# Patient Record
Sex: Male | Born: 1935 | Race: Black or African American | Hispanic: No | Marital: Married | Smoking: Former smoker
Health system: Southern US, Community
[De-identification: ages and names within clinical notes are randomized; demographics above are authoritative.]

## PROBLEM LIST (undated history)

## (undated) DIAGNOSIS — Z923 Personal history of irradiation: Secondary | ICD-10-CM

## (undated) DIAGNOSIS — I10 Essential (primary) hypertension: Secondary | ICD-10-CM

## (undated) DIAGNOSIS — K219 Gastro-esophageal reflux disease without esophagitis: Secondary | ICD-10-CM

## (undated) DIAGNOSIS — K08109 Complete loss of teeth, unspecified cause, unspecified class: Secondary | ICD-10-CM

## (undated) DIAGNOSIS — Z8546 Personal history of malignant neoplasm of prostate: Secondary | ICD-10-CM

## (undated) DIAGNOSIS — K222 Esophageal obstruction: Secondary | ICD-10-CM

## (undated) DIAGNOSIS — Z972 Presence of dental prosthetic device (complete) (partial): Secondary | ICD-10-CM

## (undated) DIAGNOSIS — J398 Other specified diseases of upper respiratory tract: Secondary | ICD-10-CM

## (undated) DIAGNOSIS — Z8521 Personal history of malignant neoplasm of larynx: Secondary | ICD-10-CM

## (undated) DIAGNOSIS — H269 Unspecified cataract: Secondary | ICD-10-CM

## (undated) HISTORY — DX: Gastro-esophageal reflux disease without esophagitis: K21.9

## (undated) HISTORY — PX: LIPOMA EXCISION: SHX5283

## (undated) HISTORY — PX: ESOPHAGEAL DILATION: SHX303

## (undated) HISTORY — PX: TRACHEAL ESOPHAGEAL PROSTHESIS (TEP) CHANGE: SHX6131

## (undated) HISTORY — PX: DIRECT LARYNGOSCOPY: SHX5326

## (undated) HISTORY — PX: TONSILLECTOMY: SUR1361

---

## 2003-09-28 ENCOUNTER — Inpatient Hospital Stay (HOSPITAL_COMMUNITY): Admission: EM | Admit: 2003-09-28 | Discharge: 2003-09-29 | Payer: Self-pay | Admitting: Emergency Medicine

## 2005-04-29 HISTORY — PX: PROSTATE BIOPSY: SHX241

## 2005-07-08 ENCOUNTER — Ambulatory Visit: Payer: Self-pay | Admitting: Internal Medicine

## 2005-10-29 ENCOUNTER — Ambulatory Visit: Admission: RE | Admit: 2005-10-29 | Discharge: 2005-11-29 | Payer: Self-pay | Admitting: Radiation Oncology

## 2006-07-04 ENCOUNTER — Ambulatory Visit: Payer: Self-pay | Admitting: Internal Medicine

## 2007-01-08 ENCOUNTER — Encounter: Admission: RE | Admit: 2007-01-08 | Discharge: 2007-01-08 | Payer: Self-pay | Admitting: Otolaryngology

## 2007-01-14 ENCOUNTER — Ambulatory Visit (HOSPITAL_COMMUNITY): Admission: RE | Admit: 2007-01-14 | Discharge: 2007-01-14 | Payer: Self-pay | Admitting: Otolaryngology

## 2007-02-06 ENCOUNTER — Ambulatory Visit: Payer: Self-pay | Admitting: Internal Medicine

## 2007-02-06 ENCOUNTER — Ambulatory Visit: Admission: RE | Admit: 2007-02-06 | Discharge: 2007-04-03 | Payer: Self-pay | Admitting: Radiation Oncology

## 2007-02-23 ENCOUNTER — Ambulatory Visit (HOSPITAL_BASED_OUTPATIENT_CLINIC_OR_DEPARTMENT_OTHER): Admission: RE | Admit: 2007-02-23 | Discharge: 2007-02-23 | Payer: Self-pay | Admitting: Otolaryngology

## 2007-02-23 ENCOUNTER — Encounter (INDEPENDENT_AMBULATORY_CARE_PROVIDER_SITE_OTHER): Payer: Self-pay | Admitting: Otolaryngology

## 2007-04-10 ENCOUNTER — Encounter (INDEPENDENT_AMBULATORY_CARE_PROVIDER_SITE_OTHER): Payer: Self-pay | Admitting: Otolaryngology

## 2007-04-10 ENCOUNTER — Ambulatory Visit (HOSPITAL_BASED_OUTPATIENT_CLINIC_OR_DEPARTMENT_OTHER): Admission: RE | Admit: 2007-04-10 | Discharge: 2007-04-10 | Payer: Self-pay | Admitting: Otolaryngology

## 2007-08-20 ENCOUNTER — Encounter: Payer: Self-pay | Admitting: Internal Medicine

## 2007-08-20 DIAGNOSIS — K219 Gastro-esophageal reflux disease without esophagitis: Secondary | ICD-10-CM | POA: Insufficient documentation

## 2007-08-20 DIAGNOSIS — N400 Enlarged prostate without lower urinary tract symptoms: Secondary | ICD-10-CM

## 2007-08-31 ENCOUNTER — Encounter: Payer: Self-pay | Admitting: Internal Medicine

## 2007-09-17 DIAGNOSIS — Z8521 Personal history of malignant neoplasm of larynx: Secondary | ICD-10-CM

## 2007-09-17 HISTORY — DX: Personal history of malignant neoplasm of larynx: Z85.21

## 2007-09-18 ENCOUNTER — Encounter: Payer: Self-pay | Admitting: Internal Medicine

## 2007-09-29 ENCOUNTER — Ambulatory Visit (HOSPITAL_COMMUNITY): Admission: RE | Admit: 2007-09-29 | Discharge: 2007-09-29 | Payer: Self-pay | Admitting: Otolaryngology

## 2007-10-28 ENCOUNTER — Inpatient Hospital Stay (HOSPITAL_COMMUNITY): Admission: RE | Admit: 2007-10-28 | Discharge: 2007-11-04 | Payer: Self-pay | Admitting: Otolaryngology

## 2007-10-28 ENCOUNTER — Encounter (INDEPENDENT_AMBULATORY_CARE_PROVIDER_SITE_OTHER): Payer: Self-pay | Admitting: Otolaryngology

## 2007-10-28 HISTORY — PX: TOTAL LARYNGECTOMY: SHX2543

## 2008-01-01 ENCOUNTER — Encounter: Payer: Self-pay | Admitting: Internal Medicine

## 2008-03-17 ENCOUNTER — Ambulatory Visit (HOSPITAL_COMMUNITY): Admission: RE | Admit: 2008-03-17 | Discharge: 2008-03-17 | Payer: Self-pay | Admitting: Otolaryngology

## 2008-04-07 ENCOUNTER — Ambulatory Visit (HOSPITAL_COMMUNITY): Admission: RE | Admit: 2008-04-07 | Discharge: 2008-04-07 | Payer: Self-pay | Admitting: Otolaryngology

## 2008-04-07 HISTORY — PX: PLACEMENT OF TRACHEAL ESOPHAGEAL PROTHESIS, ESOPHAGOSCOPY WITH DILATION: SHX5564

## 2008-05-27 ENCOUNTER — Ambulatory Visit (HOSPITAL_COMMUNITY): Admission: RE | Admit: 2008-05-27 | Discharge: 2008-05-27 | Payer: Self-pay | Admitting: Otolaryngology

## 2008-06-13 ENCOUNTER — Encounter: Payer: Self-pay | Admitting: Internal Medicine

## 2008-06-13 DIAGNOSIS — I10 Essential (primary) hypertension: Secondary | ICD-10-CM

## 2008-06-13 DIAGNOSIS — Z87891 Personal history of nicotine dependence: Secondary | ICD-10-CM | POA: Insufficient documentation

## 2008-06-20 ENCOUNTER — Encounter: Admission: RE | Admit: 2008-06-20 | Discharge: 2008-06-20 | Payer: Self-pay | Admitting: Internal Medicine

## 2008-06-24 ENCOUNTER — Ambulatory Visit (HOSPITAL_BASED_OUTPATIENT_CLINIC_OR_DEPARTMENT_OTHER): Admission: RE | Admit: 2008-06-24 | Discharge: 2008-06-24 | Payer: Self-pay | Admitting: Otolaryngology

## 2008-07-27 ENCOUNTER — Encounter: Payer: Self-pay | Admitting: Internal Medicine

## 2008-08-26 ENCOUNTER — Ambulatory Visit: Payer: Self-pay | Admitting: Internal Medicine

## 2008-08-26 DIAGNOSIS — R1319 Other dysphagia: Secondary | ICD-10-CM | POA: Insufficient documentation

## 2008-08-29 DIAGNOSIS — K5730 Diverticulosis of large intestine without perforation or abscess without bleeding: Secondary | ICD-10-CM | POA: Insufficient documentation

## 2008-08-29 DIAGNOSIS — J309 Allergic rhinitis, unspecified: Secondary | ICD-10-CM | POA: Insufficient documentation

## 2008-08-29 DIAGNOSIS — F411 Generalized anxiety disorder: Secondary | ICD-10-CM | POA: Insufficient documentation

## 2008-08-29 DIAGNOSIS — E785 Hyperlipidemia, unspecified: Secondary | ICD-10-CM | POA: Insufficient documentation

## 2008-08-29 DIAGNOSIS — I10 Essential (primary) hypertension: Secondary | ICD-10-CM | POA: Insufficient documentation

## 2008-08-29 DIAGNOSIS — Z8546 Personal history of malignant neoplasm of prostate: Secondary | ICD-10-CM

## 2008-09-14 ENCOUNTER — Ambulatory Visit: Payer: Self-pay | Admitting: Internal Medicine

## 2009-01-13 ENCOUNTER — Telehealth: Payer: Self-pay | Admitting: Gastroenterology

## 2009-03-13 ENCOUNTER — Encounter: Payer: Self-pay | Admitting: Internal Medicine

## 2009-06-27 ENCOUNTER — Ambulatory Visit: Payer: Self-pay | Admitting: Internal Medicine

## 2009-09-25 ENCOUNTER — Encounter: Payer: Self-pay | Admitting: Internal Medicine

## 2009-10-03 ENCOUNTER — Ambulatory Visit (HOSPITAL_COMMUNITY): Admission: RE | Admit: 2009-10-03 | Discharge: 2009-10-03 | Payer: Self-pay | Admitting: Urology

## 2009-10-09 ENCOUNTER — Encounter: Admission: RE | Admit: 2009-10-09 | Discharge: 2009-10-09 | Payer: Self-pay | Admitting: Urology

## 2009-10-12 ENCOUNTER — Encounter: Payer: Self-pay | Admitting: Internal Medicine

## 2009-10-18 ENCOUNTER — Encounter: Admission: RE | Admit: 2009-10-18 | Discharge: 2009-10-18 | Payer: Self-pay | Admitting: Urology

## 2009-11-15 ENCOUNTER — Encounter: Payer: Self-pay | Admitting: Internal Medicine

## 2009-11-27 ENCOUNTER — Ambulatory Visit: Admission: RE | Admit: 2009-11-27 | Discharge: 2009-12-06 | Payer: Self-pay | Admitting: Radiation Oncology

## 2009-11-28 ENCOUNTER — Encounter: Payer: Self-pay | Admitting: Internal Medicine

## 2009-12-04 ENCOUNTER — Ambulatory Visit (HOSPITAL_BASED_OUTPATIENT_CLINIC_OR_DEPARTMENT_OTHER): Admission: RE | Admit: 2009-12-04 | Discharge: 2009-12-04 | Payer: Self-pay | Admitting: Otolaryngology

## 2010-05-23 ENCOUNTER — Encounter: Payer: Self-pay | Admitting: Internal Medicine

## 2010-06-26 ENCOUNTER — Ambulatory Visit: Payer: Self-pay | Admitting: Internal Medicine

## 2010-10-07 ENCOUNTER — Encounter: Payer: Self-pay | Admitting: Urology

## 2010-10-18 NOTE — Letter (Signed)
Summary: [facility]  [facility]   Imported By: Lennie Odor 10/20/2009 12:03:18  _____________________________________________________________________  External Attachment:    Type:   Image     Comment:   External Document

## 2010-10-18 NOTE — Letter (Signed)
[facility]   Imported By: Lester Rosedale 01/03/2010 10:47:11  _____________________________________________________________________  External Attachment:    Type:   Image     Comment:   External Document

## 2010-10-18 NOTE — Assessment & Plan Note (Signed)
Summary: FLU SHOT / [name]  Nurse Visit   Allergies: No Known Drug Allergies  Orders Added: 1)  Flu Vaccine 4yrs + MEDICARE PATIENTS [Q2039] 2)  Administration Flu vaccine - MCR [G0008] Flu Vaccine Consent Questions     Do you have a history of severe allergic reactions to this vaccine? no    Any prior history of allergic reactions to egg and/or gelatin? no    Do you have a sensitivity to the preservative Thimersol? no    Do you have a past history of Guillan-Barre Syndrome? no    Do you currently have an acute febrile illness? no    Have you ever had a severe reaction to latex? no    Vaccine information given and explained to patient? yes    Are you currently pregnant? no    Lot Number:[id number]   Exp Date:03/16/2011   Site Given  Left Deltoid IMistration Flu vaccine - MCR [G0008] .lbmedflu

## 2010-10-18 NOTE — Letter (Signed)
Summary: [facility]   Imported By: Sherian Rein 05/28/2010 15:01:09  _____________________________________________________________________  External Attachment:    Type:   Image     Comment:   External Document

## 2010-10-18 NOTE — Letter (Signed)
Summary: [facility]   Imported By: Lennie Odor 11/23/2009 14:09:44  _____________________________________________________________________  External Attachment:    Type:   Image     Comment:   External Document

## 2010-10-18 NOTE — Letter (Signed)
Summary: [facility]   Imported By: Sherian Rein 09/28/2009 16:46:46  _____________________________________________________________________  External Attachment:    Type:   Image     Comment:   External Document

## 2010-12-05 ENCOUNTER — Encounter (HOSPITAL_BASED_OUTPATIENT_CLINIC_OR_DEPARTMENT_OTHER)
Admission: RE | Admit: 2010-12-05 | Discharge: 2010-12-05 | Disposition: A | Discharge status: Home / Self Care | Payer: Medicare Other | Source: Ambulatory Visit | Attending: Otolaryngology | Admitting: Otolaryngology

## 2010-12-05 ENCOUNTER — Ambulatory Visit
Admission: RE | Admit: 2010-12-05 | Discharge: 2010-12-05 | Disposition: A | Discharge status: Home / Self Care | Payer: Medicare Other | Source: Ambulatory Visit | Attending: Otolaryngology | Admitting: Otolaryngology

## 2010-12-05 ENCOUNTER — Other Ambulatory Visit: Payer: Self-pay | Admitting: Otolaryngology

## 2010-12-05 DIAGNOSIS — Z01811 Encounter for preprocedural respiratory examination: Secondary | ICD-10-CM

## 2010-12-05 LAB — POCT I-STAT, CHEM 8
BUN: 13 mg/dL (ref 6–23)
Calcium, Ion: 1.18 mmol/L (ref 1.12–1.32)
Chloride: 105 mEq/L (ref 96–112)
HCT: 45 % (ref 39.0–52.0)
Hemoglobin: 15.3 g/dL (ref 13.0–17.0)
Sodium: 141 mEq/L (ref 135–145)
TCO2: 26 mmol/L (ref 0–100)

## 2010-12-06 ENCOUNTER — Ambulatory Visit (HOSPITAL_BASED_OUTPATIENT_CLINIC_OR_DEPARTMENT_OTHER)
Admission: RE | Admit: 2010-12-06 | Discharge: 2010-12-06 | Disposition: A | Discharge status: Home / Self Care | Payer: Medicare Other | Source: Ambulatory Visit | Attending: Otolaryngology | Admitting: Otolaryngology

## 2010-12-06 DIAGNOSIS — Y849 Medical procedure, unspecified as the cause of abnormal reaction of the patient, or of later complication, without mention of misadventure at the time of the procedure: Secondary | ICD-10-CM | POA: Insufficient documentation

## 2010-12-06 DIAGNOSIS — Z01810 Encounter for preprocedural cardiovascular examination: Secondary | ICD-10-CM | POA: Insufficient documentation

## 2010-12-06 DIAGNOSIS — T85698A Other mechanical complication of other specified internal prosthetic devices, implants and grafts, initial encounter: Secondary | ICD-10-CM | POA: Insufficient documentation

## 2010-12-06 DIAGNOSIS — K222 Esophageal obstruction: Secondary | ICD-10-CM | POA: Insufficient documentation

## 2010-12-06 DIAGNOSIS — Z462 Encounter for fitting and adjustment of other devices related to nervous system and special senses: Secondary | ICD-10-CM | POA: Insufficient documentation

## 2010-12-06 LAB — POCT HEMOGLOBIN-HEMACUE: Hemoglobin: 15.6 g/dL (ref 13.0–17.0)

## 2010-12-09 LAB — BASIC METABOLIC PANEL
BUN: 10 mg/dL (ref 6–23)
Calcium: 8.9 mg/dL (ref 8.4–10.5)
Chloride: 107 mEq/L (ref 96–112)
Creatinine, Ser: 0.99 mg/dL (ref 0.4–1.5)
GFR calc Af Amer: >60 mL/min (ref >60)
GFR calc non Af Amer: >60 mL/min (ref >60)

## 2010-12-09 LAB — POCT HEMOGLOBIN-HEMACUE: Hemoglobin: 14.8 g/dL (ref 13.0–17.0)

## 2011-01-09 NOTE — Op Note (Signed)
[name], [name]              ACCOUNT NO.:  [id number]  MEDICAL RECORD NO.:  [id number]          PATIENT TYPE:  LOCATION:                                 FACILITY:  PHYSICIAN:  Kristine Garbe. Ezzard Standing, M.D.DATE OF BIRTH:  22-Jan-1931  DATE OF PROCEDURE:  12/06/2010 DATE OF DISCHARGE:                              OPERATIVE REPORT   PREOPERATIVE DIAGNOSES: 1. Esophageal stricture. 2. Alaryngeal speech with tracheoesophageal puncture.  POSTOPERATIVE DIAGNOSES: 1. Esophageal stricture. 2. Alaryngeal speech with tracheoesophageal puncture.  OPERATION PERFORMED:  Cervical esophagoscopy with dilation of cervical esophagus to a #18 Savary dilator.  Placement of a 10-mm Provox 2 voice prosthesis.  SURGEON:  Kristine Garbe. Ezzard Standing, MD  ANESTHESIA:  General endotracheal.  COMPLICATIONS:  None.  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  He has been using a voice prosthesis for a couple years now.  More recently, he has had some increasing difficulty using the voice prosthesis.  This was attempted to be replaced and could not be repositioned.  He was having no real leak through the tracheoesophageal fistula as if the back wall of the tracheoesophageal fistula is closing up over the prosthesis and could not pass the red rubber catheter through the fistula site.  The patient is taken to the operating room at this time for esophageal dilation and replacement of a Provox voice prosthesis through the tracheoesophageal fistula.  DESCRIPTION OF PROCEDURE:  After adequate endotracheal anesthesia through his laryngeal stoma, esophagoscopy was performed.  The patient had a stricture at the opening of the esophagus that was dilated starting with Savary dilators, initially using the #12 Savary dilator which was a little tight and this was sequentially dilated up to a #18 Savary dilator.  At this time, I was able to easily  pass the cervical esophagoscope.  With the area of the previous fistula under visualization, a hemostat was passed through a small hole and the fistula was dilated slightly and then a #12 red rubber catheter was passed through the hole which was brought out through the esophagoscope and brought to the mouth.  Measurements of the thickness of the common wall was estimated to be between 8 and 10 mm.  It was elected to use the #10 mm Provox prosthesis as he previously had an 8 in.  The Provox 10-mm prosthesis was then used and passed retrograde through the fistula site. Following placement of the prosthesis, repeat cervical esophagoscopy was performed and the esophageal side of this prosthesis was in good position and was not obstructing.  Of note, prior to placing the Provox prosthesis, cervical esophagoscopy was performed distal to the dilation and the more distal esophagus was normal in appearance.  This completed the procedure.  [name] was awakened from anesthesia and transferred to recovery room postop doing well.  He received 1 g Ancef IV preoperatively.  He is discharged home later this morning.  DISPOSITION:  [name] was discharged home on amoxicillin suspension 500 mg b.i.d. for 1 week, Tylenol Lortab elixir 15 mL q.4 h p.r.n. pain.  He will follow up with me in  the office in 1 week for recheck.  Of note, his laryngeal stoma was on the smaller side and recommended using the Lary tube to stent the laryngeal stoma.  He will follow my office in 1 week for recheck.          ______________________________ Kristine Garbe. Ezzard Standing, M.D.     CEN/MEDQ  D:  12/06/2010  T:  12/07/2010  Job:  [id number]  cc:   Jeffery Burly, MD  Electronically Signed by Dillard Cannon M.D. on 01/09/2011 12:55:22 PM

## 2011-01-29 NOTE — Op Note (Signed)
NAMEERNAN, RUNKLES              ACCOUNT NO.:  [id number]   MEDICAL RECORD NO.:  [id number]          PATIENT TYPE:  AMB   LOCATION:  DSC                          FACILITY:  [facility]   PHYSICIAN:  Christopher E. Ezzard Standing, M.D.DATE OF BIRTH:  [birth year]   DATE OF PROCEDURE:  06/24/2008  DATE OF DISCHARGE:                               OPERATIVE REPORT   PREOPERATIVE DIAGNOSIS:  Dysphagia with upper esophageal stricture.   POSTOPERATIVE DIAGNOSIS:  Dysphagia with upper esophageal stricture.   OPERATION:  Rigid esophagoscopy with dilation of upper cervical  esophagus with Savary dilators to #19.   SURGEON:  Kristine Garbe. Ezzard Standing, MD   ANESTHESIA:  General endotracheal.   COMPLICATIONS:  None.   CLINICAL NOTE:  [name] is a 77 year old gentleman who is status  post laryngectomy with tracheal stoma.  He also has a TEP prosthesis  that had a little bit difficulty functioning because of upper esophageal  stricture.  He has had some dysphagia because of the stricture.  He is  taken to the operating room at this time for dilation of upper cervical  esophageal stricture, status post laryngectomy.   DESCRIPTION OF PROCEDURE:  After adequate endotracheal anesthesia via  his tracheal stoma, the Provox  prosthesis that had been placed by the  speech therapist was removed.  The cervical esophagoscopy was performed,  and on initial pass, it was difficult to pass the cervical esophagoscope  past the stricture up high  which was above the prosthesis.  Starting  with the Savary dilators, the guidewire was passed without difficulty,  and starting with a #6 Savary dilator, he was dilated with a 6, 8, 10,  and 12 dilators.  Once we had about 12 or 13, he became a little bit  tighter.  He was sequentially dilated from dilator #13 up to #19 in  sequential fashion.  After dilating to a #19 Savary dilator, repeat  cervical esophagoscopy was performed, and the cervical esophagoscope  passed fairly  easily now.  There were several rents in mucosa proximally  just proximal to the TEP prosthesis.  At the end of procedure, a red  rubber catheter was passed through the TE fistula, and the prosthesis  was passed retrograde and repositioned.  It was in good position on  endoscopic exam internally as well as externally.  Of note, on removing  the prosthesis, there was a small tear at the base of the flange, on the  inner flange, but this was minimal.  I discussed this with the speech  therapist, and if he has any leaking via the prosthesis, they can  replace it with a new prosthesis if needed.  The patient was  subsequently awoken from anesthesia and transferred to the recovery  room, postop doing well.   DISPOSITION:  [name] is discharged home later this morning on  amoxicillin suspension 400 mg t.i.d. for 1 week, Tylenol or Lortab  elixir p.r.n. pain.  Follow up in my office in 2 weeks for recheck.  We  will follow up with speech therapist, [name] later today.  ______________________________  Kristine Garbe Ezzard Standing, M.D.     CEN/MEDQ  D:  06/24/2008  T:  06/24/2008  Job:  [id number]

## 2011-01-29 NOTE — Op Note (Signed)
NAMEPRABHJOT, [name]              ACCOUNT NO.:  [id number]   MEDICAL RECORD NO.:  [id number]          PATIENT TYPE:  AMB   LOCATION:  DSC                          FACILITY:  [facility]   PHYSICIAN:  Christopher E. Ezzard Standing, M.D.DATE OF BIRTH:  1930-12-23   DATE OF PROCEDURE:  04/10/2007  DATE OF DISCHARGE:                               OPERATIVE REPORT   PREOPERATIVE DIAGNOSIS:  Laryngeal lesion, rule out cancer.   POSTOPERATIVE DIAGNOSIS:  Laryngeal lesion, rule out cancer.   OPERATION:  Direct laryngoscopy and biopsy.   SURGEON:  Kristine Garbe. Ezzard Standing, M.D.   ANESTHESIA:  General endotracheal.   COMPLICATIONS:  None   SPECIMENS:  Biopsies were obtained from right true vocal cord, left true  and false vocal cord, and the subglottic area.   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  He underwent  primary treatment with radiation therapy at [facility].  He had some  persistent problems and underwent repeat direct laryngoscopy and biopsy  three months ago which showed some persistent cancer on the left false  cord.  They recommended laryngectomy, but he declined this and wanted  the area re-evaluated and presents to myself to discuss treatment  options.  He underwent a repeat biopsy a little over a month ago and  these biopsies were negative for any viable cancer.  He is taken back to  operating room at this time for repeat DL and multiple biopsies.   DESCRIPTION OF PROCEDURE:  After adequate endotracheal anesthesia,  direct laryngoscopy was performed.  The base of the tongue, vallecula,  and epiglottis were all normal.  The supraglottic structures appeared  clear. Actually, the false cords appeared relatively clear; however, on  evaluation of the true vocal cords, there were some inflammatory changes  as well as irregularity. 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  There was some  necrotic debris on the ulcer.  Photos were obtained.  Several biopsies  were obtained, initially from the right true vocal cord area and right  subglottic area, and then biopsies were obtained from the left true  vocal cord and left false cord, and then several biopsies were obtained  from the subglottic region.  Multiple biopsies were obtained from all  these areas.  Hemostasis was obtained with cotton pledgets soaked in  adrenaline.  This completed the procedure.  [name] was awakened from  anesthesia and transferred to the recovery room postop doing well.   DISPOSITION:  [name] is discharged home later this morning.  We will  keep him on Keflex 500 mg b.i.d. for next week in addition to Tylenol  and Vicodin p.r.n. pain.  Follow up in my office in six days for recheck  and to review pathology.           ______________________________  Kristine Garbe Ezzard Standing, M.D.     CEN/MEDQ  D:  04/10/2007  T:  04/10/2007  Job:  [id number] 

## 2011-01-29 NOTE — Op Note (Signed)
[name], [name]              ACCOUNT NO.:  [id number]   MEDICAL RECORD NO.:  [id number]          PATIENT TYPE:  AMB   LOCATION:  DSC                          FACILITY:  [facility]   PHYSICIAN:  Christopher E. Ezzard Standing, M.D.DATE OF BIRTH:  1931-08-12   DATE OF PROCEDURE:  02/23/2007  DATE OF DISCHARGE:                               OPERATIVE REPORT   PREOPERATIVE DIAGNOSIS:  Laryngeal cancer.   POSTOPERATIVE DIAGNOSIS:  Laryngeal cancer.   OPERATION/PROCEDURE:  Direct laryngoscopy and biopsy.   SURGEON:  Kristine Garbe. Ezzard Standing, M.D.   ANESTHESIA:  General endotracheal.   COMPLICATIONS:  None.   CLINICAL NOTE:  [name] is a 77 year old gentleman who was  originally diagnosed with laryngeal cancer at the [facility] in October  of last year.  He underwent primary treatment with radiation therapy,  but had persistent pain and hoarseness and on direct rebiopsy in April  of this year which demonstrated a persistent cancer.  He was recommended  a laryngectomy but did not want to have his laryngectomy done in [place].  He lived in [place] and presents here for another opinion.  His  original cancer was a right vocal cord cancer that extended  subglottically.  On the rebiopsy on April, the only positive biopsy was  left false cord region.  I discussed with [name] concerning need for  laryngectomy.  He is agreeable this but did want to make sure there was  cancer there and is taken back to the operating room time for repeat DL  with biopsy.   DESCRIPTION OF PROCEDURE:  After adequate endotracheal anesthesia,  direct laryngoscopy was performed.  The epiglottis and base of tongue  were clear.  Piriform sinuses were clear bilaterally.  On evaluation of  the cords, the false cords actually looked relatively clear.  There is  slight irregularity of the left false cord.  This was mostly mucosal  covered.  However, on evaluation of the true cords and subglottic area,  there was an  obvious ulcer anteriorly, extending subglottic about 1 cm  to 1.5 cm and some irregularity above the left and right true vocal  cords.  The subglottic area appeared to represent cancer,  was friable.  Several biopsies were obtained from the subglottic area as well as a  left right true vocal cords.  Cotton pledgets soaked in adrenalin were  placed for hemostasis.  This completed the procedure.  Photos were  obtained.  [name] was awakened from anesthesia and transferred to room  postop doing well.   DISPOSITION:  [name] is discharged home later this morning on his  regular medications as well as Lortab elixir 15-22.5 mL q.4 h p.r.n.  pain.  Followup in my office in 3 days to review pathology in his and he  will be can be tentatively scheduled for a laryngectomy in 2 weeks.           ______________________________  Kristine Garbe Ezzard Standing, M.D.     CEN/MEDQ  D:  02/23/2007  T:  02/23/2007  Job:  [id number]

## 2011-01-29 NOTE — Op Note (Signed)
NAMELAURENS, MATHENY              ACCOUNT NO.:  [id number]   MEDICAL RECORD NO.:  [id number]          PATIENT TYPE:  INP   LOCATION:  5128                         FACILITY:  [facility]   PHYSICIAN:  Kristine Garbe. Ezzard Standing, M.D.DATE OF BIRTH:  Jul 19, 1931   DATE OF PROCEDURE:  10/28/2007  DATE OF DISCHARGE:                               OPERATIVE REPORT   PREOPERATIVE DIAGNOSIS:  Laryngeal cancer.   POSTOPERATIVE DIAGNOSIS:  Laryngeal cancer.   OPERATION PERFORMED:  The total laryngectomy.   SURGEON:  Kristine Garbe. Ezzard Standing, MD   ASSISTANT SURGEON:  Hermelinda Medicus, MD.   ANESTHESIA:  General endotracheal.   COMPLICATIONS:  None.   ESTIMATED BLOOD LOSS:  150 mL.   BRIEF CLINICAL NOTE:  Gualberto is a 77 year old gentleman who was  initially diagnosed with laryngeal cancer and treated at the [facility]  in October of 2007.  He underwent primary treatment with radiation  therapy.  His original cancer involved the right vocal cord and extended  in the subglottic region.  Because of persistent problems, he had repeat  biopsies performed in April 2008, which showed persistent cancer and was  recommended laryngectomy at that time but declined laryngectomy.  He has  done fairly well, although he has been recommended the laryngectomy  several times since his a repeat diagnosis in April 2008.  He has  gradually gotten a little bit more hoarse and short of breath and at  this time has elected to have a laryngectomy performed.  He had a PET  scan, which shows no activity in lymph nodes in the neck but shows  activity in the glottis and subglottic region.  He was taken to the  operating room at this time for a total laryngectomy.   DESCRIPTION OF PROCEDURE:  After adequate endotracheal anesthesia, the  neck was prepped with Betadine solution and draped out with sterile  towels.  The proposed collar incision was marked out and injected with  Xylocaine with epinephrine for hemostasis.  The incision  was then made  and subplatysmal flaps elevated superiorly up to the hyoid bone and  inferiorly down to the collar bone.  Strap muscles were divided in the  midline and reflected laterally.  The thyroid was dissected off of the  trachea inferiorly.  Dissection was carried along the constrictor  muscles off the side of the laryngeal cartilage.  Superiorly the  attachments of the hyoid bone were released, the strap muscles were  released and the attachments of the tongue muscles to the hyoid bone  were released with cautery.  The superior thyroid vessels were ligated  with 2-0 silk sutures bilaterally.  This nicely freed up the larynx.  The larynx was entered superiorly, just superior and anterior to the  epiglottis.  The epiglottis was reflected inferiorly and the subglottic  area was visualized.  The subglottic area was clear of any disease.  The  AE folds all the tissue above the false cords was normal in appearance  with no evidence of cancer or ulceration.  The piriform sinus mucosa was  saved in the resection.  Next an incision was  made between the second  and third tracheal rings two rings below the cricoid cartilage.  The  trachea was entered inferiorly.  The gross tumor was visualized  extending down into the subglottic region.  Our entrance point was well  1-2 cm below the gross tumor.  The tracheal cuffs were then made, angled  posterior superiorly.  Mucosal cuts were made through the piriform sinus  and the esophagus was separated from the laryngeal cartilage and the  specimen was resected and sent to pathology.  Hemostasis was obtained  with the cautery and 3-0 silk ligatures.  After obtaining adequate  hemostasis, the remaining tracheal stump was secured to the skin  anteriorly with a single 2-0 silk suture and then the tracheal stoma was  created with interrupted 2-0 Vicryl sutures.  NG tube was passed through  the nose and down into the hypopharynx.  It was then passed down  into  the esophagus, down to the stomach.  This was secured later with tape to  the nose.  The hypopharyngeal mucosal defect was then closed with 3-0  chromic suture in a Connell-type fashion, inverting the mucosal edges.  It was closed predominantly in a horizontal-type fashion as we had a  significant amount of mucosa with a small defect.  After closing the  mucosal edges, a second layer of 3-0 chromic was placed and a few  interrupted silk sutures with strap muscles to reinforce the mucosal  closure was created.  Hemovac drain was brought through a separate stab  incision.  Skin flaps were closed with 3-0 chromic sutures  subcutaneously and staples on the skin and the posterior portion of the  tracheal stoma was closed with a 4-0 Vicryl suture.  This completed the  procedure.  The patient was awoken from anesthesia.  He was transferred  to the recovery room postop doing well.  Of note, he received 1 g Ancef  and 900 mg of Cleocin IV preoperatively.   DISPOSITION:  The patient will be admitted to the intensive care unit on  antibiotics, Ancef and Cleocin.           ______________________________  Kristine Garbe. Ezzard Standing, M.D.     CEN/MEDQ  D:  10/28/2007  T:  10/29/2007  Job:  [id number]   cc:   Hermelinda Medicus, M.D.

## 2011-01-29 NOTE — Op Note (Signed)
NAMEKAELUM, KISSICK              ACCOUNT NO.:  [id number]   MEDICAL RECORD NO.:  [id number]          PATIENT TYPE:  AMB   LOCATION:  SDS                          FACILITY:  [facility]   PHYSICIAN:  Kristine Garbe. Ezzard Standing, M.D.DATE OF BIRTH:  1931-01-30   DATE OF PROCEDURE:  DATE OF DISCHARGE:  04/07/2008                               OPERATIVE REPORT   PREOPERATIVE DIAGNOSIS:  Status post laryngectomy with alaryngeal  speech.   POSTOPERATIVE DIAGNOSIS:  Status post laryngectomy with alaryngeal  speech.   OPERATION:  Cervical esophagoscopy with placement of Provox 2  tracheoesophageal prosthesis.   SURGEON:  Kristine Garbe. Ezzard Standing, MD.   ANESTHESIA:  General endotracheal.   COMPLICATIONS:  None.   BRIEF CLINICAL NOTE:  Elian Gloster is a 77 year old gentleman who is  status post laryngectomy 6 months ago.  He has done well status post  laryngectomy.  He was taken to operating room at this time for placement  of a tracheoesophageal prosthesis for alaryngeal speech.  He has elected  to use the Provox 2 prosthesis and will follow up with Karren Burly  postoperatively for care and management of the prosthesis.   DESCRIPTION OF PROCEDURE:  After adequate endotracheal anesthesia via  his tracheal stoma, the area of the placement of the prosthesis was  marked out approximately 7-8 mm below the mucocutaneous junction of the  tracheal stoma in midline.  A small mark was made where the trocar was  to be positioned.  Next, a cervical esophagoscopy was performed.  It was  a little bit difficult to initially pass the cervical esophagoscope and  a small esophageal dilator was used to adequately locate the lumen and  the cervical esophagoscope was then passed following the dilator.  The  esophagoscope was brought back so that the light of the esophagoscope  could be visualized at the puncture site.  The Provox trocar was then  positioned at the previously marked puncture site and was passed  under  direct visualization into the cervical esophagoscope.  The guide was  then passed through the lumen of the trocar.  It was brought out through  the mouth.  The Provox 2 prosthesis was then hooked to the guide and  brought out through the puncture at the tracheal stoma and a flange was  passed through the puncture at the stoma.  Repeat cervical esophagoscopy  was performed to evaluate the inner flange of the prosthesis and this  appeared to be in good position.  There was minimal bleeding.  This  completed the procedure.  The Provox appeared to have a good position  inner and outer flanges.  There was minimal bleeding.  The patient was  subsequently awoke from anesthesia and transferred to recovery and  postoperatively doing well.   DISPOSITION:  Of note, the patient received 1 g Ancef IV preoperatively.   DISPOSITION:  The patient is discharged home later this morning.  The  patient's wife was given instructions on the care of the Provox along  with the care brush.  We will have him followup in my office in 1 week  for recheck.  He is scheduled to see speech pathology, Mr. Orvan Falconer in  the next day or two for use of the prosthesis.  He is also given  Tylenol, Lortab Elixir 15 mL q.4 h. p.r.n. pain, and amoxicillin  suspension 500 mg b.i.d. for 5 days.           ______________________________  Kristine Garbe Ezzard Standing, M.D.     CEN/MEDQ  D:  04/07/2008  T:  04/08/2008  Job:  [id number]

## 2011-02-01 NOTE — Discharge Summary (Signed)
[name], [name]                          ACCOUNT NO.:  [id number]   MEDICAL RECORD NO.:  [id number]                   PATIENT TYPE:  INP   LOCATION:  3028                                 FACILITY:  [facility]   PHYSICIAN:  [name], MD                 DATE OF BIRTH:  Feb 13, 1931   DATE OF ADMISSION:  09/28/2003  DATE OF DISCHARGE:  09/29/2003                                 DISCHARGE SUMMARY   ADMISSION DIAGNOSIS:  Painful left third nerve palsy.   DISCHARGE DIAGNOSES:  1. Left third nerve palsy of uncertain etiology, possibly from hypertension.  2. Borderline hypertension.   HISTORY OF PRESENT ILLNESS:  The patient is a pleasant 77 year old gentleman  who presented with sudden onset of left parietal and frontal headaches five  days prior to presentation.  The pain was unrelenting and resolved only  after four days.  The next day noticed vertigo, binocular diplopia, as well  as drooping of the left eyelid.  His headache returned and he initially went  to [employer]. Nile Riggs, M.D. and had a negative eye evaluation.  He was  subsequently referred to our office on an emergent basis and seen in the  office where he was found to have a partial left third nerve paresis with  pupil which was sluggishly reactive, but not dilated.  He had no other focal  findings on examination.  He was admitted emergently from the office for  evaluation for intracranial aneurysm or other pathology.  He underwent  diagnostic four vessel catheter angiogram by Sanjeev K. Deveshwar, M.D.  which did not reveal any aneurysm involving posterior communicating  arteries.  MRI scan of the brain and orbits was done with and without  contrast which was also normal without evidence of intra or retro orbital  pathology.  The patient states his headache resolved spontaneously and he  was prescribed Ultram but he did not get a chance to try it out.  He had  persistent left eye ptosis and partial weakness of the left  third nerve eye  movement and vertical diplopia.  The patient was found to have borderline  hypertension with blood pressure of 144/71 and 141/78 during  hospitalization.  His home medications were continued.   LABORATORY DATA:  ___________ 3 mm.  Hemoglobin A1C and lipid profile were  normal.  CBC, blood chemistries, and coagulation profile were also normal.  ANA and UA were ordered, but results are pending.   The patient was discharged home in stable condition and advised to resume  his home medications.   DISCHARGE MEDICATIONS:  1. Ultram 50 mg two tablets as needed for headache up to four times a day.  2. ProMax two tablets daily.  3. Omeprazole 20 mg daily.   FOLLOWUP:  He was advised to follow up with Corwin Levins, M.D. [facility], his  primary physician for evaluation and treatment for borderline hypertension.  He was asked to follow up with [name], M.D. in his office in  two months or call earlier if necessary.  If his diplopia and ptosis do not  improve over the next two to three months, he may be referred back to his  ophthalmologist for consideration for optic prisms.  He was advised to call  back if the headache returned or if he developed new symptoms.                                                [name], MD    PPS/MEDQ  D:  09/29/2003  T:  09/29/2003  Job:  [id number]   cc:   Corwin Levins, M.D. [facility]  [facility]  [address]  [place]  [place] [address]  Fax: [phone]

## 2011-02-01 NOTE — H&P (Signed)
[name], [name]                          ACCOUNT NO.:  [id number]   MEDICAL RECORD NO.:  [id number]                   PATIENT TYPE:  INP   LOCATION:  1826                                 FACILITY:  [facility]   PHYSICIAN:  [name], MD                 DATE OF BIRTH:  07-01-1931   DATE OF ADMISSION:  09/28/2003  DATE OF DISCHARGE:                                HISTORY & PHYSICAL   ADMISSION DIAGNOSES:  1. Left painful third nerve palsy.  2. Possible intracranial aneurysm.   HISTORY OF PRESENT ILLNESS:  The patient is a pleasant 77 year old gentleman  who presented with sudden onset of left orbital and frontal headache for the  last five days.  The patient states that his pain began on Friday night and  remained continuous for the next four days.  He described the pain as being  moderate to severe in intensity, throbbing in nature, mainly located behind  the eye.  It was continuous and did not fluctuate.  It was not accompanied  by nausea, vomiting photophobia or phonophobia.  There was no accompanied  condition on the eyes, redness or discharge.  He has not had any recent  cough, cold or sinus infection.  He was treated with Advil and allergy  medications which did not help.  He saw Dr. Loraine Leriche T. Nile Riggs, ophthalmologist  on Friday, and had a negative eye evaluation. His headache resolved on  Monday night, but Tuesday morning, he noted vertical diplopia and some  drooping of the left eyelid.  He was seen in ophthalmologist's office again  yesterday, Dr. Rubye Oaks, and though to have a Marcus Gunn pupil on the left.  He had no other complete focal neurological symptoms including blurred  vision, focal extremity weakness or numbness, gait or balance problems.  He  has no prior history of intracranial aneurysms or subarachnoid hemorrhage.  No prior history of stroke, TIA, migraines or significant neurological  problems.   PAST MEDICAL HISTORY:  Fairly unremarkable except for a  history of  gastroesophageal reflux disease and benign prostatic hypertrophy for which  he sees Dr. Annabell Howells.   FAMILY HISTORY:  Not significant for anybody with intracerebral hemorrhage  or intracranial aneurysms.   PAST SURGICAL HISTORY:  Benign tumor removed.   MEDICATION ALLERGIES:  None.   HOME MEDICATIONS:  1. Flomax 2 mg, two tablets daily.  2. Omeprazole 20 mg daily.   SOCIAL HISTORY:  The patient is retired.  He used to work for a tobacco  company.  He is married and living with his wife in [place].  He quit  smoking 14 years ago.  He drinks alcohol only occasionally.   REVIEW OF SYSTEMS:  No significant for any recent fever or loss of weight,  cough, diarrhea, rash, sinus infection or cold.  No chest pain,  palpitations, shortness of breath, cough, dysuria, memory problems or  diabetes.  PHYSICAL EXAMINATION:  GENERAL:  Reveals a pleasant African American, middle-  aged gentleman who is not in distress.  He is afebrile.  VITAL SIGNS:  Pulse rate is 70, respiratory rate is 16 per minute.  Blood  pressure 140/90.  PULSES:  Distal pulses are barely felt.  EXTREMITIES:  There is no extremity rash or deformity noted.  HEENT:  Head is nontraumatic.  ENT exam is unremarkable.  NECK:  Supple without bruits.  CARDIAC:  On exam, no murmur or gallop.  LUNGS:  Clear to auscultation.  NEUROLOGIC:  The patient is awake, alert, oriented x3 with normal speech and  language function.  There is no aphasia, apraxia or dysarthria.  Pupils are  both equal.  The right is briskly reactive.  The left reacts sluggishly, but  is not larger than the right.  I did not appreciate afferent pupillary  defect.  His eyes are disconjugate, and there is decreased movement of the  left eye in all directions except for left lateral rotation.  He, however,  does move the left eyeball.  He complains of vertical diplopia which  increased with upgaze and downgaze and improves with left-lateral gaze  only.  Funduscopic exam reveals sharp disk margins without any hemorrhages seen.  Visual acuity in the right eye is 10/25 and in the left eye is 20/400.  Face  is symmetric.  Bilateral __________.  Tongue is midline.  Motor system exam  reveals symmetric upper and lower extremity strength, tone, reflexes,  coordination and sensation.  He can walk with a steady gait including tandem  walking.   DATA REVIEWED:  Ophthalmology notes from Dr. Rubye Oaks dated yesterday were  reviewed.   IMPRESSION:  34. A 77 year old gentleman left orbital and frontal headache, head pain, as     well as left painful third nerve palsy.  Possibilities include left     posterior communicating tree aneurysm.  The patient will be admitted     emergently for urgent diagnostic cerebral catheter angiogram.  If     aneurysm is found, he may need treatment options like endovascular     coiling versus open surgical clipping.  2. If the angiogram does not show an aneurysm, then we will schedule an MRI     scan of the brain with and without contrast, with special cuts to the     orbits to look for retro orbital pathology, inflammation or tumor.  3. I explained the above plan to the patient and his wife and answered     questions.  My staff called the [facility] to arrange for urgent     hospital bed.  However, the hospital census was full.  Instead, the     patient will report to the emergency room where he will wait for a bed     and have catheter angiogram in the interim.  The above plan was discussed     with the patient and wife as well as with Dr. Corliss Skains, Interventional     Neuroradiologist, who will do the catheter angiogram.                                                [name], MD    PPS/MEDQ  D:  09/28/2003  T:  09/28/2003  Job:  [id number]   cc:   Rubye Oaks, Dr.  Laurita Quint, Dr.

## 2011-02-01 NOTE — Discharge Summary (Signed)
[name], [name]              ACCOUNT NO.:  [id number]   MEDICAL RECORD NO.:  [id number]          PATIENT TYPE:  INP   LOCATION:  5124                         FACILITY:  [facility]   PHYSICIAN:  Kristine Garbe. Ezzard Standing, M.D.DATE OF BIRTH:  07/28/1931   DATE OF ADMISSION:  10/28/2007  DATE OF DISCHARGE:  11/04/2007                               DISCHARGE SUMMARY   DIAGNOSIS:  Laryngeal cancer.   OPERATION:  Total laryngectomy on October 28, 2007.   CONSULTATION:  Speech therapy consult.   HOSPITAL COURSE:  The patient was admitted via the operating room on  October 28, 2007, at which time, he underwent a total laryngectomy for  laryngeal cancer.  Postoperatively, the patient received preoperative  antibiotics Ancef and Cleocin.  He was initially monitored in the  intensive care unit.  His nasogastric tube came out on the second  postoperative day accidentally and had a Panda tube placed by radiology.  On the second postoperative day, he was subsequently started on  nasogastric tube feedings and tolerated this well.  He had a low-grade  temperature first couple of days postoperatively, but subsequently  became afebrile by postop day #5.  His wounds were healing nicely.  The  Hemovac drains were removed on 3rd postoperative day.  Speech therapy  was consulted to see the patient on 6th postoperative day prior to  discharge.  He was subsequently started on p.o. diet on his 7th  postoperative day and was subsequently discharged home later that  evening.  At the time of discharge, the wounds were healing well.  He  was tolerating a p.o. diet well.   DISPOSITION:  The patient was discharged home on Lortab Elixir 15-20 mL  q.4 hours p.r.n. pain.  We will have him follow up at my office in 5  days for recheck and have remaining staples removed.   FINAL DIAGNOSIS:  Laryngeal cancer.           ______________________________  Kristine Garbe. Ezzard Standing, M.D.     CEN/MEDQ  D:  12/07/2007   T:  12/08/2007  Job:  [id number]

## 2011-02-01 NOTE — H&P (Signed)
[name], [name]              ACCOUNT NO.:  [id number]   MEDICAL RECORD NO.:  [id number]          PATIENT TYPE:  INP   LOCATION:  5124                         FACILITY:  [facility]   PHYSICIAN:  Kristine Garbe. Ezzard Standing, M.D.DATE OF BIRTH:  1931/08/28   DATE OF ADMISSION:  10/28/2007  DATE OF DISCHARGE:  11/04/2007                              HISTORY & PHYSICAL   REASON FOR ADMISSION:  Laryngeal cancer.   BRIEF HISTORY:  [name] is a 77 year old male who has had  previous history of laryngeal cancer status post radiation treatment  down in [place] in the [place] system.  He received radiation therapy about a  year and a half ago.  Following his primary treatment with radiation  therapy he had a biopsy done down in [place] about a year ago that showed  persistent cancer but did not want to have any further surgery or  treatment at that time.  He subsequently presented to [facility] for  another opinion and presented to my practice.  I have done DL and biopsy  which showed persistent cancer but the patient has been hesitant about  having his voice box removed.  His voice and breathing have gradually  gotten a little bit worse.  He has had a little bit more increasing pain  and now elects to have laryngectomy.  He has had PET scan which showed  no activity in any of the lymph nodes in the neck but significant  activity in the glottis and subglottic area consistent with persistent  cancer.  He is admitted to the hospital at this time for total  laryngectomy.   PAST MEDICAL HISTORY:  Significant for GE reflux disease as well as  history of prostate cancer and history of laryngeal cancer status post  primary treatment with radiation therapy a year and a half ago.   PRESENT MEDICATIONS:  1. Aspirin 81 mg daily.  2. Lisinopril 20 mg daily.  3. Omeprazole 20 mg daily.  4. Multivitamin and fish oil.  5. Tylenol with Codeine p.r.n. pain.   ALLERGIES:  No known drug allergies.   PHYSICAL  EXAMINATION:  HEAD AND NECK:  Neck reveals no palpable  adenopathy in the neck.  He has some mild edema from previous radiation  therapy.  On fiberoptic laryngoscopy he has poor mobility of the right  true vocal cord and some fullness in this region.  LUNGS:  Clear to auscultation.  CARDIAC:  Regular rate and rhythm without murmur.  ABDOMEN:  Soft and nontender.  NEUROLOGIC:  Intact.   IMPRESSION:  1. T3N0 laryngeal cancer, status post primary treatment with radiation      therapy.  2. History of prostate cancer.  3. GU reflux disease.  4. Hypertension.   PLAN:  The patient is admitted at this time for total laryngectomy.           ______________________________  Kristine Garbe Ezzard Standing, M.D.     CEN/MEDQ  D:  12/07/2007  T:  12/07/2007  Job:  [id number]

## 2011-06-03 ENCOUNTER — Ambulatory Visit (INDEPENDENT_AMBULATORY_CARE_PROVIDER_SITE_OTHER): Payer: Medicare Other

## 2011-06-03 DIAGNOSIS — Z23 Encounter for immunization: Secondary | ICD-10-CM

## 2011-06-07 LAB — URINALYSIS, ROUTINE W REFLEX MICROSCOPIC
Bilirubin Urine: NEGATIVE
Nitrite: NEGATIVE
Specific Gravity, Urine: 1.022
Urobilinogen, UA: 0.2
pH: 7

## 2011-06-07 LAB — CBC
HCT: 38.9 — ABNORMAL LOW
Platelets: 292
RDW: 13.9
WBC: 6.1

## 2011-06-07 LAB — COMPREHENSIVE METABOLIC PANEL
ALT: 25
AST: 24
Albumin: 3.5
Alkaline Phosphatase: 61
Chloride: 101
GFR calc Af Amer: >60
Potassium: 3.6
Sodium: 142
Total Bilirubin: 0.8
Total Protein: 6.8

## 2011-06-07 LAB — PROTIME-INR: INR: 0.9

## 2011-06-07 LAB — TYPE AND SCREEN: Antibody Screen: NEGATIVE

## 2011-06-14 LAB — BASIC METABOLIC PANEL
CO2: 29
Calcium: 9.3
Creatinine, Ser: 0.99
GFR calc Af Amer: >60
GFR calc non Af Amer: >60
Glucose, Bld: 102 — ABNORMAL HIGH
Sodium: 143

## 2011-06-14 LAB — APTT: aPTT: 46 — ABNORMAL HIGH

## 2011-06-14 LAB — PROTIME-INR
INR: 1
Prothrombin Time: 13.6

## 2011-06-14 LAB — CBC
MCHC: 33.1
RDW: 14.3

## 2011-06-17 LAB — BASIC METABOLIC PANEL
BUN: 6
Calcium: 9
Creatinine, Ser: 0.89
GFR calc non Af Amer: >60
Glucose, Bld: 130 — ABNORMAL HIGH

## 2011-06-17 LAB — POCT HEMOGLOBIN-HEMACUE: Hemoglobin: 15.2

## 2011-07-01 LAB — BASIC METABOLIC PANEL
BUN: 7
Calcium: 9.2
Creatinine, Ser: 0.88
GFR calc non Af Amer: >60
Glucose, Bld: 107 — ABNORMAL HIGH

## 2011-07-04 LAB — I-STAT 8, (EC8 V) (CONVERTED LAB)
BUN: 5 — ABNORMAL LOW
Bicarbonate: 28.1 — ABNORMAL HIGH
Glucose, Bld: 93
Hemoglobin: 13.6
Sodium: 141
pCO2, Ven: 45.9
pH, Ven: 7.395 — ABNORMAL HIGH

## 2011-07-04 LAB — POCT HEMOGLOBIN-HEMACUE: Hemoglobin: 13.9

## 2011-09-18 DIAGNOSIS — N138 Other obstructive and reflux uropathy: Secondary | ICD-10-CM | POA: Diagnosis not present

## 2011-09-18 DIAGNOSIS — C61 Malignant neoplasm of prostate: Secondary | ICD-10-CM | POA: Diagnosis not present

## 2011-09-18 DIAGNOSIS — R972 Elevated prostate specific antigen [PSA]: Secondary | ICD-10-CM | POA: Diagnosis not present

## 2011-09-18 DIAGNOSIS — N403 Nodular prostate with lower urinary tract symptoms: Secondary | ICD-10-CM | POA: Diagnosis not present

## 2011-11-03 ENCOUNTER — Encounter: Payer: Self-pay | Admitting: Internal Medicine

## 2011-11-03 DIAGNOSIS — Z0000 Encounter for general adult medical examination without abnormal findings: Secondary | ICD-10-CM | POA: Insufficient documentation

## 2011-11-05 ENCOUNTER — Ambulatory Visit (INDEPENDENT_AMBULATORY_CARE_PROVIDER_SITE_OTHER): Payer: Medicare Other | Admitting: Internal Medicine

## 2011-11-05 ENCOUNTER — Encounter: Payer: Self-pay | Admitting: Internal Medicine

## 2011-11-05 VITALS — BP 112/62 | HR 81 | Temp 97.3°F | Ht 69.0 in | Wt 206.1 lb

## 2011-11-05 DIAGNOSIS — I10 Essential (primary) hypertension: Secondary | ICD-10-CM | POA: Diagnosis not present

## 2011-11-05 DIAGNOSIS — C801 Malignant (primary) neoplasm, unspecified: Secondary | ICD-10-CM | POA: Insufficient documentation

## 2011-11-05 DIAGNOSIS — K219 Gastro-esophageal reflux disease without esophagitis: Secondary | ICD-10-CM | POA: Diagnosis not present

## 2011-11-05 DIAGNOSIS — R197 Diarrhea, unspecified: Secondary | ICD-10-CM

## 2011-11-05 DIAGNOSIS — R7309 Other abnormal glucose: Secondary | ICD-10-CM

## 2011-11-05 DIAGNOSIS — R7302 Impaired glucose tolerance (oral): Secondary | ICD-10-CM

## 2011-11-05 MED ORDER — ESOMEPRAZOLE MAGNESIUM 40 MG PO CPDR: 40.0000 mg | DELAYED_RELEASE_CAPSULE | Freq: Every day | ORAL | Status: DC

## 2011-11-05 NOTE — Patient Instructions (Signed)
Ok to stop the omeprazole Start the nexium 40 mg per day Continue all other medications as before Please keep your appointments with your specialists as you have [facility] No blood work needed today You are otherwise up to date with prevention Please return in 1 year for your yearly visit, or sooner if needed

## 2011-11-10 ENCOUNTER — Encounter: Payer: Self-pay | Admitting: Internal Medicine

## 2011-11-10 DIAGNOSIS — C329 Malignant neoplasm of larynx, unspecified: Secondary | ICD-10-CM | POA: Insufficient documentation

## 2011-11-10 DIAGNOSIS — R7302 Impaired glucose tolerance (oral): Secondary | ICD-10-CM | POA: Insufficient documentation

## 2011-11-10 DIAGNOSIS — R197 Diarrhea, unspecified: Secondary | ICD-10-CM | POA: Insufficient documentation

## 2011-11-10 NOTE — Assessment & Plan Note (Signed)
stable overall by hx and exam, most recent data reviewed with pt, and pt to continue medical treatment as before  BP Readings from Last 3 Encounters:  11/05/11 112/62  09/14/08 160/78  08/26/08 142/80

## 2011-11-10 NOTE — Assessment & Plan Note (Signed)
Asympt, [facility] reviewed and to be scanned to chart, no further eval needed,  to f/u any worsening symptoms or concerns

## 2011-11-10 NOTE — Assessment & Plan Note (Signed)
Exam benign, no blood, pain and mild symptoms, ok for metamucil otc prn, to f/u any worsening symptoms or concerns

## 2011-11-10 NOTE — Assessment & Plan Note (Signed)
Mild persistent symptoms, for change PPI to nexium 40 qd,  to f/u any worsening symptoms or concerns

## 2011-11-10 NOTE — Progress Notes (Signed)
  Subjective:    Patient ID: [name], male    DOB: [birth year], 78 y.o.   MRN: [id number]  HPI  Here to f/u;  Has mild worsening reflux symptoms despite omeprazole 20 bid and asks to change back to nexium 40 as this seems to do better, adn dneies dysphagia, abd pain, n/v, bowel change or blood. Does have occasional loose stool, dizziness and seems somewhat off balance occas to ambulate which he thinks not significiant, but wife mentions today.  Pt denies chest pain, increased sob or doe, wheezing, orthopnea, PND, increased LE swelling, palpitations, or syncope.  Pt denies new neurological symptoms such as new headache, or facial or extremity weakness or numbness   Pt denies polydipsia, polyuria.   Pt denies fever, wt loss, night sweats, loss of appetite, or other constitutional symptoms.   Past Medical History  Diagnosis Date  . Cancer     laryngeal cancer, Dr Marvetta Gibbons  . GERD (gastroesophageal reflux disease)   . PROSTATE CANCER, HX OF 08/29/2008  . Laryngeal cancer 11/10/2011  . HYPERLIPIDEMIA 08/29/2008  . ANXIETY 08/29/2008  . HYPERTENSION 08/29/2008  . ALLERGIC RHINITIS 08/29/2008  . GERD 08/20/2007  . DIVERTICULOSIS, COLON 08/29/2008  . BENIGN PROSTATIC HYPERTROPHY 08/20/2007  . History of prostatitis   . Impaired glucose tolerance   . UTI (lower urinary tract infection) 2005  . Diverticulosis   . Third nerve palsy 2005    painful, transient   Past Surgical History  Procedure Date  . Lipoma excision     abd wall  . Tonsillectomy     reports that he has quit smoking. He does not have any smokeless tobacco history on file. He reports that he does not drink alcohol or use illicit drugs. family history includes Hypertension in his father. No Known Allergies No current outpatient prescriptions on file prior to visit.   Review of Systems Review of Systems  Constitutional: Negative for diaphoresis and unexpected weight change.  HENT: Negative for drooling and tinnitus.     Eyes: Negative for photophobia and visual disturbance.  Respiratory: Negative for choking and stridor.   Gastrointestinal: Negative for vomiting and blood in stool.  Genitourinary: Negative for hematuria and decreased urine volume.     Objective:   Physical Exam BP 112/62  Pulse 81  Temp(Src) 97.3 F (36.3 C) (Oral)  Ht 5\' 9"  (1.753 m)  Wt 206 lb 2 oz (93.498 kg)  BMI 30.44 kg/m2  SpO2 98% Physical Exam  VS noted, thin, chronic ill appearing  HENT: Head: Normocephalic.  Right Ear: External ear normal.  Left Ear: External ear normal.  Eyes: Conjunctivae and EOM are normal. Pupils are equal, round, and reactive to light.  Neck: Normal range of motion. Neck supple.  Cardiovascular: Normal rate and regular rhythm.   Pulmonary/Chest: Effort normal and breath sounds normal.  Abd:  Soft, NT, non-distended, + BS Neurological: [name] alert. No cranial nerve deficit. gait intact Skin: Skin is warm. No erythema.  Psychiatric: Pt behavior is normal. Thought content normal.     Assessment & Plan:

## 2011-11-13 ENCOUNTER — Telehealth: Payer: Self-pay | Admitting: *Deleted

## 2011-11-13 MED ORDER — PANTOPRAZOLE SODIUM 40 MG PO TBEC: 40.0000 mg | DELAYED_RELEASE_TABLET | Freq: Every day | ORAL | Status: DC

## 2011-11-13 NOTE — Telephone Encounter (Signed)
Done hardcopy to robin  

## 2011-11-13 NOTE — Telephone Encounter (Signed)
[name] requesting callback regarding a prescription.

## 2011-11-13 NOTE — Telephone Encounter (Signed)
The patient took his Nexium prescription to the [facility] to be filled. They can only fill protonix, please advise is ok to change and MD at the [place] will do with PCP's ok. Please advise [phone]

## 2011-11-14 NOTE — Telephone Encounter (Signed)
Called the patient informed prescription requested is ready for pickup 

## 2011-12-10 ENCOUNTER — Other Ambulatory Visit (INDEPENDENT_AMBULATORY_CARE_PROVIDER_SITE_OTHER): Payer: Medicare Other

## 2011-12-10 ENCOUNTER — Encounter: Payer: Self-pay | Admitting: Internal Medicine

## 2011-12-10 ENCOUNTER — Ambulatory Visit (INDEPENDENT_AMBULATORY_CARE_PROVIDER_SITE_OTHER): Payer: Medicare Other | Admitting: Internal Medicine

## 2011-12-10 VITALS — BP 110/60 | HR 75 | Temp 97.1°F | Ht 69.0 in | Wt 201.1 lb

## 2011-12-10 DIAGNOSIS — K219 Gastro-esophageal reflux disease without esophagitis: Secondary | ICD-10-CM | POA: Diagnosis not present

## 2011-12-10 DIAGNOSIS — K921 Melena: Secondary | ICD-10-CM | POA: Insufficient documentation

## 2011-12-10 DIAGNOSIS — R109 Unspecified abdominal pain: Secondary | ICD-10-CM | POA: Insufficient documentation

## 2011-12-10 LAB — CBC WITH DIFFERENTIAL/PLATELET
Basophils Absolute: 0 10*3/uL (ref 0.0–0.1)
Basophils Relative: 0.2 % (ref 0.0–3.0)
Eosinophils Relative: 2.2 % (ref 0.0–5.0)
HCT: 42.2 % (ref 39.0–52.0)
Hemoglobin: 13.9 g/dL (ref 13.0–17.0)
Lymphocytes Relative: 11.2 % — ABNORMAL LOW (ref 12.0–46.0)
Lymphs Abs: 0.7 10*3/uL (ref 0.7–4.0)
Monocytes Relative: 10 % (ref 3.0–12.0)
Neutro Abs: 5 10*3/uL (ref 1.4–7.7)
RBC: 4.8 Mil/uL (ref 4.22–5.81)

## 2011-12-10 LAB — BASIC METABOLIC PANEL
CO2: 31 mEq/L (ref 19–32)
Calcium: 9 mg/dL (ref 8.4–10.5)
Creatinine, Ser: 1.1 mg/dL (ref 0.4–1.5)
GFR: 83.94 mL/min (ref >60.00)
Glucose, Bld: 89 mg/dL (ref 70–99)
Sodium: 140 mEq/L (ref 135–145)

## 2011-12-10 LAB — URINALYSIS, ROUTINE W REFLEX MICROSCOPIC
Bilirubin Urine: NEGATIVE
Hgb urine dipstick: NEGATIVE
Nitrite: NEGATIVE
Total Protein, Urine: NEGATIVE

## 2011-12-10 LAB — HEPATIC FUNCTION PANEL
Albumin: 3.8 g/dL (ref 3.5–5.2)
Total Protein: 7.3 g/dL (ref 6.0–8.3)

## 2011-12-10 NOTE — Progress Notes (Signed)
Subjective:    Patient ID: [name], male    DOB: 10/22/1930, 78 y.o.   MRN: [id number]  HPI  78yo BM, often receives care at the [place], but here today with c/o persistent reflux and mild mid abd discomfort despite PPI, admits to less than perfect antireflux diet, most particularly with rather large amount caffeinated tea per day.  1/2 bottle maalox per day for the last 3 months has helped, no dysphagia, n/v, bowel change or blood, except for small amount BRB recently.  Not due for colonscopy until next yr, but is interested in further eval if felt necessary.  Pt denies chest pain, increased sob or doe, wheezing, orthopnea, PND, increased LE swelling, palpitations, dizziness or syncope.  Pt denies new neurological symptoms such as new headache, or facial or extremity weakness or numbness   Pt denies polydipsia, polyuria. Past Medical History  Diagnosis Date  . Cancer     laryngeal cancer, Dr Marvetta Gibbons  . GERD (gastroesophageal reflux disease)   . PROSTATE CANCER, HX OF 08/29/2008  . Laryngeal cancer 11/10/2011  . HYPERLIPIDEMIA 08/29/2008  . ANXIETY 08/29/2008  . HYPERTENSION 08/29/2008  . ALLERGIC RHINITIS 08/29/2008  . GERD 08/20/2007  . DIVERTICULOSIS, COLON 08/29/2008  . BENIGN PROSTATIC HYPERTROPHY 08/20/2007  . History of prostatitis   . Impaired glucose tolerance   . UTI (lower urinary tract infection) 2005  . Diverticulosis   . Third nerve palsy 2005    painful, transient   Past Surgical History  Procedure Date  . Lipoma excision     abd wall  . Tonsillectomy     reports that he has quit smoking. He does not have any smokeless tobacco history on file. He reports that he does not drink alcohol or use illicit drugs. family history includes Hypertension in his father. No Known Allergies Current Outpatient Prescriptions on File Prior to Visit  Medication Sig Dispense Refill  . aspirin 81 MG tablet Take 81 mg by mouth daily.      Marland Kitchen lisinopril (PRINIVIL,ZESTRIL) 40 MG tablet  1/2 by mouth once daily      . loratadine (CLARITIN) 10 MG tablet Take 10 mg by mouth daily.      . Multiple Vitamin (MULTIVITAMIN) tablet Take 1 tablet by mouth daily.      . pantoprazole (PROTONIX) 40 MG tablet Take 1 tablet (40 mg total) by mouth daily.  90 tablet  3  . Saw Palmetto 1000 MG CAPS 5 by mouth daily      . vitamin B-12 (CYANOCOBALAMIN) 500 MCG tablet Take 500 mcg by mouth daily.      Marland Kitchen zolpidem (AMBIEN) 10 MG tablet Take 10 mg by mouth at bedtime as needed.       Review of Systems Review of Systems  Constitutional: Negative for diaphoresis and unexpected weight change.  HENT: Negative for drooling and tinnitus.   Eyes: Negative for photophobia and visual disturbance.  Respiratory: Negative for choking and stridor.    Genitourinary: Negative for hematuria and decreased urine volume  Objective:   Physical Exam BP 110/60  Pulse 75  Temp(Src) 97.1 F (36.2 C) (Oral)  Ht 5\' 9"  (1.753 m)  Wt 201 lb 2 oz (91.23 kg)  BMI 29.70 kg/m2  SpO2 97% Physical Exam  VS noted Constitutional: Pt appears well-developed and well-nourished.  HENT: Head: Normocephalic.  Right Ear: External ear normal.  Left Ear: External ear normal.  Eyes: Conjunctivae and EOM are normal. Pupils are equal, round, and reactive to light.  Neck: Normal range of motion. Neck supple.  Cardiovascular: Normal rate and regular rhythm.   Pulmonary/Chest: Effort normal and breath sounds normal.  Abd:  Soft, NT, non-distended, + BS Skin: Skin is warm. No erythema.  Psychiatric: Pt behavior is normal. Thought content normal. 1+ nervous    Assessment & Plan:

## 2011-12-10 NOTE — Assessment & Plan Note (Signed)
Subjective, exam bening, for labs today, to f/u any worsening symptoms or concerns

## 2011-12-10 NOTE — Assessment & Plan Note (Signed)
Small volume per pt, none today  - for GI referral, may need early colonoscopy

## 2011-12-10 NOTE — Assessment & Plan Note (Signed)
Rather severe by hx, on PPI, but drinks inordinate amount of caffeinated tea, urged to stop this, also refer GI , cont med, consider add OTC zantac prn, ,may need endocscopy for further eval

## 2011-12-10 NOTE — Patient Instructions (Signed)
Continue all other medications as before You can also use zantac OTC for better acid control Please stop all caffeine for now if possible You will be contacted regarding the referral for: GI Please go to LAB in the [facility]ement for the blood and/or urine tests to be done today You will be contacted by phone if any changes need to be made immediately.  Otherwise, you will receive a letter about your results with an explanation.

## 2011-12-12 ENCOUNTER — Encounter: Payer: Self-pay | Admitting: Gastroenterology

## 2011-12-18 ENCOUNTER — Encounter: Payer: Self-pay | Admitting: *Deleted

## 2011-12-24 ENCOUNTER — Ambulatory Visit (INDEPENDENT_AMBULATORY_CARE_PROVIDER_SITE_OTHER): Payer: Medicare Other | Admitting: Gastroenterology

## 2011-12-24 ENCOUNTER — Encounter: Payer: Self-pay | Admitting: Gastroenterology

## 2011-12-24 VITALS — BP 128/64 | HR 80 | Ht 69.0 in | Wt 202.0 lb

## 2011-12-24 DIAGNOSIS — K219 Gastro-esophageal reflux disease without esophagitis: Secondary | ICD-10-CM | POA: Diagnosis not present

## 2011-12-24 DIAGNOSIS — Z9889 Other specified postprocedural states: Secondary | ICD-10-CM | POA: Diagnosis not present

## 2011-12-24 DIAGNOSIS — Z9002 Acquired absence of larynx: Secondary | ICD-10-CM

## 2011-12-24 DIAGNOSIS — R1310 Dysphagia, unspecified: Secondary | ICD-10-CM

## 2011-12-24 MED ORDER — PANTOPRAZOLE SODIUM 40 MG PO TBEC: 40.0000 mg | DELAYED_RELEASE_TABLET | Freq: Two times a day (BID) | ORAL | Status: DC

## 2011-12-24 NOTE — Progress Notes (Signed)
History of Present Illness:  This is a very pleasant 78 year old African American male referred for evaluation of worsening acid reflux symptoms.  This patient has chronic GERD and has been on Prilosec for many years. Because of diarrhea he was recently switched to Protonix 40 mg the morning and Zantac 150 mg at bedtime. He has daytime and nighttime regurgitation and substernal burning without new-onset dysphagia. 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 He denies any hepatobiliary or lower gastrointestinal complaints except for loose stools with omeprazole. He denies rectal bleeding, has had negative Hemoccult cards, and had a negative colonoscopy in 2004. His weight is stable and he denies any specific food intolerances.  I have reviewed this patient's present history, medical and surgical past history, allergies and medications.     ROS: The remainder of the 10 point ROS is negative     Physical Exam: He has a voice box in his laryngeal area without any associated deformity or mass lesions. He can speak extremely well. Vital signs are all stable. Examination the oral pharyngeal area is unremarkable. General well developed well nourished patient in no acute distress, appearing his stated age Eyes PERRLA, no icterus, fundoscopic exam per opthamologist Skin no lesions noted Neck supple, no adenopathy, no thyroid enlargement, no tenderness Chest clear to percussion and auscultation Heart no significant murmurs, gallops or rubs noted Abdomen no hepatosplenomegaly masses or tendern digital exam. , BS normal. . Extremities no acute joint lesions, edema, phlebitis or evidence of cellulitis. Neurologic patient oriented x 3, cranial nerves intact, no focal neurologic deficits noted. Psychological mental status normal and normal affect.  Assessment  and plan: Chronic GERD in a patient with previous laryngeal carcinoma possibly related to chronic acid reflux. He is a poor candidate for conscious sedation, I would probably have to be done in the hospital with an anesthesiologist in attendance. Therefore, I've schedule barium swallow for review. I have increased his Protonix to 40 mg 30 minutes before breakfast and supper. He saw her patient education video on acid reflux in his management, and standard anti-reflux maneuvers reviewed with patient. He is a continue his other medications as per primary care.  Encounter Diagnoses  Name Primary?  . Esophageal reflux Yes  . Dysphagia

## 2011-12-24 NOTE — Progress Notes (Signed)
Addended by: Harlow Mares D on: 12/24/2011 09:02 AM   Modules accepted: Orders

## 2011-12-24 NOTE — Patient Instructions (Signed)
Your Barium Swallow is scheduled for 12/26/2011 at 10:30am, please arrive at 10:15am at [facility].  Today you watched a movie on Gerd in the office.  Please make an office visit to come back in 2-3 weeks.  Increase you Protonix to twice a day a new rx has been sent to your local pharmacy.     Gastroesophageal Reflux Disease, Adult Gastroesophageal reflux disease (GERD) happens when acid from your stomach flows up into the esophagus. When acid comes in contact with the esophagus, the acid causes soreness (inflammation) in the esophagus. Over time, GERD may create small holes (ulcers) in the lining of the esophagus. CAUSES   Increased body weight. This puts pressure on the stomach, making acid rise from the stomach into the esophagus.   Smoking. This increases acid production in the stomach.   Drinking alcohol. This causes decreased pressure in the lower esophageal sphincter (valve or ring of muscle between the esophagus and stomach), allowing acid from the stomach into the esophagus.   Late evening meals and a full stomach. This increases pressure and acid production in the stomach.   A malformed lower esophageal sphincter.  Sometimes, no cause is found. SYMPTOMS   Burning pain in the lower part of the mid-chest behind the breastbone and in the mid-stomach area. This may occur twice a week or more often.   Trouble swallowing.   Sore throat.   Dry cough.   Asthma-like symptoms including chest tightness, shortness of breath, or wheezing.  DIAGNOSIS  Your caregiver may be able to diagnose GERD based on your symptoms. In some cases, X-rays and other tests may be done to check for complications or to check the condition of your stomach and esophagus. TREATMENT  Your caregiver may recommend over-the-counter or prescription medicines to help decrease acid production. Ask your caregiver before starting or adding any new medicines.  HOME CARE INSTRUCTIONS   Change the factors  that you can control. Ask your caregiver for guidance concerning weight loss, quitting smoking, and alcohol consumption.   Avoid foods and drinks that make your symptoms worse, such as:   Caffeine or alcoholic drinks.   Chocolate.   Peppermint or mint flavorings.   Garlic and onions.   Spicy foods.   Citrus fruits, such as oranges, lemons, or limes.   Tomato-based foods such as sauce, chili, salsa, and pizza.   Fried and fatty foods.   Avoid lying down for the 3 hours prior to your bedtime or prior to taking a nap.   Eat small, frequent meals instead of large meals.   Wear loose-fitting clothing. Do not wear anything tight around your waist that causes pressure on your stomach.   Raise the head of your bed 6 to 8 inches with wood blocks to help you sleep. Extra pillows will not help.   Only take over-the-counter or prescription medicines for pain, discomfort, or fever as directed by your caregiver.   Do not take aspirin, ibuprofen, or other nonsteroidal anti-inflammatory drugs (NSAIDs).  SEEK IMMEDIATE MEDICAL CARE IF:   You have pain in your arms, neck, jaw, teeth, or back.   Your pain increases or changes in intensity or duration.   You develop nausea, vomiting, or sweating (diaphoresis).   You develop shortness of breath, or you faint.   Your vomit is green, yellow, black, or looks like coffee grounds or blood.   Your stool is red, bloody, or black.  These symptoms could be signs of other problems, such as heart  disease, gastric bleeding, or esophageal bleeding. MAKE SURE YOU:   Understand these instructions.   Will watch your condition.   Will get help right away if you are not doing well or get worse.  Document Released: 06/12/2005 Document Revised: 08/22/2011 Document Reviewed: 03/22/2011 [facility] Patient Information 2012 [place], [place].

## 2011-12-26 ENCOUNTER — Ambulatory Visit (HOSPITAL_COMMUNITY)
Admission: RE | Admit: 2011-12-26 | Discharge: 2011-12-26 | Disposition: A | Discharge status: Home / Self Care | Payer: Medicare Other | Source: Ambulatory Visit | Attending: Gastroenterology | Admitting: Gastroenterology

## 2011-12-26 DIAGNOSIS — K224 Dyskinesia of esophagus: Secondary | ICD-10-CM | POA: Diagnosis not present

## 2011-12-26 DIAGNOSIS — R1310 Dysphagia, unspecified: Secondary | ICD-10-CM | POA: Diagnosis not present

## 2011-12-26 DIAGNOSIS — K219 Gastro-esophageal reflux disease without esophagitis: Secondary | ICD-10-CM | POA: Diagnosis not present

## 2011-12-26 DIAGNOSIS — Z85819 Personal history of malignant neoplasm of unspecified site of lip, oral cavity, and pharynx: Secondary | ICD-10-CM | POA: Insufficient documentation

## 2011-12-27 ENCOUNTER — Other Ambulatory Visit: Payer: Self-pay | Admitting: Gastroenterology

## 2011-12-27 ENCOUNTER — Telehealth: Payer: Self-pay | Admitting: Gastroenterology

## 2011-12-27 NOTE — Telephone Encounter (Signed)
Pt advised and I will call himback later today with a date and time for his EGD with Dr Arlyce Dice at the hospital . See [facility]

## 2011-12-27 NOTE — Telephone Encounter (Signed)
I have faxed the recent Barium swallow and office visit note from Dr Jarold Motto to the [facility] if the have any further questions they can contact Dr Jarold Motto or myself.

## 2011-12-31 DIAGNOSIS — R599 Enlarged lymph nodes, unspecified: Secondary | ICD-10-CM | POA: Diagnosis not present

## 2011-12-31 DIAGNOSIS — R1310 Dysphagia, unspecified: Secondary | ICD-10-CM | POA: Diagnosis not present

## 2012-01-07 ENCOUNTER — Encounter (HOSPITAL_COMMUNITY): Admission: RE | Payer: Self-pay | Source: Ambulatory Visit

## 2012-01-07 ENCOUNTER — Encounter (HOSPITAL_BASED_OUTPATIENT_CLINIC_OR_DEPARTMENT_OTHER): Payer: Self-pay | Admitting: *Deleted

## 2012-01-07 ENCOUNTER — Ambulatory Visit (HOSPITAL_COMMUNITY): Admission: RE | Admit: 2012-01-07 | Payer: Medicare Other | Source: Ambulatory Visit | Admitting: Gastroenterology

## 2012-01-07 SURGERY — EGD (ESOPHAGOGASTRODUODENOSCOPY)
Anesthesia: Monitor Anesthesia Care

## 2012-01-07 NOTE — Progress Notes (Signed)
To come in for [id number]  

## 2012-01-08 ENCOUNTER — Encounter (HOSPITAL_BASED_OUTPATIENT_CLINIC_OR_DEPARTMENT_OTHER)
Admission: RE | Admit: 2012-01-08 | Discharge: 2012-01-08 | Disposition: A | Discharge status: Home / Self Care | Payer: Medicare Other | Source: Ambulatory Visit | Attending: Otolaryngology | Admitting: Otolaryngology

## 2012-01-08 DIAGNOSIS — D1739 Benign lipomatous neoplasm of skin and subcutaneous tissue of other sites: Secondary | ICD-10-CM | POA: Diagnosis not present

## 2012-01-08 DIAGNOSIS — R1310 Dysphagia, unspecified: Secondary | ICD-10-CM | POA: Diagnosis not present

## 2012-01-08 DIAGNOSIS — Z01810 Encounter for preprocedural cardiovascular examination: Secondary | ICD-10-CM | POA: Diagnosis not present

## 2012-01-08 DIAGNOSIS — Z930 Tracheostomy status: Secondary | ICD-10-CM | POA: Diagnosis not present

## 2012-01-08 DIAGNOSIS — E65 Localized adiposity: Secondary | ICD-10-CM | POA: Diagnosis not present

## 2012-01-08 DIAGNOSIS — K222 Esophageal obstruction: Secondary | ICD-10-CM | POA: Diagnosis not present

## 2012-01-08 LAB — BASIC METABOLIC PANEL
BUN: 13 mg/dL (ref 6–23)
Creatinine, Ser: 1.12 mg/dL (ref 0.50–1.35)
GFR calc Af Amer: 71 mL/min — ABNORMAL LOW (ref >90)
GFR calc non Af Amer: 61 mL/min — ABNORMAL LOW (ref >90)
Glucose, Bld: 98 mg/dL (ref 70–99)

## 2012-01-10 ENCOUNTER — Ambulatory Visit (HOSPITAL_BASED_OUTPATIENT_CLINIC_OR_DEPARTMENT_OTHER)
Admission: RE | Admit: 2012-01-10 | Discharge: 2012-01-10 | Disposition: A | Discharge status: Home / Self Care | Payer: Medicare Other | Source: Ambulatory Visit | Attending: Otolaryngology | Admitting: Otolaryngology

## 2012-01-10 ENCOUNTER — Encounter (HOSPITAL_BASED_OUTPATIENT_CLINIC_OR_DEPARTMENT_OTHER): Payer: Self-pay | Admitting: *Deleted

## 2012-01-10 ENCOUNTER — Ambulatory Visit (HOSPITAL_BASED_OUTPATIENT_CLINIC_OR_DEPARTMENT_OTHER): Payer: Medicare Other | Admitting: Anesthesiology

## 2012-01-10 ENCOUNTER — Encounter (HOSPITAL_BASED_OUTPATIENT_CLINIC_OR_DEPARTMENT_OTHER)
Admission: RE | Disposition: A | Discharge status: Home / Self Care | Payer: Self-pay | Source: Ambulatory Visit | Attending: Otolaryngology

## 2012-01-10 ENCOUNTER — Encounter (HOSPITAL_BASED_OUTPATIENT_CLINIC_OR_DEPARTMENT_OTHER): Payer: Self-pay | Admitting: Anesthesiology

## 2012-01-10 DIAGNOSIS — R220 Localized swelling, mass and lump, head: Secondary | ICD-10-CM | POA: Diagnosis not present

## 2012-01-10 DIAGNOSIS — K222 Esophageal obstruction: Secondary | ICD-10-CM | POA: Diagnosis not present

## 2012-01-10 DIAGNOSIS — D1739 Benign lipomatous neoplasm of skin and subcutaneous tissue of other sites: Secondary | ICD-10-CM | POA: Diagnosis not present

## 2012-01-10 DIAGNOSIS — R1310 Dysphagia, unspecified: Secondary | ICD-10-CM | POA: Insufficient documentation

## 2012-01-10 DIAGNOSIS — Z930 Tracheostomy status: Secondary | ICD-10-CM | POA: Diagnosis not present

## 2012-01-10 DIAGNOSIS — R221 Localized swelling, mass and lump, neck: Secondary | ICD-10-CM | POA: Diagnosis not present

## 2012-01-10 DIAGNOSIS — E65 Localized adiposity: Secondary | ICD-10-CM | POA: Insufficient documentation

## 2012-01-10 DIAGNOSIS — Z01810 Encounter for preprocedural cardiovascular examination: Secondary | ICD-10-CM | POA: Insufficient documentation

## 2012-01-10 HISTORY — PX: MASS EXCISION: SHX2000

## 2012-01-10 HISTORY — PX: NECK SURGERY: SHX720

## 2012-01-10 SURGERY — ESOPHAGOSCOPY, WITH DILATION
Anesthesia: General | Site: Throat | Wound class: Clean Contaminated

## 2012-01-10 MED ORDER — MIDAZOLAM HCL 2 MG/2ML IJ SOLN: 1.0000 mg | INTRAMUSCULAR | Status: DC | PRN

## 2012-01-10 MED ORDER — LORAZEPAM 2 MG/ML IJ SOLN: 1.0000 mg | Freq: Once | INTRAMUSCULAR | Status: DC | PRN

## 2012-01-10 MED ORDER — CEFAZOLIN SODIUM 1-5 GM-% IV SOLN
INTRAVENOUS | Status: DC | PRN
  Administered 2012-01-10: 1 g via INTRAVENOUS

## 2012-01-10 MED ORDER — LIDOCAINE HCL (CARDIAC) 20 MG/ML IV SOLN
INTRAVENOUS | Status: DC | PRN
  Administered 2012-01-10: 75 mg via INTRAVENOUS

## 2012-01-10 MED ORDER — PHENYLEPHRINE HCL 10 MG/ML IJ SOLN
10.0000 mg | INTRAVENOUS | Status: DC | PRN
  Administered 2012-01-10: 40 ug/min via INTRAVENOUS

## 2012-01-10 MED ORDER — EPHEDRINE SULFATE 50 MG/ML IJ SOLN
INTRAMUSCULAR | Status: DC | PRN
  Administered 2012-01-10 (×2): 10 mg via INTRAVENOUS
  Administered 2012-01-10: 5 mg via INTRAVENOUS
  Administered 2012-01-10: 10 mg via INTRAVENOUS

## 2012-01-10 MED ORDER — DEXAMETHASONE SODIUM PHOSPHATE 4 MG/ML IJ SOLN
INTRAMUSCULAR | Status: DC | PRN
  Administered 2012-01-10: 4 mg via INTRAVENOUS

## 2012-01-10 MED ORDER — LIDOCAINE-EPINEPHRINE 1 %-1:100000 IJ SOLN
INTRAMUSCULAR | Status: DC | PRN
  Administered 2012-01-10: 4 mL

## 2012-01-10 MED ORDER — PHENYLEPHRINE HCL 10 MG/ML IJ SOLN
INTRAMUSCULAR | Status: DC | PRN
  Administered 2012-01-10 (×2): 80 ug via INTRAVENOUS

## 2012-01-10 MED ORDER — FENTANYL CITRATE 0.05 MG/ML IJ SOLN: 50.0000 ug | INTRAMUSCULAR | Status: DC | PRN

## 2012-01-10 MED ORDER — CEPHALEXIN 500 MG PO CAPS: 500.0000 mg | ORAL_CAPSULE | Freq: Two times a day (BID) | ORAL | Status: AC

## 2012-01-10 MED ORDER — HYDROCODONE-ACETAMINOPHEN 5-500 MG PO TABS: 1.0000 | ORAL_TABLET | Freq: Four times a day (QID) | ORAL | Status: AC | PRN

## 2012-01-10 MED ORDER — ONDANSETRON HCL 4 MG/2ML IJ SOLN
INTRAMUSCULAR | Status: DC | PRN
  Administered 2012-01-10: 4 mg via INTRAVENOUS

## 2012-01-10 MED ORDER — PROPOFOL 10 MG/ML IV EMUL
INTRAVENOUS | Status: DC | PRN
  Administered 2012-01-10: 200 mg via INTRAVENOUS

## 2012-01-10 MED ORDER — LACTATED RINGERS IV SOLN
INTRAVENOUS | Status: DC
  Administered 2012-01-10 (×2): via INTRAVENOUS

## 2012-01-10 MED ORDER — SUCCINYLCHOLINE CHLORIDE 20 MG/ML IJ SOLN
INTRAMUSCULAR | Status: DC | PRN
Start: 2012-01-10 — End: 2012-01-10
  Administered 2012-01-10: 100 mg via INTRAVENOUS

## 2012-01-10 MED ORDER — FENTANYL CITRATE 0.05 MG/ML IJ SOLN
INTRAMUSCULAR | Status: DC | PRN
  Administered 2012-01-10 (×2): 25 ug via INTRAVENOUS

## 2012-01-10 MED ORDER — HYDROMORPHONE HCL PF 1 MG/ML IJ SOLN: 0.2500 mg | INTRAMUSCULAR | Status: DC | PRN

## 2012-01-10 MED ORDER — MIDAZOLAM HCL 5 MG/5ML IJ SOLN
INTRAMUSCULAR | Status: DC | PRN
  Administered 2012-01-10: 1 mg via INTRAVENOUS

## 2012-01-10 SURGICAL SUPPLY — 72 items
7218-01 (Prosthesis) ×1 IMPLANT
ADH SKN CLS APL DERMABOND .7 (GAUZE/BANDAGES/DRESSINGS)
APL SKNCLS STERI-STRIP NONHPOA (GAUZE/BANDAGES/DRESSINGS)
BENZOIN TINCTURE PRP APPL 2/3 (GAUZE/BANDAGES/DRESSINGS) IMPLANT
BLADE SURG 15 STRL LF DISP TIS (BLADE) ×2 IMPLANT
BLADE SURG 15 STRL SS (BLADE) ×3
CANISTER SUCTION 1200CC (MISCELLANEOUS) ×3 IMPLANT
CATH ROBINSON RED A/P 14FR (CATHETERS) ×2 IMPLANT
CLEANER CAUTERY TIP 5X5 PAD (MISCELLANEOUS) IMPLANT
CLOTH BEACON ORANGE TIMEOUT ST (SAFETY) ×3 IMPLANT
CORDS BIPOLAR (ELECTRODE) IMPLANT
COVER MAYO STAND STRL (DRAPES) ×3 IMPLANT
COVER TABLE BACK 60X90 (DRAPES) ×3 IMPLANT
DECANTER SPIKE VIAL GLASS SM (MISCELLANEOUS) IMPLANT
DERMABOND ADVANCED (GAUZE/BANDAGES/DRESSINGS)
DERMABOND ADVANCED .7 DNX12 (GAUZE/BANDAGES/DRESSINGS) IMPLANT
DRAPE U-SHAPE 76X120 STRL (DRAPES) ×3 IMPLANT
ELECT COATED BLADE 2.86 ST (ELECTRODE) ×3 IMPLANT
ELECT REM PT RETURN 9FT ADLT (ELECTROSURGICAL) ×3
ELECTRODE REM PT RTRN 9FT ADLT (ELECTROSURGICAL) ×2 IMPLANT
GAUZE SPONGE 4X4 12PLY STRL LF (GAUZE/BANDAGES/DRESSINGS) ×5 IMPLANT
GAUZE SPONGE 4X4 16PLY XRAY LF (GAUZE/BANDAGES/DRESSINGS) IMPLANT
GLOVE BIO SURGEON STRL SZ 6.5 (GLOVE) ×2 IMPLANT
GLOVE BIOGEL PI IND STRL 7.5 (GLOVE) IMPLANT
GLOVE BIOGEL PI INDICATOR 7.5 (GLOVE) ×1
GLOVE ECLIPSE 6.5 STRL STRAW (GLOVE) ×1 IMPLANT
GLOVE SKINSENSE NS SZ7.0 (GLOVE) ×2
GLOVE SKINSENSE STRL SZ7.0 (GLOVE) ×2 IMPLANT
GLOVE SS BIOGEL STRL SZ 7.5 (GLOVE) ×2 IMPLANT
GLOVE SS BIOGEL STRL SZ 8 (GLOVE) IMPLANT
GLOVE SUPERSENSE BIOGEL SZ 7.5 (GLOVE) ×1
GLOVE SUPERSENSE BIOGEL SZ 8 (GLOVE) ×1
GOWN PREVENTION PLUS XLARGE (GOWN DISPOSABLE) ×7 IMPLANT
GOWN PREVENTION PLUS XXLARGE (GOWN DISPOSABLE) IMPLANT
GUARD TEETH (MISCELLANEOUS) IMPLANT
HEMOSTAT SURGICEL .5X2 ABSORB (HEMOSTASIS) IMPLANT
NDL HYPO 25X1 1.5 SAFETY (NEEDLE) IMPLANT
NEEDLE HYPO 25X1 1.5 SAFETY (NEEDLE) IMPLANT
NS IRRIG 1000ML POUR BTL (IV SOLUTION) ×3 IMPLANT
PACK BASIN DAY SURGERY FS (CUSTOM PROCEDURE TRAY) ×3 IMPLANT
PAD CLEANER CAUTERY TIP 5X5 (MISCELLANEOUS)
PATTIES SURGICAL .5 X3 (DISPOSABLE) IMPLANT
PENCIL BUTTON HOLSTER BLD 10FT (ELECTRODE) ×3 IMPLANT
SHEET MEDIUM DRAPE 40X70 STRL (DRAPES) ×3 IMPLANT
SLEEVE SCD COMPRESS KNEE MED (MISCELLANEOUS) ×3 IMPLANT
SPONGE INTESTINAL PEANUT (DISPOSABLE) IMPLANT
STRIP CLOSURE SKIN 1/2X4 (GAUZE/BANDAGES/DRESSINGS) IMPLANT
STRIP CLOSURE SKIN 1/4X4 (GAUZE/BANDAGES/DRESSINGS) IMPLANT
SUCTION FRAZIER TIP 10 FR DISP (SUCTIONS) IMPLANT
SURGILUBE 2OZ TUBE FLIPTOP (MISCELLANEOUS) ×3 IMPLANT
SUT CHROMIC 3 0 PS 2 (SUTURE) ×3 IMPLANT
SUT CHROMIC 3 0 SH 27 (SUTURE) IMPLANT
SUT CHROMIC 3 0 TIES (SUTURE) ×3 IMPLANT
SUT ETHILON 4 0 PS 2 18 (SUTURE) IMPLANT
SUT ETHILON 5 0 P 3 18 (SUTURE) ×1
SUT NYLON ETHILON 5-0 P-3 1X18 (SUTURE) ×2 IMPLANT
SUT SILK 2 0 SH (SUTURE) ×2 IMPLANT
SUT SILK 2 0 TIES 17X18 (SUTURE)
SUT SILK 2-0 18XBRD TIE BLK (SUTURE) IMPLANT
SUT SILK 3 0 SH 30 (SUTURE) IMPLANT
SUT SILK 3 0 TIES 17X18 (SUTURE)
SUT SILK 3-0 18XBRD TIE BLK (SUTURE) IMPLANT
SUT VIC AB 5-0 P-3 18X BRD (SUTURE) IMPLANT
SUT VIC AB 5-0 P3 18 (SUTURE)
SWAB CULTURE LIQ STUART DBL (MISCELLANEOUS) IMPLANT
SYR BULB 3OZ (MISCELLANEOUS) ×3 IMPLANT
SYR CONTROL 10ML LL (SYRINGE) IMPLANT
TOWEL OR 17X24 6PK STRL BLUE (TOWEL DISPOSABLE) ×6 IMPLANT
TRAY DSU PREP LF (CUSTOM PROCEDURE TRAY) ×3 IMPLANT
TUBE ANAEROBIC SPECIMEN COL (MISCELLANEOUS) IMPLANT
TUBE CONNECTING 20X1/4 (TUBING) ×4 IMPLANT
WATER STERILE IRR 1000ML POUR (IV SOLUTION) ×2 IMPLANT

## 2012-01-10 NOTE — Anesthesia Preprocedure Evaluation (Addendum)
Anesthesia Evaluation  Patient identified by MRN, date of birth, ID band Patient awake    Reviewed: Allergy & Precautions, H&P , NPO status , Patient's Chart, lab work & pertinent test results  Airway Mallampati: II TM Distance: >3 FB Neck ROM: Full    Dental   Pulmonary former smoker Laryngeal ca-s/p total laryngectomy Trach   Pulmonary exam normal       Cardiovascular hypertension,     Neuro/Psych Anxiety    GI/Hepatic GERD-  ,  Endo/Other    Renal/GU      Musculoskeletal   Abdominal   Peds  Hematology   Anesthesia Other Findings trach  Reproductive/Obstetrics                          Anesthesia Physical Anesthesia Plan  ASA: III  Anesthesia Plan: General   Post-op Pain Management:    Induction: Intravenous  Airway Management Planned: Oral ETT  Additional Equipment:   Intra-op Plan:   Post-operative Plan: Extubation in OR  Informed Consent: I have reviewed the patients History and Physical, chart, labs and discussed the procedure including the risks, benefits and alternatives for the proposed anesthesia with the patient or authorized representative who has indicated his/her understanding and acceptance.     Plan Discussed with: CRNA and Surgeon  Anesthesia Plan Comments: (OET tube into trach for ventilation)       Anesthesia Quick Evaluation

## 2012-01-10 NOTE — Anesthesia Postprocedure Evaluation (Signed)
  Anesthesia Post-op Note  Patient: [name]  Procedure(s) Performed: Procedure(s) (LRB): ESOPHAGOSCOPY WITH DILITATION (N/A) EXCISION MASS (N/A)  Patient Location: PACU  Anesthesia Type: General  Level of Consciousness: awake  Airway and Oxygen Therapy: Patient Spontanous Breathing  Post-op Pain: mild  Post-op Assessment: Post-op Vital signs reviewed, Patient's Cardiovascular Status Stable, Respiratory Function Stable, Patent Airway, No signs of Nausea or vomiting and Pain level controlled  Post-op Vital Signs: stable  Complications: No apparent anesthesia complications

## 2012-01-10 NOTE — Anesthesia Procedure Notes (Signed)
Procedure Name: Intubation Date/Time: 01/10/2012 8:28 AM Performed by: Zenia Resides D Pre-anesthesia Checklist: Patient identified, Timeout performed, Emergency Drugs available, Suction available and Patient being monitored Patient Re-evaluated:Patient Re-evaluated prior to inductionOxygen Delivery Method: Circle system utilized Preoxygenation: Pre-oxygenation with 100% oxygen Intubation Type: IV induction and Tracheostomy Tube type: Oral Tube size: 7.0 mm ETT to lip (cm): just above cuff. ett inserted through existing stoma.

## 2012-01-10 NOTE — Brief Op Note (Signed)
01/10/2012  9:47 AM  PATIENT:  [name]  78 y.o. male  PRE-OPERATIVE DIAGNOSIS:  esophageal stricture, posterior right neck mass  POST-OPERATIVE DIAGNOSIS:  esophageal stricture, posterior neck mass  PROCEDURE:  Procedure(s) (LRB): ESOPHAGOSCOPY WITH DILITATION (N/A) to 19 mm Savory Dilator EXCISION MASS (N/A) POSTERIOR R NECK (3-4 cm)  SURGEON:  Surgeon(s) and Role:    * Drema Halon, MD - Primary  PHYSICIAN ASSISTANT:   ASSISTANTS: none   ANESTHESIA:   general  EBL:  Total I/O In: 1000 [I.V.:1000] Out: -   BLOOD ADMINISTERED:none  DRAINS: none   LOCAL MEDICATIONS USED:  XYLOCAINE with epi 4 cc  SPECIMEN:  Source of Specimen:  posterior neck mass  DISPOSITION OF SPECIMEN:  PATHOLOGY  COUNTS:  YES  TOURNIQUET:  * No tourniquets in log *  DICTATION: .Other Dictation: Dictation Number [id number]  PLAN OF CARE: Discharge to home after PACU  PATIENT DISPOSITION:  PACU - hemodynamically stable.   Delay start of Pharmacological VTE agent (>24hrs) due to surgical blood loss or risk of bleeding: not applicable

## 2012-01-10 NOTE — Transfer of Care (Signed)
Immediate Anesthesia Transfer of Care Note  Patient: [name]  Procedure(s) Performed: Procedure(s) (LRB): ESOPHAGOSCOPY WITH DILITATION (N/A) EXCISION MASS (N/A)  Patient Location: PACU  Anesthesia Type: General  Level of Consciousness: awake  Airway & Oxygen Therapy: Patient Spontanous Breathing and aerosol face mask  Post-op Assessment: Report given to PACU RN and Post -op Vital signs reviewed and stable  Post vital signs: Reviewed and stable  Complications: No apparent anesthesia complications

## 2012-01-10 NOTE — H&P (Signed)
PREOPERATIVE H&P  Chief Complaint: trouble swallowing  HPI: [name] is a 78 y.o. male who presents for evaluation of trouble swallowing with esophageal stricture for dilation. He also has a mass on posterior neck he would like removed.  Past Medical History  Diagnosis Date  . Laryngeal cancer      Dr Marvetta Gibbons  . GERD (gastroesophageal reflux disease)   . Adenocarcinoma of prostate, stage 1   . HYPERLIPIDEMIA   . ANXIETY   . HYPERTENSION   . ALLERGIC RHINITIS   . GERD   . DIVERTICULOSIS, COLON   . BENIGN PROSTATIC HYPERTROPHY   . History of prostatitis   . Impaired glucose tolerance   . UTI (lower urinary tract infection) 2005  . Diverticulosis   . Third nerve palsy 2005    painful, transient  . Esophageal stricture   . Squamous cell carcinoma    Past Surgical History  Procedure Date  . Lipoma excision     abd wall  . Tonsillectomy   . Total laryngectomy    History   Social History  . Marital Status: Married    Spouse Name: N/A    Number of Children: 1  . Years of Education: N/A   Occupational History  . Retired    Social History Main Topics  . Smoking status: Former Smoker    Quit date: 01/07/1987  . Smokeless tobacco: Never Used  . Alcohol Use: No  . Drug Use: No  . Sexually Active: None   Other Topics Concern  . None   Social History Narrative   Daily caffeine    Family History  Problem Relation Age of Onset  . Hypertension Father   . Diabetes Brother   . Diabetes Brother   . Heart attack Father   . Colon cancer Neg Hx    No Known Allergies Prior to Admission medications   Medication Sig Start Date End Date Taking? Authorizing Provider  aspirin 81 MG tablet Take 81 mg by mouth daily.   Yes Historical Provider, MD  lisinopril (PRINIVIL,ZESTRIL) 40 MG tablet 1/2 by mouth once daily   Yes Historical Provider, MD  loratadine (CLARITIN) 10 MG tablet Take 10 mg by mouth daily.   Yes Historical Provider, MD  Multiple Vitamin (MULTIVITAMIN)  tablet Take 1 tablet by mouth daily.   Yes Historical Provider, MD  pantoprazole (PROTONIX) 40 MG tablet Take 1 tablet (40 mg total) by mouth 2 (two) times daily. 12/24/11 12/23/12 Yes Mardella Layman, MD  ranitidine (ZANTAC) 150 MG capsule Take 150 mg by mouth as directed.   Yes Historical Provider, MD  Saw Palmetto 1000 MG CAPS 5 by mouth daily   Yes Historical Provider, MD  vitamin B-12 (CYANOCOBALAMIN) 500 MCG tablet Take 500 mcg by mouth daily.   Yes Historical Provider, MD  zolpidem (AMBIEN) 10 MG tablet Take 10 mg by mouth at bedtime as needed.   Yes Historical Provider, MD     Positive ROS: trouble swallowing.   All other systems have been reviewed and were otherwise negative with the exception of those mentioned in the HPI and as above.  Physical Exam: Filed Vitals:   01/10/12 0630  BP: 125/77  Pulse: 66  Temp: 97.4 F (36.3 C)  Resp: 18    General: Alert, no acute distress Oral: Normal oral mucosa and tonsils Nasal: Clear nasal passages Neck: No palpable adenopathy . 2-3 cm subcutaneous nodule posterior R neck Ear: Ear canal is clear with normal appearing TMs Cardiovascular: Regular  rate and rhythm, no murmur.  Respiratory: Clear to auscultation Neurologic: Alert and oriented x 3   Assessment/Plan: esophageal stricture Plan for Procedure(s): ESOPHAGOSCOPY WITH DILITATION EXCISION MASS POSTERIOR NECK   Dillard Cannon, MD 01/10/2012 8:06 AM

## 2012-01-10 NOTE — Op Note (Signed)
NAMEJONPAUL, LUMM              ACCOUNT NO.:  [id number]  MEDICAL RECORD NO.:  [id number]  LOCATION:                                 FACILITY:  PHYSICIAN:  Kristine Garbe. Ezzard Standing, M.D.DATE OF BIRTH:  Oct 08, 1930  DATE OF PROCEDURE:  01/10/2012 DATE OF DISCHARGE:                              OPERATIVE REPORT   PREOPERATIVE DIAGNOSES: 1. Esophageal stricture with dysphagia. 2. A 3-cm posterior neck mass.  POSTOP DIAGNOSIS: 1. Esophageal stricture with dysphagia. 2. A 3-cm posterior neck mass.  OPERATION:  Esophageal dilation with the Savary dilators to 19 mm, excision of posterior neck mass.  (3-4 cm).  SURGEON:  Kristine Garbe. Ezzard Standing, M.D.  ANESTHESIA:  General endotracheal.  COMPLICATIONS:  None.  BRIEF CLINICAL INDICATION:  Aristotle Lieb is a 78 year old gentleman who is 4 years status post total laryngectomy.  He has been having increasing dysphagia with swallow study showing esophageal stricture around the area of previous laryngectomy.  In addition, he has had slowly increasing posterior neck mass, which is a little bit firm to palpation is a subcutaneous tissue measures approximately 3-4 cm size posterior right neck and would like to have this removed in the same time at his dilation.  He was taken operating room for a dilation and removal of a neck mass in the same time, we will exchanged his Provox 2 voice prosthesis for new one Provox 2, 8 mm size.  DESCRIPTION OF PROCEDURE:  After adequate endotracheal anesthesia via his laryngeal stoma, first cervical esophagoscopy was performed.  The upper cervical esophagus was very tight with pronounced stricture and was unable to pass the cervical esophagoscope.  The Savary guidewire was then passed down through the small opening and subsequent dilation of the upper cervical esophagus was performed beginning with the 8 mm dilator, and dilations were performed sequentially up to 19 mm dilation. Prior to performing  dilations, the nasal voice prosthesis was removed. Following the dilations to 19 mm Savary dilator, the cervical esophagoscope was easily passed.  There were some small rents and tears in the mucosa where the previously dilation had been performed but more distal to the prosthesis site and mucosa was normal.  Following dilations, a new Provox 2, 8 mm prosthesis was passed retrograde.  This completed the dilation and exchange of the voice prosthesis.  The patient was then repositioned on the left lateral side, and the area of the neck mass was marked out, prepped with Betadine solution and re- draped.  A horizontal incision was made directly over the posterior neck mass, and dissection was carried down through the subcutaneous tissue. The mass was a little bit firm but was consistent with lipoma.  This was dissected out from the surrounding adipose tissue.  Hemostasis was obtained with cautery.  Specimen sent to pathology.  After obtaining adequate hemostasis, wound was irrigated and closed with 4-0 chromic sutures subcutaneously and 5-0 nylon reapproximate skin edges. Bacitracin ointment and dressing was applied.  Neilan was awoke from anesthesia, and transferred to recovery room, postoperatively doing well.  DISPOSITION:  Valentino was discharged home later this morning on Keflex 500 mg b.i.d. for 5 days, Tylenol and Vicodin p.r.n. pain.  He will follow in  my office in 1 week for recheck to review pathology and have his sutures removed.          ______________________________ Kristine Garbe Ezzard Standing, M.D.     CEN/MEDQ  D:  01/10/2012  T:  01/10/2012  Job:  [id number]

## 2012-01-10 NOTE — Discharge Instructions (Signed)
Keep incision dry for 24 hrs. Diet as tolerate Call office for follow up in 1 week   [phone]  Call your surgeon if you experience:   1.  Fever over 101.0. 2.  Inability to urinate. 3.  Nausea and/or vomiting. 4.  Extreme swelling or bruising at the surgical site. 5.  Continued bleeding from the incision. 6.  Increased pain, redness or drainage from the incision. 7.  Problems related to your pain medication.    Post Anesthesia Home Care Instructions  Activity: Get plenty of rest for the remainder of the day. A responsible adult should stay with you for 24 hours following the procedure.  For the next 24 hours, DO NOT: -Drive a car -Advertising copywriter -Drink alcoholic beverages -Take any medication unless instructed by your physician -Make any legal decisions or sign important papers.  Meals: Start with liquid foods such as gelatin or soup. Progress to regular foods as tolerated. Avoid greasy, spicy, heavy foods. If nausea and/or vomiting occur, drink only clear liquids until the nausea and/or vomiting subsides. Call your physician if vomiting continues.  Special Instructions/Symptoms: Your throat may feel dry or sore from the anesthesia or the breathing tube placed in your throat during surgery. If this causes discomfort, gargle with warm salt water. The discomfort should disappear within 24 hours.

## 2012-01-14 ENCOUNTER — Encounter: Payer: Self-pay | Admitting: Gastroenterology

## 2012-01-14 ENCOUNTER — Ambulatory Visit (INDEPENDENT_AMBULATORY_CARE_PROVIDER_SITE_OTHER): Payer: Medicare Other | Admitting: Gastroenterology

## 2012-01-14 VITALS — BP 150/66 | HR 80 | Ht 69.0 in | Wt 201.4 lb

## 2012-01-14 DIAGNOSIS — Z8521 Personal history of malignant neoplasm of larynx: Secondary | ICD-10-CM | POA: Diagnosis not present

## 2012-01-14 DIAGNOSIS — K219 Gastro-esophageal reflux disease without esophagitis: Secondary | ICD-10-CM | POA: Diagnosis not present

## 2012-01-14 DIAGNOSIS — Z8719 Personal history of other diseases of the digestive system: Secondary | ICD-10-CM

## 2012-01-14 DIAGNOSIS — Z9889 Other specified postprocedural states: Secondary | ICD-10-CM

## 2012-01-14 MED ORDER — LIDOCAINE VISCOUS 2 % MT SOLN: OROMUCOSAL | Status: DC

## 2012-01-14 NOTE — Patient Instructions (Signed)
Take Protonix twice a day.  Take Zantac at bedtime. Take Lidocaine 5 CC before each meal, rx sent to [facility].  Make a follow up for 3 months with Dr Jarold Motto.

## 2012-01-14 NOTE — Progress Notes (Signed)
History of Present Illness: This is a  78 year old African American male who has had laryngeal carcinoma with surgery and resultant radiation therapy. Recent barium swallow was performed because of dysphagia, and showed a cervical esophageal stricture. This was dilated by Dr. Narda Bonds with the rigid endoscope as per his surgical note of 01/10/2012. But he was able to dilate this patient up to a 19 mm size. Mr. Ginsberg has some discomfort in his posterior throat area, but has not noticed any significant dysphagia and currently is on a very soft diet. He has chronic acid reflux much improved on twice a day PPI therapy. Review of his x-ray shows no evidence of distal esophageal obstruction. As mentioned previously, he has a voice box which was recently changed by Dr. Ezzard Standing.    Current Medications, Allergies, Past Medical History, Past Surgical History, Family History and Social History were reviewed in [employer] record.   Assessment and plan: Chronic GERD under good control with twice a day PPI therapy. I have asked him to take his Protonix 30 minutes before breakfast and supper and Zantac at bedtime. I have given him some viscous Xylocaine to use when necessary, and he has scheduled followup with Dr. Ezzard Standing to review his pathology. I see no need to push ahead  with diagnostic endoscopy at this time. Please copy this note to Dr. Clover Mealy. No diagnosis found.

## 2012-01-28 DIAGNOSIS — Z448 Encounter for fitting and adjustment of other external prosthetic devices: Secondary | ICD-10-CM | POA: Diagnosis not present

## 2012-01-28 DIAGNOSIS — Z9089 Acquired absence of other organs: Secondary | ICD-10-CM | POA: Diagnosis not present

## 2012-01-30 DIAGNOSIS — Z9089 Acquired absence of other organs: Secondary | ICD-10-CM | POA: Diagnosis not present

## 2012-01-30 DIAGNOSIS — Z8521 Personal history of malignant neoplasm of larynx: Secondary | ICD-10-CM | POA: Diagnosis not present

## 2012-01-30 DIAGNOSIS — Z462 Encounter for fitting and adjustment of other devices related to nervous system and special senses: Secondary | ICD-10-CM | POA: Diagnosis not present

## 2012-02-06 DIAGNOSIS — T85698A Other mechanical complication of other specified internal prosthetic devices, implants and grafts, initial encounter: Secondary | ICD-10-CM | POA: Diagnosis not present

## 2012-02-06 DIAGNOSIS — Z9089 Acquired absence of other organs: Secondary | ICD-10-CM | POA: Diagnosis not present

## 2012-02-06 DIAGNOSIS — Z8521 Personal history of malignant neoplasm of larynx: Secondary | ICD-10-CM | POA: Diagnosis not present

## 2012-02-06 DIAGNOSIS — Z448 Encounter for fitting and adjustment of other external prosthetic devices: Secondary | ICD-10-CM | POA: Diagnosis not present

## 2012-03-25 DIAGNOSIS — C61 Malignant neoplasm of prostate: Secondary | ICD-10-CM | POA: Diagnosis not present

## 2012-04-01 DIAGNOSIS — C61 Malignant neoplasm of prostate: Secondary | ICD-10-CM | POA: Diagnosis not present

## 2012-04-01 DIAGNOSIS — R972 Elevated prostate specific antigen [PSA]: Secondary | ICD-10-CM | POA: Diagnosis not present

## 2012-04-07 ENCOUNTER — Encounter: Payer: Self-pay | Admitting: *Deleted

## 2012-04-09 ENCOUNTER — Encounter: Payer: Self-pay | Admitting: *Deleted

## 2012-04-10 ENCOUNTER — Encounter: Payer: Self-pay | Admitting: Gastroenterology

## 2012-04-10 ENCOUNTER — Ambulatory Visit (INDEPENDENT_AMBULATORY_CARE_PROVIDER_SITE_OTHER): Payer: Medicare Other | Admitting: Gastroenterology

## 2012-04-10 VITALS — BP 120/64 | HR 76 | Ht 69.0 in | Wt 198.6 lb

## 2012-04-10 DIAGNOSIS — K219 Gastro-esophageal reflux disease without esophagitis: Secondary | ICD-10-CM | POA: Diagnosis not present

## 2012-04-10 DIAGNOSIS — Q394 Esophageal web: Secondary | ICD-10-CM

## 2012-04-10 DIAGNOSIS — Q391 Atresia of esophagus with tracheo-esophageal fistula: Secondary | ICD-10-CM | POA: Diagnosis not present

## 2012-04-10 MED ORDER — PANTOPRAZOLE SODIUM 40 MG PO TBEC: 40.0000 mg | DELAYED_RELEASE_TABLET | Freq: Every day | ORAL | Status: DC

## 2012-04-10 NOTE — Progress Notes (Signed)
History of Present Illness: This is a very nice 78 year-old [place] American male with chronic GERD doing well on Protonix 40 mg twice a day and Zantac at bedtime. His had previous laryngectomy, and had recent ENT surgery for neck lipoma and also dilation of the cervical web by Dr. Narda Bonds. The patient's dysphagia has resolved, and he is 90% improved. Otherwise he denies any gastrointestinal or general medical problems. He does have well-controlled essential hypertension. Blood pressure today is 120/64. His appetite is good and his weight is stable with a BMI of 29.33.    Current Medications, Allergies, Past Medical History, Past Surgical History, Family History and Social History were reviewed in [employer] record.   Assessment and plan: Chronic GERD doing well on PPI therapy. His dysphagia has resolved after rigid endoscopic dilation by Dr. Ezzard Standing. I have decreased his Protonix to 40 mg 30 minutes before the first meal of the day, and he will to continue Zantac at bedtime. We will follow him on a when necessary basis as needed. Please send a copy this note to Dr. Ovidio Kin in ENT... the patient has no lower gastrointestinal symptoms, normal hemoglobin, and no family history of colon cancer. I therefore would not perform screening colonoscopy at his age with his associated medical and ENT problems. Encounter Diagnosis  Name Primary?  . Esophageal reflux Yes

## 2012-04-10 NOTE — Patient Instructions (Addendum)
Decrease your Protonix to once daily.   Refills have been sent to your pharmacy.  Pt states he has plenty of Protonix, rx was not faxed to the pharmacy

## 2012-05-28 DIAGNOSIS — Z9089 Acquired absence of other organs: Secondary | ICD-10-CM | POA: Diagnosis not present

## 2012-05-28 DIAGNOSIS — Z448 Encounter for fitting and adjustment of other external prosthetic devices: Secondary | ICD-10-CM | POA: Diagnosis not present

## 2012-05-28 DIAGNOSIS — Z8521 Personal history of malignant neoplasm of larynx: Secondary | ICD-10-CM | POA: Diagnosis not present

## 2012-05-29 DIAGNOSIS — Z9089 Acquired absence of other organs: Secondary | ICD-10-CM | POA: Diagnosis not present

## 2012-05-29 DIAGNOSIS — Z8521 Personal history of malignant neoplasm of larynx: Secondary | ICD-10-CM | POA: Diagnosis not present

## 2012-05-29 DIAGNOSIS — Z448 Encounter for fitting and adjustment of other external prosthetic devices: Secondary | ICD-10-CM | POA: Diagnosis not present

## 2012-06-11 ENCOUNTER — Encounter: Payer: Self-pay | Admitting: Internal Medicine

## 2012-06-11 ENCOUNTER — Ambulatory Visit (INDEPENDENT_AMBULATORY_CARE_PROVIDER_SITE_OTHER): Payer: Medicare Other | Admitting: Internal Medicine

## 2012-06-11 VITALS — BP 140/80 | HR 83 | Temp 97.1°F | Ht 69.0 in | Wt 203.0 lb

## 2012-06-11 DIAGNOSIS — F411 Generalized anxiety disorder: Secondary | ICD-10-CM | POA: Diagnosis not present

## 2012-06-11 DIAGNOSIS — Z23 Encounter for immunization: Secondary | ICD-10-CM | POA: Diagnosis not present

## 2012-06-11 DIAGNOSIS — I10 Essential (primary) hypertension: Secondary | ICD-10-CM

## 2012-06-11 DIAGNOSIS — E785 Hyperlipidemia, unspecified: Secondary | ICD-10-CM | POA: Diagnosis not present

## 2012-06-11 DIAGNOSIS — K219 Gastro-esophageal reflux disease without esophagitis: Secondary | ICD-10-CM

## 2012-06-11 NOTE — Progress Notes (Signed)
Subjective:    Patient ID: [name], male    DOB: 12/06/1930, 78 y.o.   MRN: [id number]  HPI Here to f/u; overall doing ok,  Pt denies chest pain, increased sob or doe, wheezing, orthopnea, PND, increased LE swelling, palpitations, dizziness or syncope.  Pt denies new neurological symptoms such as new headache, or facial or extremity weakness or numbness   Pt denies polydipsia, polyuria, or low sugar symptoms such as weakness or confusion improved with po intake.  Pt states overall good compliance with meds, trying to follow lower cholesterol diet, wt overall stable but little exercise however.  BP at home usually < 140/90. For flu shot today. No acute complaints. Denies worsening depressive symptoms, suicidal ideation, or panic, though has ongoing anxiety, not increased recently.  Denies worsening reflux, dysphagia, abd pain, n/v, bowel change or blood.  Past Medical History  Diagnosis Date  . Laryngeal cancer      Dr Marvetta Gibbons  . GERD (gastroesophageal reflux disease)   . Adenocarcinoma of prostate, stage 1   . HYPERLIPIDEMIA   . ANXIETY   . HYPERTENSION   . ALLERGIC RHINITIS   . GERD   . DIVERTICULOSIS, COLON 07/06/2003  . BENIGN PROSTATIC HYPERTROPHY   . History of prostatitis   . Impaired glucose tolerance   . UTI (lower urinary tract infection) 2005  . Diverticulosis 07/06/2003  . Third nerve palsy 2005    painful, transient  . Esophageal stricture   . Squamous cell carcinoma    Past Surgical History  Procedure Date  . Lipoma excision     abd wall  . Tonsillectomy   . Total laryngectomy   . Esophageal dilation 01/10/2012    Dr. Ezzard Standing  . Neck surgery 01/10/2012    tumor removed from back of neck  . Mass excision 01/10/2012    Procedure: EXCISION MASS;  Surgeon: Drema Halon, MD;  Location: [facility];  Service: ENT;  Laterality: N/A;  posterior neck mass    reports that he quit smoking about 25 years ago. He has never used smokeless tobacco. He  reports that he does not drink alcohol or use illicit drugs. family history includes Diabetes in his brothers; Heart attack (age of onset:72) in his father; and Hypertension in his father.  There is no history of Colon cancer. No Known Allergies Current Outpatient Prescriptions on File Prior to Visit  Medication Sig Dispense Refill  . aspirin 81 MG tablet Take 81 mg by mouth daily.      Marland Kitchen lisinopril (PRINIVIL,ZESTRIL) 40 MG tablet 1/2 by mouth once daily      . loratadine (CLARITIN) 10 MG tablet Take 10 mg by mouth daily.      . Multiple Vitamin (MULTIVITAMIN) tablet Take 1 tablet by mouth daily.      . pantoprazole (PROTONIX) 40 MG tablet Take 1 tablet (40 mg total) by mouth daily.  30 tablet  11  . ranitidine (ZANTAC) 150 MG capsule Take 150 mg by mouth as directed.      . Saw Palmetto 1000 MG CAPS 5 by mouth daily      . zolpidem (AMBIEN) 10 MG tablet Take 10 mg by mouth at bedtime as needed.      . vitamin B-12 (CYANOCOBALAMIN) 500 MCG tablet Take 500 mcg by mouth daily.       Review of Systems  Constitutional: Negative for diaphoresis and unexpected weight change.  HENT: Negative for tinnitus.   Eyes: Negative for photophobia and visual  disturbance.  Respiratory: Negative for choking and stridor.   Gastrointestinal: Negative for vomiting and blood in stool.  Genitourinary: Negative for hematuria and decreased urine volume.  Musculoskeletal: Negative for gait problem.  Skin: Negative for color change and wound.  Neurological: Negative for tremors and numbness.  Psychiatric/Behavioral: Negative for decreased concentration. The patient is not hyperactive.       Objective:   Physical Exam BP 140/80  Pulse 83  Temp 97.1 F (36.2 C) (Oral)  Ht 5\' 9"  (1.753 m)  Wt 203 lb (92.08 kg)  BMI 29.98 kg/m2  SpO2 96% Physical Exam  VS noted Constitutional: Pt appears well-developed and well-nourished./obese  HENT: Head: Normocephalic. Voice no change, chronic hoarseness Right Ear:  External ear normal.  Left Ear: External ear normal.  Eyes: Conjunctivae and EOM are normal. Pupils are equal, round, and reactive to light.  Neck: Normal range of motion. Neck supple.  Cardiovascular: Normal rate and regular rhythm.   Pulmonary/Chest: Effort normal and breath sounds normal.  Abd:  Soft, NT, non-distended, + BS Neurological: [name] alert. Not confused  Skin: Skin is warm. No erythema.  Psychiatric: Pt behavior is normal. Thought content normal. 1+ nervous    Assessment & Plan:

## 2012-06-11 NOTE — Patient Instructions (Addendum)
You had the flu shot today Continue all other medications as before Please have the pharmacy call with any refills you may need. No need for further lab work or other tests today Please continue to monitor your Blood Pressure on a regular basis; your goal is to be less than 140/90 Please keep your appointments with your specialists as you have planned Please return in 6 months or sooner if needed

## 2012-06-13 ENCOUNTER — Encounter: Payer: Self-pay | Admitting: Internal Medicine

## 2012-06-13 NOTE — Assessment & Plan Note (Signed)
stable overall by hx and exam, most recent data reviewed with pt, and pt to continue medical treatment as before Lab Results  Component Value Date   WBC 6.5 12/10/2011   HGB 14.3 01/10/2012   HCT 42.2 12/10/2011   PLT 265.0 12/10/2011   GLUCOSE 98 01/08/2012   ALT 26 12/10/2011   AST 23 12/10/2011   NA 143 01/08/2012   K 4.4 01/08/2012   CL 106 01/08/2012   CREATININE 1.12 01/08/2012   BUN 13 01/08/2012   CO2 28 01/08/2012   INR 1.1* 12/10/2011    

## 2012-06-13 NOTE — Assessment & Plan Note (Signed)
stable overall by hx and exam, most recent data reviewed with pt, and pt to continue medical treatment as before Lab Results  Component Value Date   WBC 6.5 12/10/2011   HGB 14.3 01/10/2012   HCT 42.2 12/10/2011   PLT 265.0 12/10/2011   GLUCOSE 98 01/08/2012   ALT 26 12/10/2011   AST 23 12/10/2011   NA 143 01/08/2012   K 4.4 01/08/2012   CL 106 01/08/2012   CREATININE 1.12 01/08/2012   BUN 13 01/08/2012   CO2 28 01/08/2012   INR 1.1* 12/10/2011

## 2012-06-13 NOTE — Assessment & Plan Note (Signed)
stable overall by hx and exam, most recent data reviewed with pt, and pt to continue medical treatment as before BP Readings from Last 3 Encounters:  06/11/12 140/80  04/10/12 120/64  01/14/12 150/66

## 2012-06-13 NOTE — Assessment & Plan Note (Signed)
stable overall by hx and exam,  and pt to continue medical treatment as before, for lab today, goal ldl < 100

## 2012-06-30 DIAGNOSIS — R490 Dysphonia: Secondary | ICD-10-CM | POA: Diagnosis not present

## 2012-07-03 DIAGNOSIS — H31019 Macula scars of posterior pole (postinflammatory) (post-traumatic), unspecified eye: Secondary | ICD-10-CM | POA: Diagnosis not present

## 2012-07-03 DIAGNOSIS — H2510 Age-related nuclear cataract, unspecified eye: Secondary | ICD-10-CM | POA: Diagnosis not present

## 2012-09-29 DIAGNOSIS — Z462 Encounter for fitting and adjustment of other devices related to nervous system and special senses: Secondary | ICD-10-CM | POA: Diagnosis not present

## 2012-09-29 DIAGNOSIS — Z859 Personal history of malignant neoplasm, unspecified: Secondary | ICD-10-CM | POA: Diagnosis not present

## 2012-10-28 DIAGNOSIS — C61 Malignant neoplasm of prostate: Secondary | ICD-10-CM | POA: Diagnosis not present

## 2012-11-02 DIAGNOSIS — N403 Nodular prostate with lower urinary tract symptoms: Secondary | ICD-10-CM | POA: Diagnosis not present

## 2012-11-02 DIAGNOSIS — C61 Malignant neoplasm of prostate: Secondary | ICD-10-CM | POA: Diagnosis not present

## 2012-11-02 DIAGNOSIS — R972 Elevated prostate specific antigen [PSA]: Secondary | ICD-10-CM | POA: Diagnosis not present

## 2012-11-02 DIAGNOSIS — N138 Other obstructive and reflux uropathy: Secondary | ICD-10-CM | POA: Diagnosis not present

## 2013-02-15 DIAGNOSIS — Z8521 Personal history of malignant neoplasm of larynx: Secondary | ICD-10-CM | POA: Diagnosis not present

## 2013-02-15 DIAGNOSIS — Z963 Presence of artificial larynx: Secondary | ICD-10-CM | POA: Diagnosis not present

## 2013-02-15 DIAGNOSIS — Z9089 Acquired absence of other organs: Secondary | ICD-10-CM | POA: Diagnosis not present

## 2013-02-15 DIAGNOSIS — Z448 Encounter for fitting and adjustment of other external prosthetic devices: Secondary | ICD-10-CM | POA: Diagnosis not present

## 2013-04-28 DIAGNOSIS — C61 Malignant neoplasm of prostate: Secondary | ICD-10-CM | POA: Diagnosis not present

## 2013-05-04 DIAGNOSIS — R490 Dysphonia: Secondary | ICD-10-CM | POA: Diagnosis not present

## 2013-05-05 DIAGNOSIS — C61 Malignant neoplasm of prostate: Secondary | ICD-10-CM | POA: Diagnosis not present

## 2013-05-05 DIAGNOSIS — R972 Elevated prostate specific antigen [PSA]: Secondary | ICD-10-CM | POA: Diagnosis not present

## 2013-07-07 DIAGNOSIS — Z9889 Other specified postprocedural states: Secondary | ICD-10-CM | POA: Diagnosis not present

## 2013-07-07 DIAGNOSIS — Z8521 Personal history of malignant neoplasm of larynx: Secondary | ICD-10-CM | POA: Diagnosis not present

## 2013-07-07 DIAGNOSIS — IMO0001 Reserved for inherently not codable concepts without codable children: Secondary | ICD-10-CM | POA: Diagnosis not present

## 2013-07-22 ENCOUNTER — Other Ambulatory Visit: Payer: Self-pay

## 2013-08-19 DIAGNOSIS — R490 Dysphonia: Secondary | ICD-10-CM | POA: Diagnosis not present

## 2013-09-16 DIAGNOSIS — Z8546 Personal history of malignant neoplasm of prostate: Secondary | ICD-10-CM

## 2013-09-16 DIAGNOSIS — Z923 Personal history of irradiation: Secondary | ICD-10-CM

## 2013-09-16 HISTORY — DX: Personal history of malignant neoplasm of prostate: Z85.46

## 2013-09-16 HISTORY — DX: Personal history of irradiation: Z92.3

## 2013-10-07 ENCOUNTER — Encounter (HOSPITAL_BASED_OUTPATIENT_CLINIC_OR_DEPARTMENT_OTHER): Payer: Self-pay | Admitting: *Deleted

## 2013-10-07 NOTE — Progress Notes (Signed)
Pt was here 2013 for neck mass-has trach stoma- Drives-good esoph speech. To come in for ekg bmet

## 2013-10-08 ENCOUNTER — Encounter (HOSPITAL_BASED_OUTPATIENT_CLINIC_OR_DEPARTMENT_OTHER)
Admission: RE | Admit: 2013-10-08 | Discharge: 2013-10-08 | Disposition: A | Discharge status: Home / Self Care | Payer: Medicare Other | Source: Ambulatory Visit | Attending: Otolaryngology | Admitting: Otolaryngology

## 2013-10-08 ENCOUNTER — Other Ambulatory Visit: Payer: Self-pay

## 2013-10-08 DIAGNOSIS — Z01818 Encounter for other preprocedural examination: Secondary | ICD-10-CM | POA: Diagnosis not present

## 2013-10-08 DIAGNOSIS — Z01810 Encounter for preprocedural cardiovascular examination: Secondary | ICD-10-CM | POA: Diagnosis not present

## 2013-10-08 DIAGNOSIS — Z01812 Encounter for preprocedural laboratory examination: Secondary | ICD-10-CM | POA: Diagnosis not present

## 2013-10-08 LAB — BASIC METABOLIC PANEL
BUN: 15 mg/dL (ref 6–23)
CHLORIDE: 105 meq/L (ref 96–112)
CO2: 29 mEq/L (ref 19–32)
Calcium: 9.2 mg/dL (ref 8.4–10.5)
Creatinine, Ser: 1 mg/dL (ref 0.50–1.35)
GFR calc Af Amer: 80 mL/min — ABNORMAL LOW (ref >90)
GFR, EST NON AFRICAN AMERICAN: 69 mL/min — AB (ref >90)
GLUCOSE: 103 mg/dL — AB (ref 70–99)
Potassium: 5.1 mEq/L (ref 3.7–5.3)
Sodium: 145 mEq/L (ref 137–147)

## 2013-10-11 NOTE — H&P (Signed)
PREOPERATIVE H&P  Chief Complaint: difficulty with voice prosthesis  HPI: [name] is a 80 y.o. male who presents for evaluation of difficulty with his voice prosthesis. He is 5 years status post laryngectomy and placement of TE voice prosthesis. He's had trouble recently with granulation tissue and the angle and thickness of the prosthesis. He use to use an 8 and now require a 10. He's taken to the OR for revision of the prosthesis and esophageal dilation.   Past Medical History  Diagnosis Date  . Laryngeal cancer      Dr Gwenlyn Saran  . GERD (gastroesophageal reflux disease)   . Adenocarcinoma of prostate, stage 1   . HYPERLIPIDEMIA   . ANXIETY   . HYPERTENSION   . ALLERGIC RHINITIS   . GERD   . DIVERTICULOSIS, COLON 07/06/2003  . BENIGN PROSTATIC HYPERTROPHY   . History of prostatitis   . Impaired glucose tolerance   . UTI (lower urinary tract infection) 2005  . Diverticulosis 07/06/2003  . Third nerve palsy 2005    painful, transient  . Esophageal stricture   . Squamous cell carcinoma   . Full dentures   . Wears glasses    Past Surgical History  Procedure Laterality Date  . Lipoma excision      abd wall  . Tonsillectomy    . Total laryngectomy    . Esophageal dilation  01/10/2012    Dr. Lucia Gaskins  . Neck surgery  01/10/2012    tumor removed from back of neck  . Mass excision  01/10/2012    Procedure: EXCISION MASS;  Surgeon: Rozetta Nunnery, MD;  Location: [facility];  Service: ENT;  Laterality: N/A;  posterior neck mass   History   Social History  . Marital Status: Married    Spouse Name: N/A    Number of Children: 1  . Years of Education: N/A   Occupational History  . Retired    Social History Main Topics  . Smoking status: Former Smoker    Quit date: 01/07/1987  . Smokeless tobacco: Never Used  . Alcohol Use: No  . Drug Use: No  . Sexual Activity: None   Other Topics Concern  . None   Social History Narrative   Daily caffeine     Family History  Problem Relation Age of Onset  . Hypertension Father   . Diabetes Brother   . Diabetes Brother   . Heart attack Father 13  . Colon cancer Neg Hx    No Known Allergies Prior to Admission medications   Medication Sig Start Date End Date Taking? Authorizing Provider  guaiFENesin (MUCINEX) 600 MG 12 hr tablet Take by mouth 2 (two) times daily.   Yes Historical Provider, MD  aspirin 81 MG tablet Take 81 mg by mouth daily.    Historical Provider, MD  lisinopril (PRINIVIL,ZESTRIL) 40 MG tablet 1/2 by mouth once daily    Historical Provider, MD  loratadine (CLARITIN) 10 MG tablet Take 10 mg by mouth daily.    Historical Provider, MD  Multiple Vitamin (MULTIVITAMIN) tablet Take 1 tablet by mouth daily.    Historical Provider, MD  pantoprazole (PROTONIX) 40 MG tablet Take 1 tablet (40 mg total) by mouth daily. 04/10/12 04/10/13  Sable Feil, MD  ranitidine (ZANTAC) 150 MG capsule Take 150 mg by mouth as directed.    Historical Provider, MD  Saw Palmetto 1000 MG CAPS 5 by mouth daily    Historical Provider, MD  vitamin B-12 (CYANOCOBALAMIN)  500 MCG tablet Take 500 mcg by mouth daily.    Historical Provider, MD  zolpidem (AMBIEN) 10 MG tablet Take 10 mg by mouth at bedtime as needed.    Historical Provider, MD     Positive ROS: negative  All other systems have been reviewed and were otherwise negative with the exception of those mentioned in the HPI and as above.  Physical Exam: There were no vitals filed for this visit.  General: Alert, no acute distress Oral: Normal oral mucosa and tonsils Nasal: Clear nasal passages Neck: No palpable adenopathy or thyroid nodules. Tracheal stoma with excessive granulation tissue Ear: Ear canal is clear with normal appearing TMs Cardiovascular: Regular rate and rhythm, no murmur.  Respiratory: Clear to auscultation Neurologic: Alert and oriented x 3   Assessment/Plan: TRACHEAL STENOSIS  Plan for Procedure(s): TRACHEOSTOMA  REVSION    Melony Overly, MD 10/11/2013 5:07 PM

## 2013-10-12 ENCOUNTER — Encounter (HOSPITAL_BASED_OUTPATIENT_CLINIC_OR_DEPARTMENT_OTHER): Payer: Self-pay

## 2013-10-12 ENCOUNTER — Encounter (HOSPITAL_BASED_OUTPATIENT_CLINIC_OR_DEPARTMENT_OTHER)
Admission: RE | Disposition: A | Discharge status: Home / Self Care | Payer: Self-pay | Source: Ambulatory Visit | Attending: Otolaryngology

## 2013-10-12 ENCOUNTER — Ambulatory Visit (HOSPITAL_BASED_OUTPATIENT_CLINIC_OR_DEPARTMENT_OTHER)
Admission: RE | Admit: 2013-10-12 | Discharge: 2013-10-12 | Disposition: A | Discharge status: Home / Self Care | Payer: Medicare Other | Source: Ambulatory Visit | Attending: Otolaryngology | Admitting: Otolaryngology

## 2013-10-12 ENCOUNTER — Encounter (HOSPITAL_BASED_OUTPATIENT_CLINIC_OR_DEPARTMENT_OTHER): Payer: Medicare Other | Admitting: Anesthesiology

## 2013-10-12 ENCOUNTER — Ambulatory Visit (HOSPITAL_BASED_OUTPATIENT_CLINIC_OR_DEPARTMENT_OTHER): Payer: Medicare Other | Admitting: Anesthesiology

## 2013-10-12 DIAGNOSIS — F411 Generalized anxiety disorder: Secondary | ICD-10-CM | POA: Insufficient documentation

## 2013-10-12 DIAGNOSIS — K219 Gastro-esophageal reflux disease without esophagitis: Secondary | ICD-10-CM | POA: Diagnosis not present

## 2013-10-12 DIAGNOSIS — K222 Esophageal obstruction: Secondary | ICD-10-CM | POA: Diagnosis not present

## 2013-10-12 DIAGNOSIS — Y833 Surgical operation with formation of external stoma as the cause of abnormal reaction of the patient, or of later complication, without mention of misadventure at the time of the procedure: Secondary | ICD-10-CM | POA: Insufficient documentation

## 2013-10-12 DIAGNOSIS — J9503 Malfunction of tracheostomy stoma: Secondary | ICD-10-CM | POA: Diagnosis not present

## 2013-10-12 DIAGNOSIS — C329 Malignant neoplasm of larynx, unspecified: Secondary | ICD-10-CM | POA: Diagnosis not present

## 2013-10-12 DIAGNOSIS — E785 Hyperlipidemia, unspecified: Secondary | ICD-10-CM | POA: Insufficient documentation

## 2013-10-12 DIAGNOSIS — Z87891 Personal history of nicotine dependence: Secondary | ICD-10-CM | POA: Diagnosis not present

## 2013-10-12 DIAGNOSIS — C61 Malignant neoplasm of prostate: Secondary | ICD-10-CM | POA: Diagnosis not present

## 2013-10-12 DIAGNOSIS — J988 Other specified respiratory disorders: Secondary | ICD-10-CM | POA: Diagnosis not present

## 2013-10-12 DIAGNOSIS — I10 Essential (primary) hypertension: Secondary | ICD-10-CM | POA: Insufficient documentation

## 2013-10-12 DIAGNOSIS — Z9089 Acquired absence of other organs: Secondary | ICD-10-CM | POA: Diagnosis not present

## 2013-10-12 DIAGNOSIS — J398 Other specified diseases of upper respiratory tract: Secondary | ICD-10-CM | POA: Diagnosis not present

## 2013-10-12 HISTORY — PX: ESOPHAGEAL DILATION: SHX303

## 2013-10-12 HISTORY — DX: Presence of dental prosthetic device (complete) (partial): Z97.2

## 2013-10-12 HISTORY — DX: Complete loss of teeth, unspecified cause, unspecified class: K08.109

## 2013-10-12 HISTORY — PX: TRACHEOSTOMY REVISION: SHX6133

## 2013-10-12 LAB — POCT HEMOGLOBIN-HEMACUE: Hemoglobin: 15.7 g/dL (ref 13.0–17.0)

## 2013-10-12 SURGERY — REVISION, STOMA, TRACHEA
Anesthesia: General

## 2013-10-12 MED ORDER — MIDAZOLAM HCL 2 MG/2ML IJ SOLN: 1.0000 mg | INTRAMUSCULAR | Status: DC | PRN

## 2013-10-12 MED ORDER — OXYCODONE HCL 5 MG/5ML PO SOLN
5.0000 mg | Freq: Once | ORAL | Status: AC | PRN
  Administered 2013-10-12: 5 mg via ORAL
  Filled 2013-10-12: qty 5

## 2013-10-12 MED ORDER — PROPOFOL INFUSION 10 MG/ML OPTIME
INTRAVENOUS | Status: DC | PRN
  Administered 2013-10-12: 50 ug/kg/min via INTRAVENOUS

## 2013-10-12 MED ORDER — LACTATED RINGERS IV SOLN
INTRAVENOUS | Status: DC
  Administered 2013-10-12 (×2): via INTRAVENOUS

## 2013-10-12 MED ORDER — FENTANYL CITRATE 0.05 MG/ML IJ SOLN
INTRAMUSCULAR | Status: DC | PRN
  Administered 2013-10-12 (×2): 50 ug via INTRAVENOUS

## 2013-10-12 MED ORDER — CEPHALEXIN 500 MG PO CAPS: 500.0000 mg | ORAL_CAPSULE | Freq: Two times a day (BID) | ORAL | Status: AC

## 2013-10-12 MED ORDER — ONDANSETRON HCL 4 MG/2ML IJ SOLN: 4.0000 mg | Freq: Once | INTRAMUSCULAR | Status: DC | PRN

## 2013-10-12 MED ORDER — TRIAMCINOLONE ACETONIDE 40 MG/ML IJ SUSP
INTRAMUSCULAR | Status: DC | PRN
  Administered 2013-10-12: 40 mg

## 2013-10-12 MED ORDER — FENTANYL CITRATE 0.05 MG/ML IJ SOLN
INTRAMUSCULAR | Status: AC
  Filled 2013-10-12: qty 2

## 2013-10-12 MED ORDER — CEFAZOLIN SODIUM-DEXTROSE 2-3 GM-% IV SOLR
INTRAVENOUS | Status: AC
Start: 2013-10-12 — End: 2013-10-12
  Filled 2013-10-12: qty 50

## 2013-10-12 MED ORDER — FENTANYL CITRATE 0.05 MG/ML IJ SOLN
25.0000 ug | INTRAMUSCULAR | Status: DC | PRN
  Administered 2013-10-12: 25 ug via INTRAVENOUS

## 2013-10-12 MED ORDER — EPINEPHRINE HCL 1 MG/ML IJ SOLN
INTRAMUSCULAR | Status: AC
  Filled 2013-10-12: qty 1

## 2013-10-12 MED ORDER — MUPIROCIN 2 % EX OINT: 1.0000 "application " | TOPICAL_OINTMENT | Freq: Two times a day (BID) | CUTANEOUS | Status: DC

## 2013-10-12 MED ORDER — OXYCODONE HCL 5 MG PO TABS: 5.0000 mg | ORAL_TABLET | Freq: Once | ORAL | Status: AC | PRN

## 2013-10-12 MED ORDER — PROPOFOL 10 MG/ML IV BOLUS
INTRAVENOUS | Status: DC | PRN
Start: 2013-10-12 — End: 2013-10-12
  Administered 2013-10-12: 40 mg via INTRAVENOUS
  Administered 2013-10-12: 50 mg via INTRAVENOUS

## 2013-10-12 MED ORDER — TRIAMCINOLONE ACETONIDE 40 MG/ML IJ SUSP
INTRAMUSCULAR | Status: AC
  Filled 2013-10-12: qty 5

## 2013-10-12 MED ORDER — FENTANYL CITRATE 0.05 MG/ML IJ SOLN
INTRAMUSCULAR | Status: AC
  Filled 2013-10-12: qty 4

## 2013-10-12 MED ORDER — CEFAZOLIN SODIUM-DEXTROSE 2-3 GM-% IV SOLR
INTRAVENOUS | Status: DC | PRN
  Administered 2013-10-12: 2 g via INTRAVENOUS

## 2013-10-12 MED ORDER — FENTANYL CITRATE 0.05 MG/ML IJ SOLN: 50.0000 ug | INTRAMUSCULAR | Status: DC | PRN

## 2013-10-12 MED ORDER — EPHEDRINE SULFATE 50 MG/ML IJ SOLN
INTRAMUSCULAR | Status: DC | PRN
  Administered 2013-10-12: 10 mg via INTRAVENOUS

## 2013-10-12 MED ORDER — DEXAMETHASONE SODIUM PHOSPHATE 4 MG/ML IJ SOLN
INTRAMUSCULAR | Status: DC | PRN
  Administered 2013-10-12: 5 mg via INTRAVENOUS

## 2013-10-12 SURGICAL SUPPLY — 62 items
APL SKNCLS STERI-STRIP NONHPOA (GAUZE/BANDAGES/DRESSINGS)
APPLICATOR COTTON TIP 6IN STRL (MISCELLANEOUS) ×4 IMPLANT
BENZOIN TINCTURE PRP APPL 2/3 (GAUZE/BANDAGES/DRESSINGS) IMPLANT
BLADE SURG 15 STRL LF DISP TIS (BLADE) ×1 IMPLANT
BLADE SURG 15 STRL SS (BLADE) ×3
CANISTER SUCT 1200ML W/VALVE (MISCELLANEOUS) ×3 IMPLANT
CLEANER CAUTERY TIP 5X5 PAD (MISCELLANEOUS) IMPLANT
CLOSURE WOUND 1/2 X4 (GAUZE/BANDAGES/DRESSINGS)
COVER MAYO STAND STRL (DRAPES) ×3 IMPLANT
COVER TABLE BACK 60X90 (DRAPES) ×1 IMPLANT
DRAPE U-SHAPE 76X120 STRL (DRAPES) ×1 IMPLANT
ELECT COATED BLADE 2.86 ST (ELECTRODE) IMPLANT
ELECT REM PT RETURN 9FT ADLT (ELECTROSURGICAL) ×3
ELECTRODE REM PT RTRN 9FT ADLT (ELECTROSURGICAL) ×1 IMPLANT
FILTER 7/8 IN (FILTER) ×2 IMPLANT
GAUZE SPONGE 4X4 16PLY XRAY LF (GAUZE/BANDAGES/DRESSINGS) IMPLANT
GLOVE BIOGEL M 7.0 STRL (GLOVE) ×2 IMPLANT
GLOVE BIOGEL PI IND STRL 7.0 (GLOVE) IMPLANT
GLOVE BIOGEL PI IND STRL 7.5 (GLOVE) IMPLANT
GLOVE BIOGEL PI INDICATOR 7.0 (GLOVE) ×2
GLOVE BIOGEL PI INDICATOR 7.5 (GLOVE) ×2
GLOVE ECLIPSE 6.5 STRL STRAW (GLOVE) ×2 IMPLANT
GLOVE SS BIOGEL STRL SZ 7.5 (GLOVE) ×1 IMPLANT
GLOVE SUPERSENSE BIOGEL SZ 7.5 (GLOVE) ×2
GOWN STRL REUS W/ TWL LRG LVL3 (GOWN DISPOSABLE) ×1 IMPLANT
GOWN STRL REUS W/ TWL XL LVL3 (GOWN DISPOSABLE) ×1 IMPLANT
GOWN STRL REUS W/TWL LRG LVL3 (GOWN DISPOSABLE) ×3
GOWN STRL REUS W/TWL XL LVL3 (GOWN DISPOSABLE)
NDL SAFETY ECLIPSE 18X1.5 (NEEDLE) IMPLANT
NEEDLE 27GAX1X1/2 (NEEDLE) ×3 IMPLANT
NEEDLE HYPO 18GX1.5 SHARP (NEEDLE)
NS IRRIG 1000ML POUR BTL (IV SOLUTION) IMPLANT
PACK BASIN DAY SURGERY FS (CUSTOM PROCEDURE TRAY) ×1 IMPLANT
PAD CLEANER CAUTERY TIP 5X5 (MISCELLANEOUS)
PENCIL BUTTON HOLSTER BLD 10FT (ELECTRODE) ×2 IMPLANT
REDUCTION FITTING 1/4 IN (FILTER) ×2 IMPLANT
SHEET MEDIUM DRAPE 40X70 STRL (DRAPES) ×2 IMPLANT
SLEEVE SCD COMPRESS KNEE MED (MISCELLANEOUS) IMPLANT
SPONGE GAUZE 2X2 8PLY STER LF (GAUZE/BANDAGES/DRESSINGS)
SPONGE GAUZE 2X2 8PLY STRL LF (GAUZE/BANDAGES/DRESSINGS) IMPLANT
SPONGE GAUZE 4X4 12PLY STER LF (GAUZE/BANDAGES/DRESSINGS) ×2 IMPLANT
STRIP CLOSURE SKIN 1/2X4 (GAUZE/BANDAGES/DRESSINGS) IMPLANT
SUCTION FRAZIER TIP 10 FR DISP (SUCTIONS) ×2 IMPLANT
SURGILUBE 2OZ TUBE FLIPTOP (MISCELLANEOUS) ×2 IMPLANT
SUT CHROMIC 3 0 PS 2 (SUTURE) IMPLANT
SUT CHROMIC 4 0 P 3 18 (SUTURE) IMPLANT
SUT ETHILON 5 0 P 3 18 (SUTURE)
SUT NYLON ETHILON 5-0 P-3 1X18 (SUTURE) IMPLANT
SUT SILK 0 SH 30 (SUTURE) IMPLANT
SUT SILK 0 TIES 10X30 (SUTURE) ×2 IMPLANT
SUT SILK 2 0 FS (SUTURE) IMPLANT
SUT SILK 3 0 SH 30 (SUTURE) IMPLANT
SUT SILK 4 0 TIES 17X18 (SUTURE) IMPLANT
SYR 3ML 18GX1 1/2 (SYRINGE) ×2 IMPLANT
SYR BULB 3OZ (MISCELLANEOUS) IMPLANT
SYR CONTROL 10ML LL (SYRINGE) ×3 IMPLANT
TAPE HYPAFIX 4 X10 (GAUZE/BANDAGES/DRESSINGS) IMPLANT
TOWEL OR 17X24 6PK STRL BLUE (TOWEL DISPOSABLE) ×4 IMPLANT
TRAY DSU PREP LF (CUSTOM PROCEDURE TRAY) IMPLANT
TUBE CONNECTING 20'X1/4 (TUBING) ×2
TUBE CONNECTING 20X1/4 (TUBING) ×3 IMPLANT
WATER STERILE IRR 1000ML POUR (IV SOLUTION) ×2 IMPLANT

## 2013-10-12 NOTE — Discharge Instructions (Addendum)
Take Keflex 500 mg twice per day for 1 week Tylenol or motrin prn pain.  Throat lozenges for sore throat.  Diet as tolerated Apply antibiotic ointment to tracheal stenosis daily  (neosporin or bacitracin or similar) Call office for follow up appt in 1 week.     Post Anesthesia Home Care Instructions  Activity: Get plenty of rest for the remainder of the day. A responsible adult should stay with you for 24 hours following the procedure.  For the next 24 hours, DO NOT: -Drive a car -Paediatric nurse -Drink alcoholic beverages -Take any medication unless instructed by your physician -Make any legal decisions or sign important papers.  Meals: Start with liquid foods such as gelatin or soup. Progress to regular foods as tolerated. Avoid greasy, spicy, heavy foods. If nausea and/or vomiting occur, drink only clear liquids until the nausea and/or vomiting subsides. Call your physician if vomiting continues.  Special Instructions/Symptoms: Your throat may feel dry or sore from the anesthesia or the breathing tube placed in your throat during surgery. If this causes discomfort, gargle with warm salt water. The discomfort should disappear within 24 hours.

## 2013-10-12 NOTE — Anesthesia Procedure Notes (Signed)
Procedure Name: Intubation Date/Time: 10/12/2013 9:44 AM Performed by: Maryella Shivers Pre-anesthesia Checklist: Patient identified, Emergency Drugs available, Suction available, Patient being monitored and Timeout performed Patient Re-evaluated:Patient Re-evaluated prior to inductionOxygen Delivery Method: Circle system utilized Preoxygenation: Pre-oxygenation with 100% oxygen Intubation Type: IV induction Tube size: 6.0 mm Number of attempts: 1 Airway Equipment and Method: Tracheostomy Placement Confirmation: positive ETCO2 and breath sounds checked- equal and bilateral Comments: ETT inserted through insitu tracheostomy

## 2013-10-12 NOTE — Interval H&P Note (Signed)
History and Physical Interval Note:  10/12/2013 9:26 AM  [name]  has presented today for surgery, with the diagnosis of TRACHEAL STENOSIS   The various methods of treatment have been discussed with the patient and family. After consideration of risks, benefits and other options for treatment, the patient has consented to  Procedure(s): TRACHEOSTOMA REVSION  (N/A) as a surgical intervention .  The patient's history has been reviewed, patient examined, no change in status, stable for surgery.  I have reviewed the patient's chart and labs.  Questions were answered to the patient's satisfaction.     [name]

## 2013-10-12 NOTE — Transfer of Care (Signed)
Immediate Anesthesia Transfer of Care Note  Patient: [name]  Procedure(s) Performed: Procedure(s): TRACHEOSTOMA  REVSION w/ CO2 LASER (N/A) ESOPHAGEAL DILATION (N/A)  Patient Location: PACU  Anesthesia Type:General  Level of Consciousness: awake, alert  and oriented  Airway & Oxygen Therapy: Patient Spontanous Breathing and Patient connected to face mask oxygen  Post-op Assessment: Report given to PACU RN and Post -op Vital signs reviewed and stable  Post vital signs: Reviewed and stable  Complications: No apparent anesthesia complications

## 2013-10-12 NOTE — Anesthesia Preprocedure Evaluation (Signed)
Anesthesia Evaluation  Patient identified by MRN, date of birth, ID band Patient awake    Reviewed: Allergy & Precautions, H&P , NPO status , Patient's Chart, lab work & pertinent test results  Airway Mallampati: I TM Distance: >3 FB Neck ROM: Full    Dental  (+) Edentulous Upper, Edentulous Lower and Dental Advisory Given   Pulmonary former smoker,  breath sounds clear to auscultation        Cardiovascular hypertension, Pt. on medications Rhythm:Regular     Neuro/Psych    GI/Hepatic GERD-  Medicated and Controlled,  Endo/Other    Renal/GU      Musculoskeletal   Abdominal   Peds  Hematology   Anesthesia Other Findings   Reproductive/Obstetrics                           Anesthesia Physical Anesthesia Plan  ASA: III  Anesthesia Plan: General   Post-op Pain Management:    Induction: Intravenous  Airway Management Planned: Tracheostomy  Additional Equipment:   Intra-op Plan:   Post-operative Plan: Extubation in OR  Informed Consent: I have reviewed the patients History and Physical, chart, labs and discussed the procedure including the risks, benefits and alternatives for the proposed anesthesia with the patient or authorized representative who has indicated his/her understanding and acceptance.   Dental advisory given  Plan Discussed with: CRNA, Anesthesiologist and Surgeon  Anesthesia Plan Comments:         Anesthesia Quick Evaluation

## 2013-10-12 NOTE — Brief Op Note (Signed)
10/12/2013  11:21 AM  PATIENT:  [name]  80 y.o. male  PRE-OPERATIVE DIAGNOSIS:  TRACHEAL STENOSIS , ESOPHAGEAL STENOSIS  POST-OPERATIVE DIAGNOSIS:  TRACHEAL STENOSIS , ESOPHAGEAL STENOSIS  PROCEDURE:  Procedure(s): TRACHEOSTOMA  REVSION w/ CO2 LASER (N/A) ESOPHAGEAL DILATION (N/A)  SURGEON:  Surgeon(s) and Role:    * Rozetta Nunnery, MD - Primary  PHYSICIAN ASSISTANT:   ASSISTANTS: none   ANESTHESIA:   general  EBL:  Total I/O In: 1000 [I.V.:1000] Out: -   BLOOD ADMINISTERED:none  DRAINS: none   LOCAL MEDICATIONS USED:  NONE  SPECIMEN:  No Specimen  DISPOSITION OF SPECIMEN:  N/A  COUNTS:  YES  TOURNIQUET:  * No tourniquets in log *  DICTATION: .Other Dictation: Dictation Number [id number]  PLAN OF CARE: Discharge to home after PACU  PATIENT DISPOSITION:  PACU - hemodynamically stable.   Delay start of Pharmacological VTE agent (>24hrs) due to surgical blood loss or risk of bleeding: yes

## 2013-10-12 NOTE — Anesthesia Postprocedure Evaluation (Signed)
  Anesthesia Post-op Note  Patient: [name]  Procedure(s) Performed: Procedure(s): TRACHEOSTOMA  REVSION w/ CO2 LASER (N/A) ESOPHAGEAL DILATION (N/A)  Patient Location: PACU  Anesthesia Type:General  Level of Consciousness: awake, alert  and oriented  Airway and Oxygen Therapy: Patient Spontanous Breathing and Patient connected to tracheostomy mask oxygen  Post-op Pain: none  Post-op Assessment: Post-op Vital signs reviewed  Post-op Vital Signs: Reviewed  Complications: No apparent anesthesia complications

## 2013-10-13 NOTE — Op Note (Signed)
NAMEDEBRA, [name]              ACCOUNT NO.:  [id number]  MEDICAL RECORD NO.:  [id number]  LOCATION:                               FACILITY:  [facility]  PHYSICIAN:  Leonides Sake. Lucia Gaskins, M.D.DATE OF BIRTH:  1931-02-15  DATE OF PROCEDURE:  10/12/2013 DATE OF DISCHARGE:  10/12/2013                              OPERATIVE REPORT   PREOPERATIVE DIAGNOSES: 1. Tracheal stoma stenosis with excessive granulation tissue. 2. Esophageal stenosis.  POSTOPERATIVE DIAGNOSES: 1. Tracheal stoma stenosis with excessive granulation tissue. 2. Esophageal stenosis.  OPERATION PERFORMED:  Modification of tracheal stoma stenosis with dilation of esophageal stenosis using Savary dilators 12 to 18.  SURGEON:  Leonides Sake. Lucia Gaskins, MD  ANESTHESIA:  General endotracheal.  COMPLICATIONS:  None.  BRIEF CLINICAL NOTE:  [name] is an 80 year old gentleman who is about 5 years status post laryngectomy with a placement of tracheoesophageal prosthesis for speaking.  He has done well, but had a little more trouble with the prosthesis recently as granulation tissues tended to grow over the prosthesis, and he had changed his prosthesis from an 8-mm length to 10-mm length.  Because of difficulty with his prosthesis, he was taken to the operating room this time for removal of excessive granulation tissue and dilation of the upper esophagus.  DESCRIPTION OF PROCEDURE:  After adequate endotracheal anesthesia through his tracheal stoma, first esophagoscopy was performed, but the airway tight.  Esophagus dilation using the Savary dilators was initiated using the 12 mm, initially which passed relatively firmly and then this was advanced up to the #18 mm Savary dilator.  At this point, using CO2 laser, there was excessive amount of granulation tissue around the superior aspect of the Provox II prosthesis for speech and the CO2 laser 4 amps was used to laser away some of this extensive tissue. Next, the  cervical esophagoscopy was performed after dilating the cervical esophagus to 18 mm.  I was able to pass the cervical esophagus scope and could visualize the prosthesis internally.  There really was not any significant granulation tissue internally.  The prosthesis was removed and further excision of granulation tissue around the T fistula was performed using the CO2 laser at 4.  The previous Provox prosthesis was a 10 mm.  A right angle hemostat was then used to measure the length from the tracheal opening into the esophageal opening.  This estimated to be between 6 and 8 mm.  A new Provox II prosthesis was placed at this time using the 8-mm prosthesis, this was placed easily.  On esophagoscopy, the inside flange sat well, was flat with no overlying tissue.  Externally, the prosthesis sat well within the tracheal stoma. There was a small amount of additional tissue that was lasered using the CO2 laser, but this was fairly minimal.  This completed the excision of the excessive granulation tissue and scar tissue.  The area around the prosthesis was then injected with about 0.6 to 0.8 mL of Kenalog 40 mg/mL. Bacitracin ointment was applied.  The patient was subsequently awoken from anesthesia and transferred to the recovery room, postop doing well. He was discharged home on Keflex 500 mg b.i.d. for 1 week, bacitracin ointment applied to stoma  daily.  We will have him follow up in my office in 1 week for recheck.          ______________________________ Leonides Sake. Lucia Gaskins, M.D.     CEN/MEDQ  D:  10/12/2013  T:  10/13/2013  Job:  [id number]

## 2013-10-14 ENCOUNTER — Encounter (HOSPITAL_BASED_OUTPATIENT_CLINIC_OR_DEPARTMENT_OTHER): Payer: Self-pay | Admitting: Otolaryngology

## 2013-11-03 DIAGNOSIS — C61 Malignant neoplasm of prostate: Secondary | ICD-10-CM | POA: Diagnosis not present

## 2013-11-10 DIAGNOSIS — R972 Elevated prostate specific antigen [PSA]: Secondary | ICD-10-CM | POA: Diagnosis not present

## 2013-11-10 DIAGNOSIS — C61 Malignant neoplasm of prostate: Secondary | ICD-10-CM | POA: Diagnosis not present

## 2013-11-15 ENCOUNTER — Other Ambulatory Visit: Payer: Self-pay | Admitting: Urology

## 2013-11-15 DIAGNOSIS — C61 Malignant neoplasm of prostate: Secondary | ICD-10-CM

## 2013-11-22 ENCOUNTER — Encounter (HOSPITAL_COMMUNITY)
Admission: RE | Admit: 2013-11-22 | Discharge: 2013-11-22 | Disposition: A | Discharge status: Home / Self Care | Payer: Medicare Other | Source: Ambulatory Visit | Attending: Urology | Admitting: Urology

## 2013-11-22 ENCOUNTER — Ambulatory Visit (HOSPITAL_COMMUNITY)
Admission: RE | Admit: 2013-11-22 | Discharge: 2013-11-22 | Disposition: A | Discharge status: Home / Self Care | Payer: Medicare Other | Source: Ambulatory Visit | Attending: Urology | Admitting: Urology

## 2013-11-22 DIAGNOSIS — C61 Malignant neoplasm of prostate: Secondary | ICD-10-CM | POA: Diagnosis not present

## 2013-11-22 MED ORDER — TECHNETIUM TC 99M MEDRONATE IV KIT
26.1000 | PACK | Freq: Once | INTRAVENOUS | Status: AC | PRN
  Administered 2013-11-22: 26.1 via INTRAVENOUS

## 2013-12-10 ENCOUNTER — Encounter: Payer: Self-pay | Admitting: Gastroenterology

## 2013-12-10 ENCOUNTER — Encounter: Payer: Self-pay | Admitting: Radiation Oncology

## 2013-12-10 NOTE — Progress Notes (Signed)
GU Location of Tumor / Histology: prostate cancer  If Prostate Cancer, Gleason Score is (3+4=7 and 3+3=6) and PSA is (19.03 on 11/03/13)  Patient presented with signs/symptoms of: rising PSA  Biopsies of prostate revealed:     Past/Anticipated interventions by urology, if any: prostate biopsy 2006  Past/Anticipated interventions by medical oncology, if any: none  Weight changes, if any: gained 5 lbs in last 2 months.   Bowel/Bladder complaints, if any: Feels like his bladder does not empty completely, has a weak urinary stream.  Gets up 3 times a night to urinate.  Denies hematuria and dysuria.  Nausea/Vomiting, if any: no  Pain issues, if any:  no  SAFETY ISSUES:  [facility]? [facility] [place] [facility] - 6 years ago  Pacemaker/ICD? no  Possible current pregnancy? no  Is the patient on methotrexate? no  Current Complaints / other details:  Patient has a history of laryngeal cancer with TOTAL LARYNGECTOMY.  Has a tracheostomy.  Here with his wife.  IPSS score of 14.

## 2013-12-13 ENCOUNTER — Ambulatory Visit
Admission: RE | Admit: 2013-12-13 | Discharge: 2013-12-13 | Disposition: A | Discharge status: Home / Self Care | Payer: Medicare Other | Source: Ambulatory Visit | Attending: Radiation Oncology | Admitting: Radiation Oncology

## 2013-12-13 ENCOUNTER — Encounter: Payer: Self-pay | Admitting: Radiation Oncology

## 2013-12-13 VITALS — BP 138/67 | HR 81 | Temp 97.9°F | Ht 69.0 in | Wt 205.6 lb

## 2013-12-13 DIAGNOSIS — Z8521 Personal history of malignant neoplasm of larynx: Secondary | ICD-10-CM | POA: Diagnosis not present

## 2013-12-13 DIAGNOSIS — Z8546 Personal history of malignant neoplasm of prostate: Secondary | ICD-10-CM

## 2013-12-13 DIAGNOSIS — C61 Malignant neoplasm of prostate: Secondary | ICD-10-CM | POA: Diagnosis not present

## 2013-12-13 DIAGNOSIS — Z510 Encounter for antineoplastic radiation therapy: Secondary | ICD-10-CM | POA: Diagnosis not present

## 2013-12-13 NOTE — Progress Notes (Signed)
Please see the Nurse Progress Note in the MD Initial Consult Encounter for this patient. 

## 2013-12-13 NOTE — Progress Notes (Signed)
Radiation Oncology         [phone] ________________________________  Name: [name] MRN: [id number]  Date: 12/13/2013  DOB: 22-Feb-1931  Follow-Up Visit Note  CC: Cathlean Cower, MD  Irine Seal, MD  Diagnosis:  1. stage TI C  adenocarcinoma the prostate  2. history of laryngeal carcinoma     Narrative:  The patient returns today for further evaluation concerning his adenocarcinoma of the prostate. 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 Patient was felt to have recurrence or persistent disease and ended up undergoing a laryngectomy for management of this issue. Patient continues to follow closely with Dr. Radene Journey with no signs of recurrence of his laryngeal carcinoma. He did require recent stoma dilation but no signs of recurrence.  Patient was seen in March of 2011 for consideration for definitive treatment for the patient's stage TI C adenocarcinoma prostate.  the patient elected not to pursue treatment at that time. Patient did continue to follow closely with [name] with progressive elevation of the patient's PSA. Patient's last PSA had increased to 19.03 previous reading in August of 2014 showed a PSA of 15.92. Patient was seen by [name] in February of this year. Patient continues to have a palpable nodule in the right apex of the prostate. The prostate  size was estimated to be 2+. He did undergo a repeat bone scan which showed no evidence of metastasis. At this time the patient has decided to pursue definitive treatment of his prostate cancer and is now seen in radiation oncology. In light of patient's age,  he is not felt to be a good surgical candidate.                              ALLERGIES:  is allergic to terazosin.  Meds: Current Outpatient Prescriptions  Medication Sig Dispense Refill  . aspirin 81 MG tablet Take 81 mg by mouth daily.      Marland Kitchen lisinopril (PRINIVIL,ZESTRIL) 40 MG  tablet 1/2 by mouth once daily      . Multiple Vitamin (MULTIVITAMIN) tablet Take 1 tablet by mouth daily.      . pantoprazole (PROTONIX) 40 MG tablet Take 40 mg by mouth 2 (two) times daily.      . ranitidine (ZANTAC) 150 MG capsule Take 150 mg by mouth every evening.       . Saw Palmetto 1000 MG CAPS 5 by mouth daily      . zolpidem (AMBIEN) 10 MG tablet Take 10 mg by mouth at bedtime as needed.      . pantoprazole (PROTONIX) 40 MG tablet Take 1 tablet (40 mg total) by mouth daily.  30 tablet  11   No current facility-administered medications for this encounter.    Physical Findings: The patient is in no acute distress. Patient is alert and oriented.  Accompanied by his wife on evaluation today  height is 5\' 9"  (1.753 m) and weight is 205 lb 9.6 oz (93.26 kg). His temperature is 97.9 F (36.6 C). His blood pressure is 138/67 and his pulse is 81. His oxygen saturation is 98%. .  The patient has hyperpigmentation changes in the neck from his prior radiation therapy at the [facility]. No palpable supraclavicular adenopathy. The lungs are clear. The heart has regular rhythm and rate. Prostate exam as not performed in light of Dr. Ralene Muskrat recent exam.  Lab Findings: Lab Results  Component Value Date   WBC 6.5 12/10/2011   HGB 15.7 10/12/2013   HCT 42.2 12/10/2011   MCV 87.9 12/10/2011   PLT 265.0 12/10/2011      Radiographic Findings: Nm Bone Scan Whole Body  11/22/2013   CLINICAL DATA:  Prostate cancer.  EXAM: NUCLEAR MEDICINE WHOLE BODY BONE SCAN  TECHNIQUE: Whole body anterior and posterior images were obtained approximately 3 hours after intravenous injection of radiopharmaceutical.  COMPARISON:  10/03/2009  RADIOPHARMACEUTICALS:  26.1 mCi of Technetium-99 MDP  FINDINGS: There is slight increased activity of the superior endplate of L5 consistent with degenerative disc disease. The activity seen at the left ischial tuberosity on the prior study has resolved.  Otherwise normal exam.   IMPRESSION: 1. Resolution of the abnormal activity in the left ischial tuberosity. 2. Slight increased activity in the superior endplate of L5 consistent with degenerative disc disease. 3. No findings worrisome for metastatic disease to the skeleton.   Electronically Signed   By: Rozetta Nunnery M.D.   On: 11/22/2013 15:27    Impression:  Stage TI C. Gleason's 7 adenocarcinoma prostate. Patient continues to have a potentially curable situation and he does wish to proceed with a definitive course of treatment at this time. I discussed the treatment course,  side effects and potential toxicities of radiation therapy in this situation with the patient. He appears to understand and wish to proceed with planned course of treatment. The patient will be referred back to [name] for fiducial marker placement.  the patient may benefit from short-term androgen ablation and will let [name] address this issue at the time of fiducial marker placement.  Plan:  The patient will return for simulation and planning once he has had placement of his fiducial markers.  ____________________________________ Blair Promise, MD

## 2013-12-24 ENCOUNTER — Telehealth: Payer: Self-pay | Admitting: *Deleted

## 2013-12-24 NOTE — Telephone Encounter (Signed)
CALLED PATIENT TO INFORM OF GOLD SEED PLACEMENT FOR 01-05-14 - ARRIVAL TIME - 11:15 AM @ DR. WRENN'S OFFICE AND HIS SIM ON 01-10-14 @ 3 PM @ DR. KINARD'S OFFICE, SPOKE WITH PATIENT AND HE IS AWARE OF THESE APPTS.

## 2014-01-05 DIAGNOSIS — C61 Malignant neoplasm of prostate: Secondary | ICD-10-CM | POA: Diagnosis not present

## 2014-01-08 ENCOUNTER — Encounter (HOSPITAL_COMMUNITY): Payer: Self-pay | Admitting: Emergency Medicine

## 2014-01-08 ENCOUNTER — Emergency Department (HOSPITAL_COMMUNITY)
Admission: EM | Admit: 2014-01-08 | Discharge: 2014-01-08 | Disposition: A | Discharge status: Home / Self Care | Payer: Medicare Other | Attending: Emergency Medicine | Admitting: Emergency Medicine

## 2014-01-08 DIAGNOSIS — Z87891 Personal history of nicotine dependence: Secondary | ICD-10-CM | POA: Diagnosis not present

## 2014-01-08 DIAGNOSIS — Z8669 Personal history of other diseases of the nervous system and sense organs: Secondary | ICD-10-CM | POA: Insufficient documentation

## 2014-01-08 DIAGNOSIS — N390 Urinary tract infection, site not specified: Secondary | ICD-10-CM | POA: Insufficient documentation

## 2014-01-08 DIAGNOSIS — Z79899 Other long term (current) drug therapy: Secondary | ICD-10-CM | POA: Insufficient documentation

## 2014-01-08 DIAGNOSIS — Z8709 Personal history of other diseases of the respiratory system: Secondary | ICD-10-CM | POA: Insufficient documentation

## 2014-01-08 DIAGNOSIS — Z7982 Long term (current) use of aspirin: Secondary | ICD-10-CM | POA: Diagnosis not present

## 2014-01-08 DIAGNOSIS — K219 Gastro-esophageal reflux disease without esophagitis: Secondary | ICD-10-CM | POA: Insufficient documentation

## 2014-01-08 DIAGNOSIS — Z8521 Personal history of malignant neoplasm of larynx: Secondary | ICD-10-CM | POA: Insufficient documentation

## 2014-01-08 DIAGNOSIS — Z8639 Personal history of other endocrine, nutritional and metabolic disease: Secondary | ICD-10-CM | POA: Insufficient documentation

## 2014-01-08 DIAGNOSIS — Z85828 Personal history of other malignant neoplasm of skin: Secondary | ICD-10-CM | POA: Diagnosis not present

## 2014-01-08 DIAGNOSIS — Z8546 Personal history of malignant neoplasm of prostate: Secondary | ICD-10-CM | POA: Diagnosis not present

## 2014-01-08 DIAGNOSIS — I10 Essential (primary) hypertension: Secondary | ICD-10-CM | POA: Diagnosis not present

## 2014-01-08 DIAGNOSIS — Z8659 Personal history of other mental and behavioral disorders: Secondary | ICD-10-CM | POA: Insufficient documentation

## 2014-01-08 DIAGNOSIS — Z862 Personal history of diseases of the blood and blood-forming organs and certain disorders involving the immune mechanism: Secondary | ICD-10-CM | POA: Insufficient documentation

## 2014-01-08 LAB — COMPREHENSIVE METABOLIC PANEL
ALK PHOS: 67 U/L (ref 39–117)
ALT: 26 U/L (ref 0–53)
AST: 25 U/L (ref 0–37)
Albumin: 3.7 g/dL (ref 3.5–5.2)
BILIRUBIN TOTAL: 0.9 mg/dL (ref 0.3–1.2)
BUN: 13 mg/dL (ref 6–23)
CHLORIDE: 99 meq/L (ref 96–112)
CO2: 26 mEq/L (ref 19–32)
Calcium: 9.2 mg/dL (ref 8.4–10.5)
Creatinine, Ser: 1.11 mg/dL (ref 0.50–1.35)
GFR calc Af Amer: 70 mL/min — ABNORMAL LOW (ref >90)
GFR calc non Af Amer: 60 mL/min — ABNORMAL LOW (ref >90)
Glucose, Bld: 125 mg/dL — ABNORMAL HIGH (ref 70–99)
POTASSIUM: 4.2 meq/L (ref 3.7–5.3)
Sodium: 137 mEq/L (ref 137–147)
Total Protein: 7.2 g/dL (ref 6.0–8.3)

## 2014-01-08 LAB — URINALYSIS, ROUTINE W REFLEX MICROSCOPIC
Bilirubin Urine: NEGATIVE
GLUCOSE, UA: NEGATIVE mg/dL
Ketones, ur: NEGATIVE mg/dL
Nitrite: NEGATIVE
PROTEIN: 30 mg/dL — AB
SPECIFIC GRAVITY, URINE: 1.019 (ref 1.005–1.030)
Urobilinogen, UA: 0.2 mg/dL (ref 0.0–1.0)
pH: 7.5 (ref 5.0–8.0)

## 2014-01-08 LAB — CBC
HEMATOCRIT: 42.2 % (ref 39.0–52.0)
Hemoglobin: 14.3 g/dL (ref 13.0–17.0)
MCH: 29.3 pg (ref 26.0–34.0)
MCHC: 33.9 g/dL (ref 30.0–36.0)
MCV: 86.5 fL (ref 78.0–100.0)
Platelets: 179 10*3/uL (ref 150–400)
RBC: 4.88 MIL/uL (ref 4.22–5.81)
RDW: 12.8 % (ref 11.5–15.5)
WBC: 16.7 10*3/uL — AB (ref 4.0–10.5)

## 2014-01-08 LAB — URINE MICROSCOPIC-ADD ON

## 2014-01-08 MED ORDER — DEXTROSE 5 % IV SOLN
1.0000 g | INTRAVENOUS | Status: DC
  Administered 2014-01-08: 1 g via INTRAVENOUS
  Filled 2014-01-08: qty 10

## 2014-01-08 MED ORDER — CEPHALEXIN 500 MG PO CAPS: 500.0000 mg | ORAL_CAPSULE | Freq: Four times a day (QID) | ORAL | Status: DC

## 2014-01-08 MED ORDER — SODIUM CHLORIDE 0.9 % IV BOLUS (SEPSIS)
1000.0000 mL | Freq: Once | INTRAVENOUS | Status: AC
  Administered 2014-01-08: 1000 mL via INTRAVENOUS

## 2014-01-08 NOTE — Discharge Instructions (Signed)
Rest. Drink plenty of fluids. The lab tests show a urine infection - take keflex, antibiotic, as prescribed. A urine culture was sent the results of which will be back in 2-3 days.  Follow up with your urologist this Monday or Tuesday - have them follow up on your urine culture results then. Return to ER right away if worse, unable to void, vomiting, weak/faint, other concern.    Urinary Tract Infection Urinary tract infections (UTIs) can develop anywhere along your urinary tract. Your urinary tract is your body's drainage system for removing wastes and extra water. Your urinary tract includes two kidneys, two ureters, a bladder, and a urethra. Your kidneys are a pair of bean-shaped organs. Each kidney is about the size of your fist. They are located below your ribs, one on each side of your spine. CAUSES Infections are caused by microbes, which are microscopic organisms, including fungi, viruses, and bacteria. These organisms are so small that they can only be seen through a microscope. Bacteria are the microbes that most commonly cause UTIs. SYMPTOMS  Symptoms of UTIs may vary by age and gender of the patient and by the location of the infection. Symptoms in young women typically include a frequent and intense urge to urinate and a painful, burning feeling in the bladder or urethra during urination. Older women and men are more likely to be tired, shaky, and weak and have muscle aches and abdominal pain. A fever may mean the infection is in your kidneys. Other symptoms of a kidney infection include pain in your back or sides below the ribs, nausea, and vomiting. DIAGNOSIS To diagnose a UTI, your caregiver will ask you about your symptoms. Your caregiver also will ask to provide a urine sample. The urine sample will be tested for bacteria and white blood cells. White blood cells are made by your body to help fight infection. TREATMENT  Typically, UTIs can be treated with medication. Because most UTIs  are caused by a bacterial infection, they usually can be treated with the use of antibiotics. The choice of antibiotic and length of treatment depend on your symptoms and the type of bacteria causing your infection. HOME CARE INSTRUCTIONS  If you were prescribed antibiotics, take them exactly as your caregiver instructs you. Finish the medication even if you feel better after you have only taken some of the medication.  Drink enough water and fluids to keep your urine clear or pale yellow.  Avoid caffeine, tea, and carbonated beverages. They tend to irritate your bladder.  Empty your bladder often. Avoid holding urine for long periods of time.  Empty your bladder before and after sexual intercourse.  After a bowel movement, women should cleanse from front to back. Use each tissue only once. SEEK MEDICAL CARE IF:   You have back pain.  You develop a fever.  Your symptoms do not begin to resolve within 3 days. SEEK IMMEDIATE MEDICAL CARE IF:   You have severe back pain or lower abdominal pain.  You develop chills.  You have nausea or vomiting.  You have continued burning or discomfort with urination. MAKE SURE YOU:   Understand these instructions.  Will watch your condition.  Will get help right away if you are not doing well or get worse. Document Released: 06/12/2005 Document Revised: 03/03/2012 Document Reviewed: 10/11/2011 [facility] Patient Information 2014 [place].

## 2014-01-08 NOTE — ED Notes (Signed)
Pt from home reports inability to urinate since last pm. Pt has hx of BPH. Pt denies abd or back pain, except when he tries to urinate. [name] A&O and in NAD

## 2014-01-08 NOTE — ED Provider Notes (Signed)
CSN: [id number]     Arrival date & time 01/08/14  1558 History   First MD Initiated Contact with Patient 01/08/14 1634     Chief Complaint  Patient presents with  . Urinary Retention     (Consider location/radiation/quality/duration/timing/severity/associated sxs/prior Treatment) The history is provided by the patient.  pt with hx prostate cancer, recent seed implants 4/22, c/o inability to urinate since last pm. States has urinated small amounts today, but feels unable to empty bladder. Mild suprapubic fullness. No severe abdominal or flank pain. Denies fever or chills, although temp in ED 100. States is eating and drinking, although possibly less than normal. Denies cough or uri c/o. No cp or sob.      Past Medical History  Diagnosis Date  . Laryngeal cancer      Dr Gwenlyn Saran  . GERD (gastroesophageal reflux disease)   . Adenocarcinoma of prostate, stage 1   . HYPERLIPIDEMIA   . ANXIETY   . HYPERTENSION   . ALLERGIC RHINITIS   . GERD   . DIVERTICULOSIS, COLON 07/06/2003  . BENIGN PROSTATIC HYPERTROPHY   . History of prostatitis   . Impaired glucose tolerance   . UTI (lower urinary tract infection) 2005  . Diverticulosis 07/06/2003  . Third nerve palsy 2005    painful, transient  . Esophageal stricture   . Squamous cell carcinoma   . Full dentures   . Wears glasses    Past Surgical History  Procedure Laterality Date  . Lipoma excision      abd wall  . Tonsillectomy    . Total laryngectomy    . Esophageal dilation  01/10/2012    Dr. Lucia Gaskins  . Neck surgery  01/10/2012    tumor removed from back of neck  . Mass excision  01/10/2012    Procedure: EXCISION MASS;  Surgeon: Rozetta Nunnery, MD;  Location: [place];  Service: ENT;  Laterality: N/A;  posterior neck mass  . Tracheostomy revision N/A 10/12/2013    Procedure: TRACHEOSTOMA  REVSION w/ CO2 LASER;  Surgeon: Rozetta Nunnery, MD;  Location: [place];  Service: ENT;   Laterality: N/A;  . Esophageal dilation N/A 10/12/2013    Procedure: ESOPHAGEAL DILATION;  Surgeon: Rozetta Nunnery, MD;  Location: [place];  Service: ENT;  Laterality: N/A;  . Prostate biopsy  04/29/2005   Family History  Problem Relation Age of Onset  . Hypertension Father   . Diabetes Brother   . Diabetes Brother   . Heart attack Father 30  . Colon cancer Neg Hx    History  Substance Use Topics  . Smoking status: Former Smoker -- 1.00 packs/day for 25 years    Types: Cigarettes    Quit date: 01/07/1987  . Smokeless tobacco: Never Used  . Alcohol Use: No    Review of Systems  Constitutional: Negative for chills and diaphoresis.  HENT: Negative for sore throat.   Eyes: Negative for redness.  Respiratory: Negative for cough and shortness of breath.   Cardiovascular: Negative for chest pain.  Gastrointestinal: Negative for vomiting, abdominal pain and diarrhea.  Genitourinary: Negative for dysuria and flank pain.  Musculoskeletal: Negative for back pain and neck pain.  Skin: Negative for rash.  Neurological: Negative for headaches.  Hematological: Does not bruise/bleed easily.  Psychiatric/Behavioral: Negative for confusion.      Allergies  Terazosin  Home Medications   Prior to Admission medications   Medication Sig Start Date End Date Taking?  Authorizing Provider  aspirin 81 MG tablet Take 81 mg by mouth daily.    Historical Provider, MD  lisinopril (PRINIVIL,ZESTRIL) 40 MG tablet 1/2 by mouth once daily    Historical Provider, MD  Multiple Vitamin (MULTIVITAMIN) tablet Take 1 tablet by mouth daily.    Historical Provider, MD  pantoprazole (PROTONIX) 40 MG tablet Take 1 tablet (40 mg total) by mouth daily. 04/10/12 04/10/13  Sable Feil, MD  pantoprazole (PROTONIX) 40 MG tablet Take 40 mg by mouth 2 (two) times daily.    Historical Provider, MD  ranitidine (ZANTAC) 150 MG capsule Take 150 mg by mouth every evening.     Historical Provider,  MD  Saw Palmetto 1000 MG CAPS 5 by mouth daily    Historical Provider, MD  zolpidem (AMBIEN) 10 MG tablet Take 10 mg by mouth at bedtime as needed.    Historical Provider, MD   BP 132/61  Pulse 109  Temp(Src) 100 F (37.8 C) (Oral)  Resp 16  SpO2 97% Physical Exam  Nursing note and vitals reviewed. Constitutional: He is oriented to person, place, and time. He appears well-developed and well-nourished. No distress.  HENT:  Head: Atraumatic.  Mouth/Throat: Oropharynx is clear and moist.  Eyes: Conjunctivae are normal. No scleral icterus.  Neck: Neck supple. No tracheal deviation present.  Cardiovascular: Normal rate, normal heart sounds and intact distal pulses.   Pulmonary/Chest: Effort normal and breath sounds normal. No accessory muscle usage. No respiratory distress.  Abdominal: Soft. Bowel sounds are normal. He exhibits no distension and no mass. There is no tenderness. There is no rebound and no guarding.  Genitourinary:  No cva tenderness. Normal external genitalia. No scrotal/testicular pain, swelling, or tenderness. No penile d/c.   Musculoskeletal: Normal range of motion.  Neurological: He is alert and oriented to person, place, and time.  Skin: Skin is warm and dry. He is not diaphoretic.  Psychiatric: He has a normal mood and affect.    ED Course  Procedures (including critical care time)  Results for orders placed during the hospital encounter of 01/08/14  URINALYSIS, ROUTINE W REFLEX MICROSCOPIC      Result Value Ref Range   Color, Urine AMBER (*) YELLOW   APPearance CLOUDY (*) CLEAR   Specific Gravity, Urine 1.019  1.005 - 1.030   pH 7.5  5.0 - 8.0   Glucose, UA NEGATIVE  NEGATIVE mg/dL   Hgb urine dipstick SMALL (*) NEGATIVE   Bilirubin Urine NEGATIVE  NEGATIVE   Ketones, ur NEGATIVE  NEGATIVE mg/dL   Protein, ur 30 (*) NEGATIVE mg/dL   Urobilinogen, UA 0.2  0.0 - 1.0 mg/dL   Nitrite NEGATIVE  NEGATIVE   Leukocytes, UA MODERATE (*) NEGATIVE  CBC       Result Value Ref Range   WBC 16.7 (*) 4.0 - 10.5 K/uL   RBC 4.88  4.22 - 5.81 MIL/uL   Hemoglobin 14.3  13.0 - 17.0 g/dL   HCT 42.2  39.0 - 52.0 %   MCV 86.5  78.0 - 100.0 fL   MCH 29.3  26.0 - 34.0 pg   MCHC 33.9  30.0 - 36.0 g/dL   RDW 12.8  11.5 - 15.5 %   Platelets 179  150 - 400 K/uL  COMPREHENSIVE METABOLIC PANEL      Result Value Ref Range   Sodium 137  137 - 147 mEq/L   Potassium 4.2  3.7 - 5.3 mEq/L   Chloride 99  96 - 112 mEq/L  CO2 26  19 - 32 mEq/L   Glucose, Bld 125 (*) 70 - 99 mg/dL   BUN 13  6 - 23 mg/dL   Creatinine, Ser 1.11  0.50 - 1.35 mg/dL   Calcium 9.2  8.4 - 10.5 mg/dL   Total Protein 7.2  6.0 - 8.3 g/dL   Albumin 3.7  3.5 - 5.2 g/dL   AST 25  0 - 37 U/L   ALT 26  0 - 53 U/L   Alkaline Phosphatase 67  39 - 117 U/L   Total Bilirubin 0.9  0.3 - 1.2 mg/dL   GFR calc non Af Amer 60 (*) >90 mL/min   GFR calc Af Amer 70 (*) >90 mL/min  URINE MICROSCOPIC-ADD ON      Result Value Ref Range   WBC, UA 21-50  <3 WBC/hpf   RBC / HPF 7-10  <3 RBC/hpf   Urine-Other AMORPHOUS URATES/PHOSPHATES          MDM  Foley.  Reviewed nursing notes and prior charts for additional history.   pvr < 200 cc, however pt w uti on labs.  u cx added to labs. Rocephin iv.  Iv ns bolus.  Recheck pt asymptomatic. No abd pain or nv.  Denies chills/sweats. No faintness or dizziness. Pt requests d/c to home.  Foley d/cd. Will give rx abx. Urology follow up.    Mirna Mires, MD 01/08/14 [phone]

## 2014-01-10 ENCOUNTER — Ambulatory Visit: Admission: RE | Admit: 2014-01-10 | Payer: Medicare Other | Source: Ambulatory Visit | Admitting: Radiation Oncology

## 2014-01-10 DIAGNOSIS — N410 Acute prostatitis: Secondary | ICD-10-CM | POA: Diagnosis not present

## 2014-01-10 DIAGNOSIS — R339 Retention of urine, unspecified: Secondary | ICD-10-CM | POA: Diagnosis not present

## 2014-01-10 DIAGNOSIS — N138 Other obstructive and reflux uropathy: Secondary | ICD-10-CM | POA: Diagnosis not present

## 2014-01-10 DIAGNOSIS — N403 Nodular prostate with lower urinary tract symptoms: Secondary | ICD-10-CM | POA: Diagnosis not present

## 2014-01-10 LAB — URINE CULTURE: Colony Count: 10000

## 2014-01-11 ENCOUNTER — Telehealth (HOSPITAL_BASED_OUTPATIENT_CLINIC_OR_DEPARTMENT_OTHER): Payer: Self-pay | Admitting: Emergency Medicine

## 2014-01-11 NOTE — Telephone Encounter (Signed)
Post ED Visit - Positive Culture Follow-up  Culture report reviewed by antimicrobial stewardship pharmacist: []  Wes Hi[place]ella, Pharm.D., BCPS []  Heide Guile, Pharm.D., BCPS []  Alycia Rossetti, Pharm.D., BCPS []  [place], [place].D., BCPS, AAHIVP []  Legrand Como, Pharm.D., BCPS, AAHIVP [x]  Juliene Pina, Pharm.D.  Positive urine culture Treated with Keflex, organism sensitive to the same and no further patient follow-up is required at this time.  Myrna Blazer 01/11/2014, 12:42 PM

## 2014-01-24 DIAGNOSIS — N410 Acute prostatitis: Secondary | ICD-10-CM | POA: Diagnosis not present

## 2014-02-01 ENCOUNTER — Ambulatory Visit
Admission: RE | Admit: 2014-02-01 | Discharge: 2014-02-01 | Disposition: A | Discharge status: Home / Self Care | Payer: Medicare Other | Source: Ambulatory Visit | Attending: Radiation Oncology | Admitting: Radiation Oncology

## 2014-02-01 DIAGNOSIS — C61 Malignant neoplasm of prostate: Secondary | ICD-10-CM | POA: Diagnosis not present

## 2014-02-01 DIAGNOSIS — Z8546 Personal history of malignant neoplasm of prostate: Secondary | ICD-10-CM

## 2014-02-01 DIAGNOSIS — Z510 Encounter for antineoplastic radiation therapy: Secondary | ICD-10-CM | POA: Diagnosis not present

## 2014-02-01 DIAGNOSIS — Z8521 Personal history of malignant neoplasm of larynx: Secondary | ICD-10-CM | POA: Diagnosis not present

## 2014-02-03 NOTE — Progress Notes (Signed)
  Radiation Oncology         [phone] ________________________________  Name: [name] MRN: [id number]  Date: 02/01/2014  DOB: September 12, 1931  SIMULATION AND TREATMENT PLANNING NOTE  DIAGNOSIS:  Stage TI C., Gleason's 7 adenocarcinoma of the prostate  NARRATIVE:  The patient was brought to the [facility].  Identity was confirmed.  All relevant records and images related to the planned course of therapy were reviewed.  The patient freely provided informed written consent to proceed with treatment after reviewing the details related to the planned course of therapy. The consent form was witnessed and verified by the simulation staff.  Then, the patient was set-up in a stable reproducible  supine position for radiation therapy.  CT images were obtained.  Surface markings were placed.  The CT images were loaded into the planning software.  Then the target and avoidance structures were contoured.  Treatment planning then occurred.  The radiation prescription was entered and confirmed.  Then, I designed and supervised the construction of a total of 1 medically necessary complex treatment devices.  I have requested : Intensity Modulated Radiotherapy (IMRT) is medically necessary for this case for the following reason:  Rectal sparing..  I have ordered:dose calc.  PLAN:  The patient will receive 78 Gy in 40 fractions with daily image guidance using CT imaging and fiducial markers.  ________________________________  -----------------------------------  Blair Promise, PhD, MD

## 2014-02-08 DIAGNOSIS — Z8521 Personal history of malignant neoplasm of larynx: Secondary | ICD-10-CM | POA: Diagnosis not present

## 2014-02-08 DIAGNOSIS — C61 Malignant neoplasm of prostate: Secondary | ICD-10-CM | POA: Diagnosis not present

## 2014-02-08 DIAGNOSIS — Z510 Encounter for antineoplastic radiation therapy: Secondary | ICD-10-CM | POA: Diagnosis not present

## 2014-02-14 ENCOUNTER — Ambulatory Visit
Admission: RE | Admit: 2014-02-14 | Discharge: 2014-02-14 | Disposition: A | Discharge status: Home / Self Care | Payer: Medicare Other | Source: Ambulatory Visit | Attending: Radiation Oncology | Admitting: Radiation Oncology

## 2014-02-14 DIAGNOSIS — Z8521 Personal history of malignant neoplasm of larynx: Secondary | ICD-10-CM | POA: Diagnosis not present

## 2014-02-14 DIAGNOSIS — C61 Malignant neoplasm of prostate: Secondary | ICD-10-CM | POA: Diagnosis not present

## 2014-02-14 DIAGNOSIS — Z510 Encounter for antineoplastic radiation therapy: Secondary | ICD-10-CM | POA: Diagnosis not present

## 2014-02-15 ENCOUNTER — Ambulatory Visit
Admission: RE | Admit: 2014-02-15 | Discharge: 2014-02-15 | Disposition: A | Discharge status: Home / Self Care | Payer: Medicare Other | Source: Ambulatory Visit | Attending: Radiation Oncology | Admitting: Radiation Oncology

## 2014-02-15 VITALS — BP 158/75 | HR 73 | Temp 97.9°F | Ht 69.0 in | Wt 206.3 lb

## 2014-02-15 DIAGNOSIS — Z8546 Personal history of malignant neoplasm of prostate: Secondary | ICD-10-CM

## 2014-02-15 DIAGNOSIS — Z510 Encounter for antineoplastic radiation therapy: Secondary | ICD-10-CM | POA: Diagnosis not present

## 2014-02-15 DIAGNOSIS — Z8521 Personal history of malignant neoplasm of larynx: Secondary | ICD-10-CM | POA: Diagnosis not present

## 2014-02-15 DIAGNOSIS — C61 Malignant neoplasm of prostate: Secondary | ICD-10-CM | POA: Diagnosis not present

## 2014-02-15 NOTE — Progress Notes (Signed)
[name] has completed 2/40 fractions to his prostate.  He denies dysuria, hematuria and pain.  He reports getting up 2-3 times per night to urinate.  He was given the Radiation Therapy and You book and discussed side effects/management of diarrhea, fatigue, skin changes and bladder changes.  He was oriented to the clinic and was instructed to call with any questions.

## 2014-02-15 NOTE — Progress Notes (Signed)
  Radiation Oncology         [phone] ________________________________  Name: [name] MRN: [id number]  Date: 02/15/2014  DOB: [birth year]  Weekly Radiation Therapy Management  Diagnosis: Prostate cancer  Current Dose: 3.9 Gy     Planned Dose:  78 Gy  Narrative . . . . . . . . The patient presents for routine under treatment assessment.                                   The patient is without complaint.                                 Set-up films were reviewed.                                 The chart was checked. Physical Findings. . .  height is 5\' 9"  (1.753 m) and weight is 206 lb 4.8 oz (93.577 kg). His oral temperature is 97.9 F (36.6 C). His blood pressure is 158/75 and his pulse is 73. . Weight essentially stable.  No significant changes. Impression . . . . . . . The patient is tolerating radiation. Plan . . . . . . . . . . . . Continue treatment as planned.  ________________________________   Blair Promise, PhD, MD

## 2014-02-16 ENCOUNTER — Ambulatory Visit
Admission: RE | Admit: 2014-02-16 | Discharge: 2014-02-16 | Disposition: A | Discharge status: Home / Self Care | Payer: Medicare Other | Source: Ambulatory Visit | Attending: Radiation Oncology | Admitting: Radiation Oncology

## 2014-02-16 DIAGNOSIS — Z510 Encounter for antineoplastic radiation therapy: Secondary | ICD-10-CM | POA: Diagnosis not present

## 2014-02-16 DIAGNOSIS — Z8521 Personal history of malignant neoplasm of larynx: Secondary | ICD-10-CM | POA: Diagnosis not present

## 2014-02-16 DIAGNOSIS — C61 Malignant neoplasm of prostate: Secondary | ICD-10-CM | POA: Diagnosis not present

## 2014-02-17 ENCOUNTER — Ambulatory Visit
Admission: RE | Admit: 2014-02-17 | Discharge: 2014-02-17 | Disposition: A | Discharge status: Home / Self Care | Payer: Medicare Other | Source: Ambulatory Visit | Attending: Radiation Oncology | Admitting: Radiation Oncology

## 2014-02-17 DIAGNOSIS — Z510 Encounter for antineoplastic radiation therapy: Secondary | ICD-10-CM | POA: Diagnosis not present

## 2014-02-17 DIAGNOSIS — Z8521 Personal history of malignant neoplasm of larynx: Secondary | ICD-10-CM | POA: Diagnosis not present

## 2014-02-17 DIAGNOSIS — C61 Malignant neoplasm of prostate: Secondary | ICD-10-CM | POA: Diagnosis not present

## 2014-02-18 ENCOUNTER — Ambulatory Visit
Admission: RE | Admit: 2014-02-18 | Discharge: 2014-02-18 | Disposition: A | Discharge status: Home / Self Care | Payer: Medicare Other | Source: Ambulatory Visit | Attending: Radiation Oncology | Admitting: Radiation Oncology

## 2014-02-18 DIAGNOSIS — C61 Malignant neoplasm of prostate: Secondary | ICD-10-CM | POA: Diagnosis not present

## 2014-02-18 DIAGNOSIS — Z8521 Personal history of malignant neoplasm of larynx: Secondary | ICD-10-CM | POA: Diagnosis not present

## 2014-02-18 DIAGNOSIS — Z510 Encounter for antineoplastic radiation therapy: Secondary | ICD-10-CM | POA: Diagnosis not present

## 2014-02-21 ENCOUNTER — Ambulatory Visit
Admission: RE | Admit: 2014-02-21 | Discharge: 2014-02-21 | Disposition: A | Discharge status: Home / Self Care | Payer: Medicare Other | Source: Ambulatory Visit | Attending: Radiation Oncology | Admitting: Radiation Oncology

## 2014-02-21 DIAGNOSIS — Z510 Encounter for antineoplastic radiation therapy: Secondary | ICD-10-CM | POA: Diagnosis not present

## 2014-02-21 DIAGNOSIS — C61 Malignant neoplasm of prostate: Secondary | ICD-10-CM | POA: Diagnosis not present

## 2014-02-21 DIAGNOSIS — Z8521 Personal history of malignant neoplasm of larynx: Secondary | ICD-10-CM | POA: Diagnosis not present

## 2014-02-22 ENCOUNTER — Ambulatory Visit
Admission: RE | Admit: 2014-02-22 | Discharge: 2014-02-22 | Disposition: A | Discharge status: Home / Self Care | Payer: Medicare Other | Source: Ambulatory Visit | Attending: Radiation Oncology | Admitting: Radiation Oncology

## 2014-02-22 ENCOUNTER — Encounter: Payer: Self-pay | Admitting: Radiation Oncology

## 2014-02-22 VITALS — BP 130/73 | HR 76 | Temp 97.4°F | Resp 20 | Wt 207.2 lb

## 2014-02-22 DIAGNOSIS — Z510 Encounter for antineoplastic radiation therapy: Secondary | ICD-10-CM | POA: Diagnosis not present

## 2014-02-22 DIAGNOSIS — C61 Malignant neoplasm of prostate: Secondary | ICD-10-CM | POA: Diagnosis not present

## 2014-02-22 DIAGNOSIS — Z8521 Personal history of malignant neoplasm of larynx: Secondary | ICD-10-CM | POA: Diagnosis not present

## 2014-02-22 NOTE — Progress Notes (Signed)
Pt denies pain, urinary or bowel issues, fatigue, loss of appetite.

## 2014-02-22 NOTE — Progress Notes (Signed)
  Radiation Oncology         [phone] ________________________________  Name: [name] MRN: [id number]  Date: 02/22/2014  DOB: Apr 10, 1931  Weekly Radiation Therapy Management   Diagnosis: Prostate cancer  Current Dose: 13.65 Gy     Planned Dose:  78 Gy  Narrative . . . . . . . . The patient presents for routine under treatment assessment.                                   The patient is without complaint.                                 Set-up films were reviewed.                                 The chart was checked. Physical Findings. . .  weight is 207 lb 3.2 oz (93.985 kg). His oral temperature is 97.4 F (36.3 C). His blood pressure is 130/73 and his pulse is 76. His respiration is 20. . The lungs are clear. The heart has a regular rhythm and rate. The abdomen is soft and nontender with normal bowel sounds.  Impression . . . . . . . The patient is tolerating radiation. Plan . . . . . . . . . . . . Continue treatment as planned.  ________________________________   Blair Promise, PhD, MD

## 2014-02-23 ENCOUNTER — Ambulatory Visit
Admission: RE | Admit: 2014-02-23 | Discharge: 2014-02-23 | Disposition: A | Discharge status: Home / Self Care | Payer: Medicare Other | Source: Ambulatory Visit | Attending: Radiation Oncology | Admitting: Radiation Oncology

## 2014-02-23 DIAGNOSIS — C61 Malignant neoplasm of prostate: Secondary | ICD-10-CM | POA: Diagnosis not present

## 2014-02-23 DIAGNOSIS — Z8521 Personal history of malignant neoplasm of larynx: Secondary | ICD-10-CM | POA: Diagnosis not present

## 2014-02-23 DIAGNOSIS — Z510 Encounter for antineoplastic radiation therapy: Secondary | ICD-10-CM | POA: Diagnosis not present

## 2014-02-24 ENCOUNTER — Ambulatory Visit
Admission: RE | Admit: 2014-02-24 | Discharge: 2014-02-24 | Disposition: A | Discharge status: Home / Self Care | Payer: Medicare Other | Source: Ambulatory Visit | Attending: Radiation Oncology | Admitting: Radiation Oncology

## 2014-02-24 DIAGNOSIS — C61 Malignant neoplasm of prostate: Secondary | ICD-10-CM | POA: Diagnosis not present

## 2014-02-24 DIAGNOSIS — Z8521 Personal history of malignant neoplasm of larynx: Secondary | ICD-10-CM | POA: Diagnosis not present

## 2014-02-24 DIAGNOSIS — Z510 Encounter for antineoplastic radiation therapy: Secondary | ICD-10-CM | POA: Diagnosis not present

## 2014-02-25 ENCOUNTER — Ambulatory Visit
Admission: RE | Admit: 2014-02-25 | Discharge: 2014-02-25 | Disposition: A | Discharge status: Home / Self Care | Payer: Medicare Other | Source: Ambulatory Visit | Attending: Radiation Oncology | Admitting: Radiation Oncology

## 2014-02-25 DIAGNOSIS — Z8521 Personal history of malignant neoplasm of larynx: Secondary | ICD-10-CM | POA: Diagnosis not present

## 2014-02-25 DIAGNOSIS — C61 Malignant neoplasm of prostate: Secondary | ICD-10-CM | POA: Diagnosis not present

## 2014-02-25 DIAGNOSIS — Z510 Encounter for antineoplastic radiation therapy: Secondary | ICD-10-CM | POA: Diagnosis not present

## 2014-02-28 ENCOUNTER — Ambulatory Visit
Admission: RE | Admit: 2014-02-28 | Discharge: 2014-02-28 | Disposition: A | Discharge status: Home / Self Care | Payer: Medicare Other | Source: Ambulatory Visit | Attending: Radiation Oncology | Admitting: Radiation Oncology

## 2014-02-28 DIAGNOSIS — Z8521 Personal history of malignant neoplasm of larynx: Secondary | ICD-10-CM | POA: Diagnosis not present

## 2014-02-28 DIAGNOSIS — C61 Malignant neoplasm of prostate: Secondary | ICD-10-CM | POA: Diagnosis not present

## 2014-02-28 DIAGNOSIS — Z510 Encounter for antineoplastic radiation therapy: Secondary | ICD-10-CM | POA: Diagnosis not present

## 2014-03-01 ENCOUNTER — Ambulatory Visit
Admission: RE | Admit: 2014-03-01 | Discharge: 2014-03-01 | Disposition: A | Discharge status: Home / Self Care | Payer: Medicare Other | Source: Ambulatory Visit | Attending: Radiation Oncology | Admitting: Radiation Oncology

## 2014-03-01 VITALS — BP 115/72 | HR 74 | Temp 98.1°F | Resp 16 | Ht 69.0 in | Wt 206.9 lb

## 2014-03-01 DIAGNOSIS — C61 Malignant neoplasm of prostate: Secondary | ICD-10-CM

## 2014-03-01 DIAGNOSIS — Z8521 Personal history of malignant neoplasm of larynx: Secondary | ICD-10-CM | POA: Diagnosis not present

## 2014-03-01 DIAGNOSIS — Z510 Encounter for antineoplastic radiation therapy: Secondary | ICD-10-CM | POA: Diagnosis not present

## 2014-03-01 NOTE — Progress Notes (Signed)
[name] has had 12 fractions to his prostate.  He denies pain, dysuria, hematuria, fatigue and an increase in urinary frequency.  He reports getting up 3-4 times per night to urinate.  He also reports mild constipation.

## 2014-03-01 NOTE — Progress Notes (Signed)
  Radiation Oncology         [phone] ________________________________  Name: [name] MRN: [id number]  Date: 03/01/2014  DOB: 1931-08-30  Weekly Radiation Therapy Management  Diagnosis :Prostate cancer  Current Dose: 23.4 Gy     Planned Dose:  78 Gy  Narrative . . . . . . . . The patient presents for routine under treatment assessment.                                   The patient is without complaint. He denies any dysuria fatigue or urinary symptoms. He has nocturia x4 which is his baseline. He has mild constipation                                 Set-up films were reviewed.                                 The chart was checked. Physical Findings. . .  height is 5\' 9"  (1.753 m) and weight is 206 lb 14.4 oz (93.849 kg). His oral temperature is 98.1 F (36.7 C). His blood pressure is 115/72 and his pulse is 74. His respiration is 16. . Weight essentially stable.  No significant changes. Impression . . . . . . . The patient is tolerating radiation. Plan . . . . . . . . . . . . Continue treatment as planned.  ________________________________   Blair Promise, PhD, MD

## 2014-03-02 ENCOUNTER — Ambulatory Visit
Admission: RE | Admit: 2014-03-02 | Discharge: 2014-03-02 | Disposition: A | Discharge status: Home / Self Care | Payer: Medicare Other | Source: Ambulatory Visit | Attending: Radiation Oncology | Admitting: Radiation Oncology

## 2014-03-02 DIAGNOSIS — Z8521 Personal history of malignant neoplasm of larynx: Secondary | ICD-10-CM | POA: Diagnosis not present

## 2014-03-02 DIAGNOSIS — C61 Malignant neoplasm of prostate: Secondary | ICD-10-CM | POA: Diagnosis not present

## 2014-03-02 DIAGNOSIS — Z510 Encounter for antineoplastic radiation therapy: Secondary | ICD-10-CM | POA: Diagnosis not present

## 2014-03-03 ENCOUNTER — Ambulatory Visit
Admission: RE | Admit: 2014-03-03 | Discharge: 2014-03-03 | Disposition: A | Discharge status: Home / Self Care | Payer: Medicare Other | Source: Ambulatory Visit | Attending: Radiation Oncology | Admitting: Radiation Oncology

## 2014-03-03 DIAGNOSIS — Z510 Encounter for antineoplastic radiation therapy: Secondary | ICD-10-CM | POA: Diagnosis not present

## 2014-03-03 DIAGNOSIS — C61 Malignant neoplasm of prostate: Secondary | ICD-10-CM | POA: Diagnosis not present

## 2014-03-03 DIAGNOSIS — Z8521 Personal history of malignant neoplasm of larynx: Secondary | ICD-10-CM | POA: Diagnosis not present

## 2014-03-04 ENCOUNTER — Ambulatory Visit
Admission: RE | Admit: 2014-03-04 | Discharge: 2014-03-04 | Disposition: A | Discharge status: Home / Self Care | Payer: Medicare Other | Source: Ambulatory Visit | Attending: Radiation Oncology | Admitting: Radiation Oncology

## 2014-03-04 DIAGNOSIS — Z8521 Personal history of malignant neoplasm of larynx: Secondary | ICD-10-CM | POA: Diagnosis not present

## 2014-03-04 DIAGNOSIS — C61 Malignant neoplasm of prostate: Secondary | ICD-10-CM | POA: Diagnosis not present

## 2014-03-04 DIAGNOSIS — Z510 Encounter for antineoplastic radiation therapy: Secondary | ICD-10-CM | POA: Diagnosis not present

## 2014-03-07 ENCOUNTER — Ambulatory Visit
Admission: RE | Admit: 2014-03-07 | Discharge: 2014-03-07 | Disposition: A | Discharge status: Home / Self Care | Payer: Medicare Other | Source: Ambulatory Visit | Attending: Radiation Oncology | Admitting: Radiation Oncology

## 2014-03-07 DIAGNOSIS — Z510 Encounter for antineoplastic radiation therapy: Secondary | ICD-10-CM | POA: Diagnosis not present

## 2014-03-07 DIAGNOSIS — C61 Malignant neoplasm of prostate: Secondary | ICD-10-CM | POA: Diagnosis not present

## 2014-03-07 DIAGNOSIS — Z8521 Personal history of malignant neoplasm of larynx: Secondary | ICD-10-CM | POA: Diagnosis not present

## 2014-03-08 ENCOUNTER — Ambulatory Visit
Admission: RE | Admit: 2014-03-08 | Discharge: 2014-03-08 | Disposition: A | Discharge status: Home / Self Care | Payer: Medicare Other | Source: Ambulatory Visit | Attending: Radiation Oncology | Admitting: Radiation Oncology

## 2014-03-08 VITALS — BP 126/75 | HR 85 | Temp 98.1°F | Ht 69.0 in | Wt 205.8 lb

## 2014-03-08 DIAGNOSIS — C61 Malignant neoplasm of prostate: Secondary | ICD-10-CM | POA: Diagnosis not present

## 2014-03-08 DIAGNOSIS — Z510 Encounter for antineoplastic radiation therapy: Secondary | ICD-10-CM | POA: Diagnosis not present

## 2014-03-08 DIAGNOSIS — Z8521 Personal history of malignant neoplasm of larynx: Secondary | ICD-10-CM | POA: Diagnosis not present

## 2014-03-08 NOTE — Progress Notes (Signed)
[name] has completed 17/40 fractions to his prostate.  He denies pain, dysuria, hematuria, fatigue and diarrhea.  He reports a slight increase in urinary frequency during the day.  He reports continuing to get up 3-4 times per night to urinate.

## 2014-03-08 NOTE — Progress Notes (Signed)
Weekly Management Note Current Dose: 33.15  Gy  Projected Dose: 78 Gy   Narrative:  The patient presents for routine under treatment assessment.  CBCT/MVCT images/Port film x-rays were reviewed.  The chart was checked. Doing well. No complaints. No diarrhea. Stable frequency (self induced by scheduled urination so as not to develop urge incontinence) No pain.   Physical Findings: Weight: 205 lb 12.8 oz (93.35 kg). Unchanged  Impression:  The patient is tolerating radiation.  Plan:  Continue treatment as planned.

## 2014-03-09 ENCOUNTER — Ambulatory Visit
Admission: RE | Admit: 2014-03-09 | Discharge: 2014-03-09 | Disposition: A | Discharge status: Home / Self Care | Payer: Medicare Other | Source: Ambulatory Visit | Attending: Radiation Oncology | Admitting: Radiation Oncology

## 2014-03-09 DIAGNOSIS — C61 Malignant neoplasm of prostate: Secondary | ICD-10-CM | POA: Diagnosis not present

## 2014-03-09 DIAGNOSIS — Z8521 Personal history of malignant neoplasm of larynx: Secondary | ICD-10-CM | POA: Diagnosis not present

## 2014-03-09 DIAGNOSIS — Z510 Encounter for antineoplastic radiation therapy: Secondary | ICD-10-CM | POA: Diagnosis not present

## 2014-03-10 ENCOUNTER — Ambulatory Visit
Admission: RE | Admit: 2014-03-10 | Discharge: 2014-03-10 | Disposition: A | Discharge status: Home / Self Care | Payer: Medicare Other | Source: Ambulatory Visit | Attending: Radiation Oncology | Admitting: Radiation Oncology

## 2014-03-10 DIAGNOSIS — Z8521 Personal history of malignant neoplasm of larynx: Secondary | ICD-10-CM | POA: Diagnosis not present

## 2014-03-10 DIAGNOSIS — C61 Malignant neoplasm of prostate: Secondary | ICD-10-CM | POA: Diagnosis not present

## 2014-03-10 DIAGNOSIS — Z510 Encounter for antineoplastic radiation therapy: Secondary | ICD-10-CM | POA: Diagnosis not present

## 2014-03-11 ENCOUNTER — Ambulatory Visit
Admission: RE | Admit: 2014-03-11 | Discharge: 2014-03-11 | Disposition: A | Discharge status: Home / Self Care | Payer: Medicare Other | Source: Ambulatory Visit | Attending: Radiation Oncology | Admitting: Radiation Oncology

## 2014-03-11 DIAGNOSIS — Z510 Encounter for antineoplastic radiation therapy: Secondary | ICD-10-CM | POA: Diagnosis not present

## 2014-03-11 DIAGNOSIS — C61 Malignant neoplasm of prostate: Secondary | ICD-10-CM | POA: Diagnosis not present

## 2014-03-11 DIAGNOSIS — Z8521 Personal history of malignant neoplasm of larynx: Secondary | ICD-10-CM | POA: Diagnosis not present

## 2014-03-14 ENCOUNTER — Ambulatory Visit
Admission: RE | Admit: 2014-03-14 | Discharge: 2014-03-14 | Disposition: A | Discharge status: Home / Self Care | Payer: Medicare Other | Source: Ambulatory Visit | Attending: Radiation Oncology | Admitting: Radiation Oncology

## 2014-03-14 DIAGNOSIS — Z510 Encounter for antineoplastic radiation therapy: Secondary | ICD-10-CM | POA: Diagnosis not present

## 2014-03-14 DIAGNOSIS — Z8521 Personal history of malignant neoplasm of larynx: Secondary | ICD-10-CM | POA: Insufficient documentation

## 2014-03-14 DIAGNOSIS — C61 Malignant neoplasm of prostate: Secondary | ICD-10-CM | POA: Diagnosis not present

## 2014-03-15 ENCOUNTER — Encounter: Payer: Self-pay | Admitting: Radiation Oncology

## 2014-03-15 ENCOUNTER — Ambulatory Visit
Admission: RE | Admit: 2014-03-15 | Discharge: 2014-03-15 | Disposition: A | Discharge status: Home / Self Care | Payer: Medicare Other | Source: Ambulatory Visit | Attending: Radiation Oncology | Admitting: Radiation Oncology

## 2014-03-15 VITALS — BP 129/64 | HR 87 | Temp 97.6°F | Ht 69.0 in | Wt 205.7 lb

## 2014-03-15 DIAGNOSIS — C61 Malignant neoplasm of prostate: Secondary | ICD-10-CM

## 2014-03-15 DIAGNOSIS — Z510 Encounter for antineoplastic radiation therapy: Secondary | ICD-10-CM | POA: Diagnosis not present

## 2014-03-15 DIAGNOSIS — Z8521 Personal history of malignant neoplasm of larynx: Secondary | ICD-10-CM | POA: Diagnosis not present

## 2014-03-15 NOTE — Progress Notes (Signed)
  Radiation Oncology         [phone] ________________________________  Name: [name] MRN: [id number]  Date: 03/15/2014  DOB: 06-11-1931  Weekly Radiation Therapy Management  DIAGNOSIS: Stage TI C., Gleason's 7 adenocarcinoma of the prostate   Current Dose: 42.9 Gy     Planned Dose:  78 Gy  Narrative . . . . . . . . The patient presents for routine under treatment assessment.                                   The patient has noticed increased urinary frequency during the daytime and at night. He also feels he may not be completely emptying his bladder. I discussed consideration for Flomax but the patient feels sure he did not tolerate this medication well when given to him by urology.                                  Set-up films were reviewed.                                 The chart was checked. Physical Findings. . .  height is 5\' 9"  (1.753 m) and weight is 205 lb 11.2 oz (93.305 kg). His oral temperature is 97.6 F (36.4 C). His blood pressure is 129/64 and his pulse is 87. . Weight essentially stable.  No significant changes. Impression . . . . . . . The patient is tolerating radiation. Plan . . . . . . . . . . . . Continue treatment as planned.  ________________________________   Blair Promise, PhD, MD

## 2014-03-15 NOTE — Progress Notes (Signed)
[name] has completed 22 fractions to his prostate.  He denies pain, dysuria, hematuria and fatigue.  He reports having loose stools over the weekend.  He took Imodium and they stopped.  He also has noticed an increase in urinary frequency with urinating every 45 minutes to an hour day and night.  He says he does not feel like he is emptying his bladder completely.  His urinary stream has not changed in strength.

## 2014-03-16 ENCOUNTER — Ambulatory Visit
Admission: RE | Admit: 2014-03-16 | Discharge: 2014-03-16 | Disposition: A | Discharge status: Home / Self Care | Payer: Medicare Other | Source: Ambulatory Visit | Attending: Radiation Oncology | Admitting: Radiation Oncology

## 2014-03-16 DIAGNOSIS — C61 Malignant neoplasm of prostate: Secondary | ICD-10-CM | POA: Diagnosis not present

## 2014-03-16 DIAGNOSIS — Z8521 Personal history of malignant neoplasm of larynx: Secondary | ICD-10-CM | POA: Diagnosis not present

## 2014-03-16 DIAGNOSIS — Z510 Encounter for antineoplastic radiation therapy: Secondary | ICD-10-CM | POA: Diagnosis not present

## 2014-03-17 ENCOUNTER — Ambulatory Visit
Admission: RE | Admit: 2014-03-17 | Discharge: 2014-03-17 | Disposition: A | Discharge status: Home / Self Care | Payer: Medicare Other | Source: Ambulatory Visit | Attending: Radiation Oncology | Admitting: Radiation Oncology

## 2014-03-17 DIAGNOSIS — C61 Malignant neoplasm of prostate: Secondary | ICD-10-CM | POA: Diagnosis not present

## 2014-03-17 DIAGNOSIS — Z8521 Personal history of malignant neoplasm of larynx: Secondary | ICD-10-CM | POA: Diagnosis not present

## 2014-03-17 DIAGNOSIS — Z510 Encounter for antineoplastic radiation therapy: Secondary | ICD-10-CM | POA: Diagnosis not present

## 2014-03-21 ENCOUNTER — Ambulatory Visit
Admission: RE | Admit: 2014-03-21 | Discharge: 2014-03-21 | Disposition: A | Discharge status: Home / Self Care | Payer: Medicare Other | Source: Ambulatory Visit | Attending: Radiation Oncology | Admitting: Radiation Oncology

## 2014-03-21 DIAGNOSIS — C61 Malignant neoplasm of prostate: Secondary | ICD-10-CM | POA: Diagnosis not present

## 2014-03-21 DIAGNOSIS — Z510 Encounter for antineoplastic radiation therapy: Secondary | ICD-10-CM | POA: Diagnosis not present

## 2014-03-21 DIAGNOSIS — Z8521 Personal history of malignant neoplasm of larynx: Secondary | ICD-10-CM | POA: Diagnosis not present

## 2014-03-22 ENCOUNTER — Ambulatory Visit
Admission: RE | Admit: 2014-03-22 | Discharge: 2014-03-22 | Disposition: A | Discharge status: Home / Self Care | Payer: Medicare Other | Source: Ambulatory Visit | Attending: Radiation Oncology | Admitting: Radiation Oncology

## 2014-03-22 ENCOUNTER — Ambulatory Visit: Admission: RE | Admit: 2014-03-22 | Payer: Medicare Other | Source: Ambulatory Visit

## 2014-03-23 ENCOUNTER — Ambulatory Visit
Admission: RE | Admit: 2014-03-23 | Discharge: 2014-03-23 | Disposition: A | Discharge status: Home / Self Care | Payer: Medicare Other | Source: Ambulatory Visit | Attending: Radiation Oncology | Admitting: Radiation Oncology

## 2014-03-23 DIAGNOSIS — Z8521 Personal history of malignant neoplasm of larynx: Secondary | ICD-10-CM | POA: Diagnosis not present

## 2014-03-23 DIAGNOSIS — Z510 Encounter for antineoplastic radiation therapy: Secondary | ICD-10-CM | POA: Diagnosis not present

## 2014-03-23 DIAGNOSIS — C61 Malignant neoplasm of prostate: Secondary | ICD-10-CM | POA: Diagnosis not present

## 2014-03-24 ENCOUNTER — Ambulatory Visit
Admission: RE | Admit: 2014-03-24 | Discharge: 2014-03-24 | Disposition: A | Discharge status: Home / Self Care | Payer: Medicare Other | Source: Ambulatory Visit | Attending: Radiation Oncology | Admitting: Radiation Oncology

## 2014-03-24 ENCOUNTER — Encounter: Payer: Self-pay | Admitting: Radiation Oncology

## 2014-03-24 VITALS — BP 120/60 | HR 89 | Temp 98.3°F | Resp 16 | Wt 204.8 lb

## 2014-03-24 DIAGNOSIS — Z510 Encounter for antineoplastic radiation therapy: Secondary | ICD-10-CM | POA: Diagnosis not present

## 2014-03-24 DIAGNOSIS — Z8521 Personal history of malignant neoplasm of larynx: Secondary | ICD-10-CM | POA: Diagnosis not present

## 2014-03-24 DIAGNOSIS — C61 Malignant neoplasm of prostate: Secondary | ICD-10-CM | POA: Diagnosis not present

## 2014-03-24 NOTE — Progress Notes (Signed)
  Radiation Oncology         [phone] ________________________________  Name: [name] MRN: [id number]  Date: 03/24/2014  DOB: [birth year]  Weekly Radiation Therapy Management  DIAGNOSIS: Stage TI C., Gleason's 7 adenocarcinoma of the prostate   Current Dose: 52.65 Gy     Planned Dose:  78 Gy  Narrative . . . . . . . . The patient presents for routine under treatment assessment.                                   The patient is without complaint.  He did have some diarrhea and took 1 Imodium dose which caused constipation for 3 days. His bowels are more regular at this time. He denies any dysuria. He has nocturia 3-4 times. He denies any rectal bleeding or proctitis symptoms.                                 Set-up films were reviewed.                                 The chart was checked. Physical Findings. . .  weight is 204 lb 12.8 oz (92.897 kg). His oral temperature is 98.3 F (36.8 C). His blood pressure is 120/60 and his pulse is 89. His respiration is 16 and oxygen saturation is 100%. . Weight essentially stable.  No significant changes. Impression . . . . . . . The patient is tolerating radiation. Plan . . . . . . . . . . . . Continue treatment as planned.  ________________________________   Blair Promise, PhD, MD

## 2014-03-24 NOTE — Progress Notes (Signed)
Weight and vitals stable. Reports increased frequency. Denies diarrhea. Denies dysuria or hematuria. Denies pain.

## 2014-03-25 ENCOUNTER — Ambulatory Visit
Admission: RE | Admit: 2014-03-25 | Discharge: 2014-03-25 | Disposition: A | Discharge status: Home / Self Care | Payer: Medicare Other | Source: Ambulatory Visit | Attending: Radiation Oncology | Admitting: Radiation Oncology

## 2014-03-25 DIAGNOSIS — C61 Malignant neoplasm of prostate: Secondary | ICD-10-CM | POA: Diagnosis not present

## 2014-03-25 DIAGNOSIS — Z8521 Personal history of malignant neoplasm of larynx: Secondary | ICD-10-CM | POA: Diagnosis not present

## 2014-03-25 DIAGNOSIS — Z510 Encounter for antineoplastic radiation therapy: Secondary | ICD-10-CM | POA: Diagnosis not present

## 2014-03-28 ENCOUNTER — Ambulatory Visit
Admission: RE | Admit: 2014-03-28 | Discharge: 2014-03-28 | Disposition: A | Discharge status: Home / Self Care | Payer: Medicare Other | Source: Ambulatory Visit | Attending: Radiation Oncology | Admitting: Radiation Oncology

## 2014-03-28 DIAGNOSIS — Z8521 Personal history of malignant neoplasm of larynx: Secondary | ICD-10-CM | POA: Diagnosis not present

## 2014-03-28 DIAGNOSIS — C61 Malignant neoplasm of prostate: Secondary | ICD-10-CM | POA: Diagnosis not present

## 2014-03-28 DIAGNOSIS — Z510 Encounter for antineoplastic radiation therapy: Secondary | ICD-10-CM | POA: Diagnosis not present

## 2014-03-29 ENCOUNTER — Encounter: Payer: Self-pay | Admitting: Radiation Oncology

## 2014-03-29 ENCOUNTER — Ambulatory Visit
Admission: RE | Admit: 2014-03-29 | Discharge: 2014-03-29 | Disposition: A | Discharge status: Home / Self Care | Payer: Medicare Other | Source: Ambulatory Visit | Attending: Radiation Oncology | Admitting: Radiation Oncology

## 2014-03-29 VITALS — BP 138/69 | HR 87 | Temp 98.1°F | Ht 69.0 in | Wt 207.0 lb

## 2014-03-29 DIAGNOSIS — C61 Malignant neoplasm of prostate: Secondary | ICD-10-CM

## 2014-03-29 DIAGNOSIS — Z8521 Personal history of malignant neoplasm of larynx: Secondary | ICD-10-CM | POA: Diagnosis not present

## 2014-03-29 DIAGNOSIS — Z510 Encounter for antineoplastic radiation therapy: Secondary | ICD-10-CM | POA: Diagnosis not present

## 2014-03-29 NOTE — Progress Notes (Signed)
  Radiation Oncology         [phone] ________________________________  Name: [name] MRN: [id number]  Date: 03/29/2014  DOB: 1930-09-18  Weekly Radiation Therapy Management  DIAGNOSIS: Stage TI C., Gleason's 7 adenocarcinoma of the prostate   Current Dose: 58.5 Gy     Planned Dose:  78 Gy  Narrative . . . . . . . . The patient presents for routine under treatment assessment.                                   The patient is without complaint. He has noticed a mild increase in his nocturia but no dysuria or hematuria. Overall he is tolerating his treatments quite well                                 Set-up films were reviewed.                                 The chart was checked. Physical Findings. . .  height is 5\' 9"  (1.753 m) and weight is 207 lb (93.895 kg). His oral temperature is 98.1 F (36.7 C). His blood pressure is 138/69 and his pulse is 87. . Weight essentially stable.  No significant changes. Impression . . . . . . . The patient is tolerating radiation. Plan . . . . . . . . . . . . Continue treatment as planned.  ________________________________   Blair Promise, PhD, MD

## 2014-03-29 NOTE — Progress Notes (Signed)
[name] has completed 30/40 fractions to his prostate.  He denies pain.  He reports getting up 4-5 times per night to urinate.  He denies hematuria, dysuria, skin irritation and diarrhea.  He reports slight fatigue.

## 2014-03-30 ENCOUNTER — Ambulatory Visit
Admission: RE | Admit: 2014-03-30 | Discharge: 2014-03-30 | Disposition: A | Discharge status: Home / Self Care | Payer: Medicare Other | Source: Ambulatory Visit | Attending: Radiation Oncology | Admitting: Radiation Oncology

## 2014-03-30 DIAGNOSIS — C61 Malignant neoplasm of prostate: Secondary | ICD-10-CM | POA: Diagnosis not present

## 2014-03-30 DIAGNOSIS — Z510 Encounter for antineoplastic radiation therapy: Secondary | ICD-10-CM | POA: Diagnosis not present

## 2014-03-30 DIAGNOSIS — Z8521 Personal history of malignant neoplasm of larynx: Secondary | ICD-10-CM | POA: Diagnosis not present

## 2014-03-31 ENCOUNTER — Ambulatory Visit
Admission: RE | Admit: 2014-03-31 | Discharge: 2014-03-31 | Disposition: A | Discharge status: Home / Self Care | Payer: Medicare Other | Source: Ambulatory Visit | Attending: Radiation Oncology | Admitting: Radiation Oncology

## 2014-03-31 DIAGNOSIS — C61 Malignant neoplasm of prostate: Secondary | ICD-10-CM | POA: Diagnosis not present

## 2014-03-31 DIAGNOSIS — Z8521 Personal history of malignant neoplasm of larynx: Secondary | ICD-10-CM | POA: Diagnosis not present

## 2014-03-31 DIAGNOSIS — Z510 Encounter for antineoplastic radiation therapy: Secondary | ICD-10-CM | POA: Diagnosis not present

## 2014-04-01 ENCOUNTER — Ambulatory Visit
Admission: RE | Admit: 2014-04-01 | Discharge: 2014-04-01 | Disposition: A | Discharge status: Home / Self Care | Payer: Medicare Other | Source: Ambulatory Visit | Attending: Radiation Oncology | Admitting: Radiation Oncology

## 2014-04-01 DIAGNOSIS — Z8521 Personal history of malignant neoplasm of larynx: Secondary | ICD-10-CM | POA: Diagnosis not present

## 2014-04-01 DIAGNOSIS — C61 Malignant neoplasm of prostate: Secondary | ICD-10-CM | POA: Diagnosis not present

## 2014-04-01 DIAGNOSIS — Z510 Encounter for antineoplastic radiation therapy: Secondary | ICD-10-CM | POA: Diagnosis not present

## 2014-04-04 ENCOUNTER — Ambulatory Visit
Admission: RE | Admit: 2014-04-04 | Discharge: 2014-04-04 | Disposition: A | Discharge status: Home / Self Care | Payer: Medicare Other | Source: Ambulatory Visit | Attending: Radiation Oncology | Admitting: Radiation Oncology

## 2014-04-04 DIAGNOSIS — Z8521 Personal history of malignant neoplasm of larynx: Secondary | ICD-10-CM | POA: Diagnosis not present

## 2014-04-04 DIAGNOSIS — C61 Malignant neoplasm of prostate: Secondary | ICD-10-CM | POA: Diagnosis not present

## 2014-04-04 DIAGNOSIS — Z510 Encounter for antineoplastic radiation therapy: Secondary | ICD-10-CM | POA: Diagnosis not present

## 2014-04-05 ENCOUNTER — Encounter: Payer: Self-pay | Admitting: Radiation Oncology

## 2014-04-05 ENCOUNTER — Ambulatory Visit
Admission: RE | Admit: 2014-04-05 | Discharge: 2014-04-05 | Disposition: A | Discharge status: Home / Self Care | Payer: Medicare Other | Source: Ambulatory Visit | Attending: Radiation Oncology | Admitting: Radiation Oncology

## 2014-04-05 VITALS — BP 155/68 | HR 88 | Temp 98.1°F | Ht 69.0 in | Wt 206.9 lb

## 2014-04-05 DIAGNOSIS — C61 Malignant neoplasm of prostate: Secondary | ICD-10-CM | POA: Diagnosis not present

## 2014-04-05 DIAGNOSIS — Z510 Encounter for antineoplastic radiation therapy: Secondary | ICD-10-CM | POA: Diagnosis not present

## 2014-04-05 DIAGNOSIS — Z8521 Personal history of malignant neoplasm of larynx: Secondary | ICD-10-CM | POA: Diagnosis not present

## 2014-04-05 NOTE — Progress Notes (Signed)
[name] has completed 35 fractions to his prostate.  He denies pain, diarrhea, trouble emptying his bladder, skin irritation and an increase in urinary frequency.  He reports continuing to get up 4-5 times per night to urinate.  He reports feeling slightly fatigued.

## 2014-04-05 NOTE — Progress Notes (Signed)
  Radiation Oncology         [phone] ________________________________  Name: [name] MRN: [id number]  Date: 04/05/2014  DOB: 01-17-1931  Weekly Radiation Therapy Management  DIAGNOSIS: Stage TI C., Gleason's 7 adenocarcinoma of the prostate    Current Dose: 68.25 Gy     Planned Dose:  78 Gy  Narrative . . . . . . . . The patient presents for routine under treatment assessment.                                   The patient is without complaint except for fatigue                                 Set-up films were reviewed.                                 The chart was checked. Physical Findings. . .  height is 5\' 9"  (1.753 m) and weight is 206 lb 14.4 oz (93.849 kg). His oral temperature is 98.1 F (36.7 C). His blood pressure is 155/68 and his pulse is 88. . Weight essentially stable.  The lungs are clear. The heart has a regular rhythm and rate. The abdomen is soft and nontender with normal bowel sounds.  Impression . . . . . . . The patient is tolerating radiation. Plan . . . . . . . . . . . . Continue treatment as planned.  ________________________________   Blair Promise, PhD, MD

## 2014-04-06 ENCOUNTER — Ambulatory Visit
Admission: RE | Admit: 2014-04-06 | Discharge: 2014-04-06 | Disposition: A | Discharge status: Home / Self Care | Payer: Medicare Other | Source: Ambulatory Visit | Attending: Radiation Oncology | Admitting: Radiation Oncology

## 2014-04-06 DIAGNOSIS — Z510 Encounter for antineoplastic radiation therapy: Secondary | ICD-10-CM | POA: Diagnosis not present

## 2014-04-06 DIAGNOSIS — C61 Malignant neoplasm of prostate: Secondary | ICD-10-CM | POA: Diagnosis not present

## 2014-04-06 DIAGNOSIS — Z8521 Personal history of malignant neoplasm of larynx: Secondary | ICD-10-CM | POA: Diagnosis not present

## 2014-04-07 ENCOUNTER — Ambulatory Visit
Admission: RE | Admit: 2014-04-07 | Discharge: 2014-04-07 | Disposition: A | Discharge status: Home / Self Care | Payer: Medicare Other | Source: Ambulatory Visit | Attending: Radiation Oncology | Admitting: Radiation Oncology

## 2014-04-07 DIAGNOSIS — Z8521 Personal history of malignant neoplasm of larynx: Secondary | ICD-10-CM | POA: Diagnosis not present

## 2014-04-07 DIAGNOSIS — C61 Malignant neoplasm of prostate: Secondary | ICD-10-CM | POA: Diagnosis not present

## 2014-04-07 DIAGNOSIS — Z510 Encounter for antineoplastic radiation therapy: Secondary | ICD-10-CM | POA: Diagnosis not present

## 2014-04-08 ENCOUNTER — Ambulatory Visit
Admission: RE | Admit: 2014-04-08 | Discharge: 2014-04-08 | Disposition: A | Discharge status: Home / Self Care | Payer: Medicare Other | Source: Ambulatory Visit | Attending: Radiation Oncology | Admitting: Radiation Oncology

## 2014-04-08 DIAGNOSIS — C61 Malignant neoplasm of prostate: Secondary | ICD-10-CM | POA: Diagnosis not present

## 2014-04-08 DIAGNOSIS — Z8521 Personal history of malignant neoplasm of larynx: Secondary | ICD-10-CM | POA: Diagnosis not present

## 2014-04-08 DIAGNOSIS — Z510 Encounter for antineoplastic radiation therapy: Secondary | ICD-10-CM | POA: Diagnosis not present

## 2014-04-11 ENCOUNTER — Ambulatory Visit
Admission: RE | Admit: 2014-04-11 | Discharge: 2014-04-11 | Disposition: A | Discharge status: Home / Self Care | Payer: Medicare Other | Source: Ambulatory Visit | Attending: Radiation Oncology | Admitting: Radiation Oncology

## 2014-04-11 ENCOUNTER — Ambulatory Visit: Payer: Medicare Other

## 2014-04-11 DIAGNOSIS — C61 Malignant neoplasm of prostate: Secondary | ICD-10-CM | POA: Diagnosis not present

## 2014-04-11 DIAGNOSIS — Z8521 Personal history of malignant neoplasm of larynx: Secondary | ICD-10-CM | POA: Diagnosis not present

## 2014-04-11 DIAGNOSIS — Z510 Encounter for antineoplastic radiation therapy: Secondary | ICD-10-CM | POA: Diagnosis not present

## 2014-04-12 ENCOUNTER — Encounter: Payer: Self-pay | Admitting: Radiation Oncology

## 2014-04-12 ENCOUNTER — Ambulatory Visit
Admission: RE | Admit: 2014-04-12 | Discharge: 2014-04-12 | Disposition: A | Discharge status: Home / Self Care | Payer: Medicare Other | Source: Ambulatory Visit | Attending: Radiation Oncology | Admitting: Radiation Oncology

## 2014-04-12 ENCOUNTER — Ambulatory Visit: Payer: Medicare Other

## 2014-04-12 VITALS — BP 119/66 | HR 90 | Temp 97.8°F | Ht 69.0 in | Wt 205.5 lb

## 2014-04-12 DIAGNOSIS — C61 Malignant neoplasm of prostate: Secondary | ICD-10-CM | POA: Diagnosis not present

## 2014-04-12 DIAGNOSIS — Z8521 Personal history of malignant neoplasm of larynx: Secondary | ICD-10-CM | POA: Diagnosis not present

## 2014-04-12 DIAGNOSIS — Z510 Encounter for antineoplastic radiation therapy: Secondary | ICD-10-CM | POA: Diagnosis not present

## 2014-04-12 NOTE — Progress Notes (Signed)
[name] has completed treatment with 40 fractions to his prostate.  He denies pain, hematuria, dysuria and diarrhea.  He reports that he occasional does not feel like his bladder empties completely.  He has not noticed an increase in urinary frequency.  He gets up 4-5 times per night to urinate.  He reports he has felt fatigued.  He has been given a one month follow up card.

## 2014-04-12 NOTE — Progress Notes (Signed)
  Radiation Oncology         [phone] ________________________________  Name: [name] MRN: [id number]  Date: 04/12/2014  DOB: 1931-04-07  Weekly Radiation Therapy Management  DIAGNOSIS: Stage TI C., Gleason's 7 adenocarcinoma of the prostate    Current Dose: 78 Gy     Planned Dose:  78 Gy  Narrative . . . . . . . . The patient presents for routine under treatment assessment.                                   The patient is without complaint. He is happy to complete his radiation therapy. He has not noticed any significant change in his symptoms                                 Set-up films were reviewed.                                 The chart was checked. Physical Findings. . .  height is 5\' 9"  (1.753 m) and weight is 205 lb 8 oz (93.214 kg). His oral temperature is 97.8 F (36.6 C). His blood pressure is 119/66 and his pulse is 90. . Weight essentially stable.  No significant changes. Impression . . . . . . . The patient is tolerated radiation. Plan . . . . . . . . . . Marland Kitchen one month followup  ________________________________   Blair Promise, PhD, MD

## 2014-04-13 ENCOUNTER — Ambulatory Visit: Payer: Medicare Other

## 2014-04-19 DIAGNOSIS — R490 Dysphonia: Secondary | ICD-10-CM | POA: Diagnosis not present

## 2014-04-24 ENCOUNTER — Encounter: Payer: Self-pay | Admitting: Radiation Oncology

## 2014-04-24 NOTE — Progress Notes (Signed)
  Radiation Oncology         [phone] ________________________________  Name: [name] MRN: [id number]  Date: 04/24/2014  DOB: [birth year]  End of Treatment Note  Diagnosis:     Stage TI C., Gleason's 7 adenocarcinoma of the prostate    Indication for treatment:  Definitive treatment       Radiation treatment dates:   June 1 through July 28  Site/dose:   Prostate 78 gray in 40 fractions  Beams/energy:   Intensity modulated radiation therapy, rapid arc, daily image guidance  Narrative: The patient tolerated radiation treatment relatively well.   He noticed some mild nocturia and mild fatigue.  Plan: The patient has completed radiation treatment. The patient will return to radiation oncology clinic for routine followup in one month. I advised them to call or return sooner if they have any questions or concerns related to their recovery or treatment.  -----------------------------------  Blair Promise, PhD, MD

## 2014-05-30 ENCOUNTER — Encounter: Payer: Self-pay | Admitting: Oncology

## 2014-06-02 ENCOUNTER — Ambulatory Visit
Admission: RE | Admit: 2014-06-02 | Discharge: 2014-06-02 | Disposition: A | Discharge status: Home / Self Care | Payer: Medicare Other | Source: Ambulatory Visit | Attending: Radiation Oncology | Admitting: Radiation Oncology

## 2014-06-02 ENCOUNTER — Encounter: Payer: Self-pay | Admitting: Radiation Oncology

## 2014-06-02 VITALS — BP 150/72 | HR 73 | Temp 98.2°F | Resp 20 | Ht 69.0 in | Wt 205.4 lb

## 2014-06-02 DIAGNOSIS — C61 Malignant neoplasm of prostate: Secondary | ICD-10-CM

## 2014-06-02 NOTE — Progress Notes (Signed)
  Radiation Oncology         [phone] ________________________________  Name: [name] MRN: [id number]  Date: 06/02/2014  DOB: [birth year]  Follow-Up Visit Note  CC: Cathlean Cower, MD  Biagio Borg, MD  Diagnosis: Stage TI C., Gleason's 7 adenocarcinoma of the prostate    Interval Since Last Radiation:  6  weeks  Narrative:  The patient returns today for routine follow-up.  He is doing well this time. He denies any hematuria dysuria or difficulty with his urinary stream. He also denies a problems with diarrhea. He occasionally will have some episodes of constipation controlled well with a laxative. Patient did have a PSA drawn at the [facility] which was down to 2.69 pre-treatment PSA was up to ~15.                             ALLERGIES:  is allergic to terazosin.  Meds: Current Outpatient Prescriptions  Medication Sig Dispense Refill  . aspirin 81 MG tablet Take 81 mg by mouth daily.      Marland Kitchen lisinopril (PRINIVIL,ZESTRIL) 40 MG tablet Take 20 mg by mouth daily. 1/2 by mouth once daily      . Multiple Vitamin (MULTIVITAMIN) tablet Take 1 tablet by mouth daily.      . pantoprazole (PROTONIX) 40 MG tablet Take 40 mg by mouth 2 (two) times daily.      . ranitidine (ZANTAC) 150 MG capsule Take 150 mg by mouth every evening.       . Saw Palmetto 1000 MG CAPS Take 1,000 mg by mouth daily. 5 by mouth daily      . zolpidem (AMBIEN) 10 MG tablet Take 10 mg by mouth at bedtime as needed for sleep.        No current facility-administered medications for this encounter.    Physical Findings: The patient is in no acute distress. Patient is alert and oriented.  height is 5\' 9"  (1.753 m) and weight is 205 lb 6.4 oz (93.169 kg). His oral temperature is 98.2 F (36.8 C). His blood pressure is 150/72 and his pulse is 73. His respiration is 20. .  The lungs are clear. The heart has a regular rhythm and rate. The abdomen is soft and nontender with normal bowel sounds.  Lab Findings: Lab  Results  Component Value Date   WBC 16.7* 01/08/2014   HGB 14.3 01/08/2014   HCT 42.2 01/08/2014   MCV 86.5 01/08/2014   PLT 179 01/08/2014       Radiographic Findings: No results found.  Impression:  The patient is recovering from the effects of radiation. Favorable PSA posttreatment. I would expect this to continue to decline.  Plan:  Followup with urology later this fall. Routine followup in radiation oncology in 6 months.  ____________________________________ Blair Promise, MD

## 2014-06-02 NOTE — Progress Notes (Signed)
[name] here for follow up after treatment for prostate cancer.  He denies pain, dysuria, hematuria, urinary frequency and diarrhea.  He reports constipation and takes a laxative once a week as needed.  He reports his fatigue is improving although he is not sleeping well.  He takes Ambien once a week.

## 2014-06-15 ENCOUNTER — Ambulatory Visit (INDEPENDENT_AMBULATORY_CARE_PROVIDER_SITE_OTHER): Payer: Medicare Other | Admitting: *Deleted

## 2014-06-15 DIAGNOSIS — Z23 Encounter for immunization: Secondary | ICD-10-CM | POA: Diagnosis not present

## 2014-06-16 DIAGNOSIS — H31012 Macula scars of posterior pole (postinflammatory) (post-traumatic), left eye: Secondary | ICD-10-CM | POA: Diagnosis not present

## 2014-06-16 DIAGNOSIS — H2513 Age-related nuclear cataract, bilateral: Secondary | ICD-10-CM | POA: Diagnosis not present

## 2014-06-20 DIAGNOSIS — Z87891 Personal history of nicotine dependence: Secondary | ICD-10-CM | POA: Diagnosis not present

## 2014-06-20 DIAGNOSIS — J310 Chronic rhinitis: Secondary | ICD-10-CM | POA: Diagnosis not present

## 2014-06-29 DIAGNOSIS — Z438 Encounter for attention to other artificial openings: Secondary | ICD-10-CM | POA: Diagnosis not present

## 2014-06-29 DIAGNOSIS — Z8521 Personal history of malignant neoplasm of larynx: Secondary | ICD-10-CM | POA: Diagnosis not present

## 2014-07-06 DIAGNOSIS — C61 Malignant neoplasm of prostate: Secondary | ICD-10-CM | POA: Diagnosis not present

## 2014-07-13 DIAGNOSIS — C61 Malignant neoplasm of prostate: Secondary | ICD-10-CM | POA: Diagnosis not present

## 2014-07-13 DIAGNOSIS — R351 Nocturia: Secondary | ICD-10-CM | POA: Diagnosis not present

## 2014-07-13 DIAGNOSIS — N403 Nodular prostate with lower urinary tract symptoms: Secondary | ICD-10-CM | POA: Diagnosis not present

## 2014-07-14 DIAGNOSIS — M76892 Other specified enthesopathies of left lower limb, excluding foot: Secondary | ICD-10-CM | POA: Diagnosis not present

## 2014-07-14 DIAGNOSIS — M10072 Idiopathic gout, left ankle and foot: Secondary | ICD-10-CM | POA: Diagnosis not present

## 2014-07-14 DIAGNOSIS — M79672 Pain in left foot: Secondary | ICD-10-CM | POA: Diagnosis not present

## 2014-07-18 DIAGNOSIS — M10072 Idiopathic gout, left ankle and foot: Secondary | ICD-10-CM | POA: Diagnosis not present

## 2014-07-25 DIAGNOSIS — M10072 Idiopathic gout, left ankle and foot: Secondary | ICD-10-CM | POA: Diagnosis not present

## 2014-10-20 DIAGNOSIS — R490 Dysphonia: Secondary | ICD-10-CM | POA: Diagnosis not present

## 2014-11-11 DIAGNOSIS — R490 Dysphonia: Secondary | ICD-10-CM | POA: Diagnosis not present

## 2014-11-17 ENCOUNTER — Encounter: Payer: Self-pay | Admitting: Radiation Oncology

## 2014-11-17 ENCOUNTER — Ambulatory Visit
Admission: RE | Admit: 2014-11-17 | Discharge: 2014-11-17 | Disposition: A | Discharge status: Home / Self Care | Payer: Medicare Other | Source: Ambulatory Visit | Attending: Radiation Oncology | Admitting: Radiation Oncology

## 2014-11-17 VITALS — BP 147/68 | HR 78 | Temp 98.1°F | Resp 20 | Ht 69.0 in | Wt 209.6 lb

## 2014-11-17 DIAGNOSIS — C61 Malignant neoplasm of prostate: Secondary | ICD-10-CM

## 2014-11-17 NOTE — Progress Notes (Signed)
  Radiation Oncology         [phone] ________________________________  Name: [name] MRN: [id number]  Date: 11/17/2014  DOB: Jul 07, 1931  Follow-Up Visit Note  CC: Cathlean Cower, MD  Malka So, MD    ICD-9-CM ICD-10-CM   1. Malignant neoplasm of prostate 185 C61     Diagnosis:   Stage TI C., Gleason's 7 adenocarcinoma of the prostate  Interval Since Last Radiation:  7  months  Narrative:  The patient returns today for routine follow-up.  He is doing well at this time. He has nocturia 2 which is actually better than prior to his radiation therapy. He denies any dysuria or hematuria. This denies any rectal bleeding or change in bowel habits. He did have a PSA 07/13/2014 at the [facility] which was down to 2.93. The patient will meet with [name] in May and will have a PSA prior to this visit.                              ALLERGIES:  is allergic to terazosin.  Meds: Current Outpatient Prescriptions  Medication Sig Dispense Refill  . aspirin 81 MG tablet Take 81 mg by mouth daily.    Marland Kitchen lisinopril (PRINIVIL,ZESTRIL) 40 MG tablet Take 20 mg by mouth daily. 1/2 by mouth once daily    . Multiple Vitamin (MULTIVITAMIN) tablet Take 1 tablet by mouth daily.    . pantoprazole (PROTONIX) 40 MG tablet Take 40 mg by mouth 2 (two) times daily.    . ranitidine (ZANTAC) 150 MG capsule Take 150 mg by mouth every evening.     . Saw Palmetto 1000 MG CAPS Take 1,000 mg by mouth daily. 5 by mouth daily    . zolpidem (AMBIEN) 10 MG tablet Take 10 mg by mouth at bedtime as needed for sleep.      No current facility-administered medications for this encounter.    Physical Findings: The patient is in no acute distress. Patient is alert and oriented.  height is 5\' 9"  (1.753 m) and weight is 209 lb 9.6 oz (95.074 kg). His oral temperature is 98.1 F (36.7 C). His blood pressure is 147/68 and his pulse is 78. His respiration is 20. Marland Kitchen  No palpable subclavicular or axillary adenopathy. Lungs  are clear to auscultation. The heart has a regular rhythm and rate. The abdomen is soft and nontender with normal bowel sounds.  Lab Findings: Lab Results  Component Value Date   WBC 16.7* 01/08/2014   HGB 14.3 01/08/2014   HCT 42.2 01/08/2014   MCV 86.5 01/08/2014   PLT 179 01/08/2014    Radiographic Findings: No results found.  Impression:  The patient is doing well this time, no appreciable side effects from his radiation treatment. Favorable response to treatment thus far with a decrease in PSA.  Plan:  When necessary follow-up in radiation oncology. The patient will continue close follow-up with urology.  ____________________________________ Blair Promise, MD

## 2014-11-17 NOTE — Progress Notes (Signed)
Follow up s/p radiation to the prostate   02/14/14-04/12/14 , regular bowel movements, no hematuria,no dysuria, normal steram, nocturia x2, no frequency or urgency stated, appetite, good, mild fatigue, last PSA 07/13/14= 2.93, , next appt with .    Lab April 28,2016, and May 5,2016 appt [name] 10:10 AM

## 2014-11-24 ENCOUNTER — Encounter (HOSPITAL_BASED_OUTPATIENT_CLINIC_OR_DEPARTMENT_OTHER): Payer: Self-pay | Admitting: *Deleted

## 2014-11-24 ENCOUNTER — Encounter (HOSPITAL_BASED_OUTPATIENT_CLINIC_OR_DEPARTMENT_OTHER)
Admission: RE | Admit: 2014-11-24 | Discharge: 2014-11-24 | Disposition: A | Discharge status: Home / Self Care | Payer: Medicare Other | Source: Ambulatory Visit | Attending: Otolaryngology | Admitting: Otolaryngology

## 2014-11-24 DIAGNOSIS — Z01810 Encounter for preprocedural cardiovascular examination: Secondary | ICD-10-CM | POA: Diagnosis not present

## 2014-11-24 DIAGNOSIS — Y833 Surgical operation with formation of external stoma as the cause of abnormal reaction of the patient, or of later complication, without mention of misadventure at the time of the procedure: Secondary | ICD-10-CM | POA: Diagnosis not present

## 2014-11-24 DIAGNOSIS — K219 Gastro-esophageal reflux disease without esophagitis: Secondary | ICD-10-CM | POA: Diagnosis not present

## 2014-11-24 DIAGNOSIS — T8589XA Other specified complication of internal prosthetic devices, implants and grafts, not elsewhere classified, initial encounter: Secondary | ICD-10-CM | POA: Diagnosis not present

## 2014-11-24 DIAGNOSIS — J9503 Malfunction of tracheostomy stoma: Secondary | ICD-10-CM | POA: Diagnosis not present

## 2014-11-24 DIAGNOSIS — Z9002 Acquired absence of larynx: Secondary | ICD-10-CM | POA: Diagnosis not present

## 2014-11-24 DIAGNOSIS — Z85828 Personal history of other malignant neoplasm of skin: Secondary | ICD-10-CM | POA: Diagnosis not present

## 2014-11-24 DIAGNOSIS — Z9889 Other specified postprocedural states: Secondary | ICD-10-CM | POA: Diagnosis not present

## 2014-11-24 DIAGNOSIS — K222 Esophageal obstruction: Secondary | ICD-10-CM | POA: Diagnosis not present

## 2014-11-24 DIAGNOSIS — I10 Essential (primary) hypertension: Secondary | ICD-10-CM | POA: Diagnosis not present

## 2014-11-24 DIAGNOSIS — Z8546 Personal history of malignant neoplasm of prostate: Secondary | ICD-10-CM | POA: Diagnosis not present

## 2014-11-24 DIAGNOSIS — Y929 Unspecified place or not applicable: Secondary | ICD-10-CM | POA: Diagnosis not present

## 2014-11-24 DIAGNOSIS — Z923 Personal history of irradiation: Secondary | ICD-10-CM | POA: Diagnosis not present

## 2014-11-24 DIAGNOSIS — Z87891 Personal history of nicotine dependence: Secondary | ICD-10-CM | POA: Diagnosis not present

## 2014-11-24 DIAGNOSIS — N400 Enlarged prostate without lower urinary tract symptoms: Secondary | ICD-10-CM | POA: Diagnosis not present

## 2014-11-24 DIAGNOSIS — Z8521 Personal history of malignant neoplasm of larynx: Secondary | ICD-10-CM | POA: Diagnosis not present

## 2014-11-24 DIAGNOSIS — Z7982 Long term (current) use of aspirin: Secondary | ICD-10-CM | POA: Diagnosis not present

## 2014-11-24 LAB — BASIC METABOLIC PANEL
Anion gap: 6 (ref 5–15)
BUN: 12 mg/dL (ref 6–23)
CO2: 28 mmol/L (ref 19–32)
Calcium: 9.2 mg/dL (ref 8.4–10.5)
Chloride: 107 mmol/L (ref 96–112)
Creatinine, Ser: 1.16 mg/dL (ref 0.50–1.35)
GFR calc Af Amer: 66 mL/min — ABNORMAL LOW (ref >90)
GFR calc non Af Amer: 57 mL/min — ABNORMAL LOW (ref >90)
Glucose, Bld: 99 mg/dL (ref 70–99)
Potassium: 4.1 mmol/L (ref 3.5–5.1)
Sodium: 141 mmol/L (ref 135–145)

## 2014-11-24 NOTE — Progress Notes (Signed)
Pt has been here-to come in for ekg-bmet

## 2014-11-28 ENCOUNTER — Other Ambulatory Visit: Payer: Self-pay | Admitting: Otolaryngology

## 2014-11-28 NOTE — H&P (Signed)
PREOPERATIVE H&P  Chief Complaint: trouble with voice prosthesis  HPI: [name] is a 81 y.o. male who presents for evaluation of voice prosthesis. He is 8 years s/p total laryngectomy and 7 years s/p placement of the voice prosthesis. On exam he has granulation tissue growing over the prosthesis. He's taken to the OR for laser excision of the granulation tissue.  Past Medical History  Diagnosis Date  . Laryngeal cancer      Dr Gwenlyn Saran  . GERD (gastroesophageal reflux disease)   . Adenocarcinoma of prostate, stage 1   . HYPERLIPIDEMIA   . ANXIETY   . HYPERTENSION   . ALLERGIC RHINITIS   . GERD   . DIVERTICULOSIS, COLON 07/06/2003  . BENIGN PROSTATIC HYPERTROPHY   . History of prostatitis   . Impaired glucose tolerance   . UTI (lower urinary tract infection) 2005  . Diverticulosis 07/06/2003  . Third nerve palsy 2005    painful, transient  . Esophageal stricture   . Squamous cell carcinoma   . Full dentures   . Wears glasses   . Radiation 02/14/14-04/12/14    prostate 41 gray   Past Surgical History  Procedure Laterality Date  . Lipoma excision      abd wall  . Tonsillectomy    . Total laryngectomy    . Esophageal dilation  01/10/2012    Dr. Lucia Gaskins  . Neck surgery  01/10/2012    tumor removed from back of neck  . Mass excision  01/10/2012    Procedure: EXCISION MASS;  Surgeon: Rozetta Nunnery, MD;  Location: [place];  Service: ENT;  Laterality: N/A;  posterior neck mass  . Tracheostomy revision N/A 10/12/2013    Procedure: TRACHEOSTOMA  REVSION w/ CO2 LASER;  Surgeon: Rozetta Nunnery, MD;  Location: [place];  Service: ENT;  Laterality: N/A;  . Esophageal dilation N/A 10/12/2013    Procedure: ESOPHAGEAL DILATION;  Surgeon: Rozetta Nunnery, MD;  Location: [place];  Service: ENT;  Laterality: N/A;  . Prostate biopsy  04/29/2005   History   Social History  . Marital Status: Married    Spouse Name: N/A   . Number of Children: 1  . Years of Education: N/A   Occupational History  . Retired    Social History Main Topics  . Smoking status: Former Smoker -- 1.00 packs/day for 25 years    Types: Cigarettes    Quit date: 01/07/1987  . Smokeless tobacco: Never Used  . Alcohol Use: No  . Drug Use: No  . Sexual Activity: Not on file   Other Topics Concern  . None   Social History Narrative   Daily caffeine    Family History  Problem Relation Age of Onset  . Hypertension Father   . Diabetes Brother   . Diabetes Brother   . Heart attack Father 31  . Colon cancer Neg Hx    Allergies  Allergen Reactions  . Terazosin Anaphylaxis    Patient said it made him dizzy   Prior to Admission medications   Medication Sig Start Date End Date Taking? Authorizing Provider  aspirin 81 MG tablet Take 81 mg by mouth daily.    Historical Provider, MD  lisinopril (PRINIVIL,ZESTRIL) 40 MG tablet Take 20 mg by mouth daily. 1/2 by mouth once daily    Historical Provider, MD  Multiple Vitamin (MULTIVITAMIN) tablet Take 1 tablet by mouth daily.    Historical Provider, MD  pantoprazole (PROTONIX) 40  MG tablet Take 40 mg by mouth 2 (two) times daily.    Historical Provider, MD  ranitidine (ZANTAC) 150 MG capsule Take 150 mg by mouth every evening.     Historical Provider, MD  Saw Palmetto 1000 MG CAPS Take 1,000 mg by mouth daily. 5 by mouth daily    Historical Provider, MD  zolpidem (AMBIEN) 10 MG tablet Take 10 mg by mouth at bedtime as needed for sleep.     Historical Provider, MD     Positive ROS: difficulty using the voice prosthesis  All other systems have been reviewed and were otherwise negative with the exception of those mentioned in the HPI and as above.  Physical Exam: There were no vitals filed for this visit.  General: Alert, no acute distress Oral: Normal oral mucosa and tonsils Nasal: Clear nasal passages Neck: No palpable adenopathy or thyroid nodules. Small laryngeal stoma [employer]  voice prosthesis with excessive granulation tissue Ear: Ear canal is clear with normal appearing TMs Cardiovascular: Regular rate and rhythm, no murmur.  Respiratory: Clear to auscultation Neurologic: Alert and oriented x 3   Assessment/Plan: TRACHEAL STENOSIS Plan for Procedure(s): TRACHEOSTOMY REVISION CO2 LASER APPLICATION   Melony Overly, MD 11/28/2014 3:56 PM

## 2014-11-29 ENCOUNTER — Encounter (HOSPITAL_BASED_OUTPATIENT_CLINIC_OR_DEPARTMENT_OTHER)
Admission: RE | Disposition: A | Discharge status: Home / Self Care | Payer: Self-pay | Source: Ambulatory Visit | Attending: Otolaryngology

## 2014-11-29 ENCOUNTER — Ambulatory Visit (HOSPITAL_BASED_OUTPATIENT_CLINIC_OR_DEPARTMENT_OTHER): Payer: Medicare Other | Admitting: Anesthesiology

## 2014-11-29 ENCOUNTER — Ambulatory Visit (HOSPITAL_BASED_OUTPATIENT_CLINIC_OR_DEPARTMENT_OTHER)
Admission: RE | Admit: 2014-11-29 | Discharge: 2014-11-29 | Disposition: A | Discharge status: Home / Self Care | Payer: Medicare Other | Source: Ambulatory Visit | Attending: Otolaryngology | Admitting: Otolaryngology

## 2014-11-29 ENCOUNTER — Encounter (HOSPITAL_BASED_OUTPATIENT_CLINIC_OR_DEPARTMENT_OTHER): Payer: Self-pay

## 2014-11-29 DIAGNOSIS — Y929 Unspecified place or not applicable: Secondary | ICD-10-CM | POA: Insufficient documentation

## 2014-11-29 DIAGNOSIS — K219 Gastro-esophageal reflux disease without esophagitis: Secondary | ICD-10-CM | POA: Diagnosis not present

## 2014-11-29 DIAGNOSIS — Z85828 Personal history of other malignant neoplasm of skin: Secondary | ICD-10-CM | POA: Insufficient documentation

## 2014-11-29 DIAGNOSIS — Z9002 Acquired absence of larynx: Secondary | ICD-10-CM | POA: Insufficient documentation

## 2014-11-29 DIAGNOSIS — N400 Enlarged prostate without lower urinary tract symptoms: Secondary | ICD-10-CM | POA: Diagnosis not present

## 2014-11-29 DIAGNOSIS — Y833 Surgical operation with formation of external stoma as the cause of abnormal reaction of the patient, or of later complication, without mention of misadventure at the time of the procedure: Secondary | ICD-10-CM | POA: Insufficient documentation

## 2014-11-29 DIAGNOSIS — R490 Dysphonia: Secondary | ICD-10-CM | POA: Diagnosis not present

## 2014-11-29 DIAGNOSIS — Z923 Personal history of irradiation: Secondary | ICD-10-CM | POA: Insufficient documentation

## 2014-11-29 DIAGNOSIS — I10 Essential (primary) hypertension: Secondary | ICD-10-CM | POA: Insufficient documentation

## 2014-11-29 DIAGNOSIS — Z9889 Other specified postprocedural states: Secondary | ICD-10-CM | POA: Insufficient documentation

## 2014-11-29 DIAGNOSIS — J9503 Malfunction of tracheostomy stoma: Secondary | ICD-10-CM | POA: Diagnosis not present

## 2014-11-29 DIAGNOSIS — T8589XA Other specified complication of internal prosthetic devices, implants and grafts, not elsewhere classified, initial encounter: Secondary | ICD-10-CM | POA: Insufficient documentation

## 2014-11-29 DIAGNOSIS — K222 Esophageal obstruction: Secondary | ICD-10-CM | POA: Diagnosis not present

## 2014-11-29 DIAGNOSIS — Z8521 Personal history of malignant neoplasm of larynx: Secondary | ICD-10-CM | POA: Insufficient documentation

## 2014-11-29 DIAGNOSIS — Z87891 Personal history of nicotine dependence: Secondary | ICD-10-CM | POA: Insufficient documentation

## 2014-11-29 DIAGNOSIS — Z7982 Long term (current) use of aspirin: Secondary | ICD-10-CM | POA: Insufficient documentation

## 2014-11-29 DIAGNOSIS — Z8546 Personal history of malignant neoplasm of prostate: Secondary | ICD-10-CM | POA: Insufficient documentation

## 2014-11-29 HISTORY — PX: ESOPHAGOSCOPY WITH DILITATION: SHX5618

## 2014-11-29 HISTORY — PX: TRACHEOSTOMY REVISION: SHX6133

## 2014-11-29 HISTORY — PX: CO2 LASER APPLICATION: SHX5778

## 2014-11-29 LAB — POCT HEMOGLOBIN-HEMACUE: HEMOGLOBIN: 15.3 g/dL (ref 13.0–17.0)

## 2014-11-29 SURGERY — REVISION, STOMA, TRACHEA
Anesthesia: General

## 2014-11-29 MED ORDER — OXYCODONE HCL 5 MG/5ML PO SOLN: 5.0000 mg | Freq: Once | ORAL | Status: DC | PRN

## 2014-11-29 MED ORDER — EPHEDRINE SULFATE 50 MG/ML IJ SOLN
INTRAMUSCULAR | Status: DC | PRN
  Administered 2014-11-29: 15 mg via INTRAVENOUS

## 2014-11-29 MED ORDER — OXYCODONE HCL 5 MG PO TABS: 5.0000 mg | ORAL_TABLET | Freq: Once | ORAL | Status: DC | PRN

## 2014-11-29 MED ORDER — METHYLENE BLUE 1 % INJ SOLN
INTRAMUSCULAR | Status: AC
  Filled 2014-11-29: qty 10

## 2014-11-29 MED ORDER — SUCCINYLCHOLINE CHLORIDE 20 MG/ML IJ SOLN
INTRAMUSCULAR | Status: AC
  Filled 2014-11-29: qty 1

## 2014-11-29 MED ORDER — ONDANSETRON HCL 4 MG/2ML IJ SOLN: 4.0000 mg | Freq: Four times a day (QID) | INTRAMUSCULAR | Status: DC | PRN

## 2014-11-29 MED ORDER — MIDAZOLAM HCL 2 MG/2ML IJ SOLN: 1.0000 mg | INTRAMUSCULAR | Status: DC | PRN

## 2014-11-29 MED ORDER — BACITRACIN ZINC 500 UNIT/GM EX OINT
TOPICAL_OINTMENT | CUTANEOUS | Status: AC
  Filled 2014-11-29: qty 0.9

## 2014-11-29 MED ORDER — FENTANYL CITRATE 0.05 MG/ML IJ SOLN
INTRAMUSCULAR | Status: DC | PRN
  Administered 2014-11-29 (×2): 50 ug via INTRAVENOUS

## 2014-11-29 MED ORDER — PHENYLEPHRINE HCL 10 MG/ML IJ SOLN
INTRAMUSCULAR | Status: DC | PRN
  Administered 2014-11-29: 40 ug via INTRAVENOUS

## 2014-11-29 MED ORDER — FENTANYL CITRATE 0.05 MG/ML IJ SOLN: 25.0000 ug | INTRAMUSCULAR | Status: DC | PRN

## 2014-11-29 MED ORDER — DEXTROSE 5 % IV SOLN
10.0000 mg | INTRAVENOUS | Status: DC | PRN
  Administered 2014-11-29: 40 ug/min via INTRAVENOUS

## 2014-11-29 MED ORDER — DEXAMETHASONE SODIUM PHOSPHATE 4 MG/ML IJ SOLN
INTRAMUSCULAR | Status: DC | PRN
  Administered 2014-11-29: 10 mg via INTRAVENOUS

## 2014-11-29 MED ORDER — LIDOCAINE-EPINEPHRINE 1 %-1:100000 IJ SOLN
INTRAMUSCULAR | Status: AC
  Filled 2014-11-29: qty 1

## 2014-11-29 MED ORDER — FENTANYL CITRATE 0.05 MG/ML IJ SOLN
INTRAMUSCULAR | Status: AC
  Filled 2014-11-29: qty 4

## 2014-11-29 MED ORDER — EPINEPHRINE HCL 1 MG/ML IJ SOLN
INTRAMUSCULAR | Status: DC | PRN
  Administered 2014-11-29 (×2): 1 mg

## 2014-11-29 MED ORDER — FENTANYL CITRATE 0.05 MG/ML IJ SOLN: 50.0000 ug | INTRAMUSCULAR | Status: DC | PRN

## 2014-11-29 MED ORDER — LIDOCAINE HCL (CARDIAC) 20 MG/ML IV SOLN
INTRAVENOUS | Status: DC | PRN
  Administered 2014-11-29: 50 mg via INTRAVENOUS

## 2014-11-29 MED ORDER — CEFAZOLIN SODIUM-DEXTROSE 2-3 GM-% IV SOLR
INTRAVENOUS | Status: AC
  Filled 2014-11-29: qty 50

## 2014-11-29 MED ORDER — LACTATED RINGERS IV SOLN
INTRAVENOUS | Status: DC
  Administered 2014-11-29: 07:00:00 via INTRAVENOUS

## 2014-11-29 MED ORDER — EPINEPHRINE HCL 1 MG/ML IJ SOLN
INTRAMUSCULAR | Status: AC
  Filled 2014-11-29: qty 1

## 2014-11-29 MED ORDER — ONDANSETRON HCL 4 MG/2ML IJ SOLN
INTRAMUSCULAR | Status: DC | PRN
  Administered 2014-11-29: 4 mg via INTRAVENOUS

## 2014-11-29 MED ORDER — CEFAZOLIN SODIUM-DEXTROSE 2-3 GM-% IV SOLR
2.0000 g | INTRAVENOUS | Status: AC
  Administered 2014-11-29: 2 g via INTRAVENOUS

## 2014-11-29 MED ORDER — PROPOFOL 10 MG/ML IV BOLUS
INTRAVENOUS | Status: DC | PRN
  Administered 2014-11-29: 200 mg via INTRAVENOUS
  Administered 2014-11-29: 30 mg via INTRAVENOUS

## 2014-11-29 SURGICAL SUPPLY — 41 items
BANDAGE EYE OVAL (MISCELLANEOUS) ×8 IMPLANT
BLADE SURG 15 STRL LF DISP TIS (BLADE) IMPLANT
BLADE SURG 15 STRL SS (BLADE) ×4
CANISTER SUCT 1200ML W/VALVE (MISCELLANEOUS) ×4 IMPLANT
CATH ROBINSON RED A/P 12FR (CATHETERS) ×2 IMPLANT
COVER BACK TABLE 60X90IN (DRAPES) ×4 IMPLANT
COVER MAYO STAND STRL (DRAPES) ×4 IMPLANT
DEPRESSOR TONGUE BLADE STERILE (MISCELLANEOUS) ×4 IMPLANT
DRAPE U-SHAPE 76X120 STRL (DRAPES) ×4 IMPLANT
FILTER 7/8 IN (FILTER) ×4 IMPLANT
GLOVE SS BIOGEL STRL SZ 7.5 (GLOVE) ×2 IMPLANT
GLOVE SUPERSENSE BIOGEL SZ 7.5 (GLOVE) ×2
GOWN STRL REUS W/ TWL LRG LVL3 (GOWN DISPOSABLE) IMPLANT
GOWN STRL REUS W/ TWL XL LVL3 (GOWN DISPOSABLE) IMPLANT
GOWN STRL REUS W/TWL LRG LVL3 (GOWN DISPOSABLE) ×4
GOWN STRL REUS W/TWL XL LVL3 (GOWN DISPOSABLE)
GUARD TEETH (MISCELLANEOUS) IMPLANT
MARKER SKIN DUAL TIP RULER LAB (MISCELLANEOUS) IMPLANT
NDL PRECISIONGLIDE 27X1.5 (NEEDLE) IMPLANT
NDL SAFETY ECLIPSE 18X1.5 (NEEDLE) ×1 IMPLANT
NDL SPNL 22GX7 QUINCKE BK (NEEDLE) IMPLANT
NEEDLE HYPO 18GX1.5 SHARP (NEEDLE)
NEEDLE PRECISIONGLIDE 27X1.5 (NEEDLE) ×4 IMPLANT
NEEDLE SPNL 22GX7 QUINCKE BK (NEEDLE) IMPLANT
NS IRRIG 1000ML POUR BTL (IV SOLUTION) ×4 IMPLANT
PACK BASIN DAY SURGERY FS (CUSTOM PROCEDURE TRAY) ×4 IMPLANT
PATTIES SURGICAL .5 X3 (DISPOSABLE) ×4 IMPLANT
REDUCTION FITTING 1/4 IN (FILTER) ×4 IMPLANT
SHEET MEDIUM DRAPE 40X70 STRL (DRAPES) ×4 IMPLANT
SLEEVE SCD COMPRESS KNEE MED (MISCELLANEOUS) ×4 IMPLANT
SOLUTION BUTLER CLEAR DIP (MISCELLANEOUS) ×4 IMPLANT
SPONGE GAUZE 4X4 12PLY STER LF (GAUZE/BANDAGES/DRESSINGS) ×4 IMPLANT
SUCTION FRAZIER TIP 10 FR DISP (SUCTIONS) IMPLANT
SURGILUBE 2OZ TUBE FLIPTOP (MISCELLANEOUS) ×4 IMPLANT
SUT SILK 2 0 SH (SUTURE) ×4 IMPLANT
SYR 5ML LL (SYRINGE) ×1 IMPLANT
SYR CONTROL 10ML LL (SYRINGE) IMPLANT
TOWEL OR 17X24 6PK STRL BLUE (TOWEL DISPOSABLE) ×6 IMPLANT
TOWEL OR NON WOVEN STRL DISP B (DISPOSABLE) ×2 IMPLANT
TUBE CONNECTING 20'X1/4 (TUBING) ×2
TUBE CONNECTING 20X1/4 (TUBING) ×6 IMPLANT

## 2014-11-29 NOTE — Anesthesia Postprocedure Evaluation (Signed)
Anesthesia Post Note  Patient: [name]  Procedure(s) Performed: Procedure(s) (LRB): tracheal esophageal prosthesis change  (N/A) CO2 LASER APPLICATION (N/A) ESOPHAGOSCOPY WITH DILITATION  Anesthesia type: General  Patient location: PACU  Post pain: Pain level controlled and Adequate analgesia  Post assessment: Post-op Vital signs reviewed, Patient's Cardiovascular Status Stable, Respiratory Function Stable, Patent Airway and Pain level controlled  Last Vitals:  Filed Vitals:   11/29/14 1034  BP: 108/47  Pulse: 83  Temp: 36.4 C  Resp: 18    Post vital signs: Reviewed and stable  Level of consciousness: awake, alert  and oriented  Complications: No apparent anesthesia complications

## 2014-11-29 NOTE — Brief Op Note (Signed)
11/29/2014  9:14 AM  PATIENT:  [name]  81 y.o. male  PRE-OPERATIVE DIAGNOSIS:  TRACHEAL STENOSIS  POST-OPERATIVE DIAGNOSIS:  tracheal stenosis  PROCEDURE:  Procedure(s): voice prosthesis change  (N/A) CO2 LASER APPLICATION (N/A) ESOPHAGOSCOPY WITH DILITATION  SURGEON:  Surgeon(s) and Role:    * Rozetta Nunnery, MD - Primary  PHYSICIAN ASSISTANT:   ASSISTANTS: none   ANESTHESIA:   general  EBL:  Total I/O In: 1000 [I.V.:1000] Out: -   BLOOD ADMINISTERED:none  DRAINS: none   LOCAL MEDICATIONS USED:  NONE  SPECIMEN:  No Specimen  DISPOSITION OF SPECIMEN:  N/A  COUNTS:  YES  TOURNIQUET:  * No tourniquets in log *  DICTATION: .Other Dictation: Dictation Number [id number]  PLAN OF CARE: Discharge to home after PACU  PATIENT DISPOSITION:  PACU - hemodynamically stable.   Delay start of Pharmacological VTE agent (>24hrs) due to surgical blood loss or risk of bleeding: yes

## 2014-11-29 NOTE — Anesthesia Procedure Notes (Signed)
Procedure Name: Intubation Date/Time: 11/29/2014 7:47 AM Performed by: Melynda Ripple D Pre-anesthesia Checklist: Patient identified, Emergency Drugs available, Suction available and Patient being monitored Patient Re-evaluated:Patient Re-evaluated prior to inductionOxygen Delivery Method: Circle System Utilized Preoxygenation: Pre-oxygenation with 100% oxygen Intubation Type: IV induction Ventilation: Mask ventilation without difficulty Grade View: Grade I Tube type: tracheostomy. Tube size: 6.0 mm Number of attempts: 1 Airway Equipment and Method: Stylet and Oral airway Placement Confirmation: ETT inserted through vocal cords under direct vision,  positive ETCO2 and breath sounds checked- equal and bilateral Tube secured with: Tape Dental Injury: Teeth and Oropharynx as per pre-operative assessment

## 2014-11-29 NOTE — Interval H&P Note (Signed)
History and Physical Interval Note:  11/29/2014 7:31 AM  [name]  has presented today for surgery, with the diagnosis of TRACHEAL STENOSIS  The various methods of treatment have been discussed with the patient and family. After consideration of risks, benefits and other options for treatment, the patient has consented to  Procedure(s): TRACHEOSTOMY REVISION (N/A) CO2 LASER APPLICATION (N/A) as a surgical intervention .  The patient's history has been reviewed, patient examined, no change in status, stable for surgery.  I have reviewed the patient's chart and labs.  Questions were answered to the patient's satisfaction.     NEWMAN, CHRISTOPHER

## 2014-11-29 NOTE — Discharge Instructions (Addendum)
Apply antibiotic ointment to stoma daily Wear tracheal stoma stent at night Tylenol, motrin prn pain Throat lozenges for sore throat Call office for follow up appt in 1 week   [phone]   Post Anesthesia Home Care Instructions  Activity: Get plenty of rest for the remainder of the day. A responsible adult should stay with you for 24 hours following the procedure.  For the next 24 hours, DO NOT: -Drive a car -Paediatric nurse -Drink alcoholic beverages -Take any medication unless instructed by your physician -Make any legal decisions or sign important papers.  Meals: Start with liquid foods such as gelatin or soup. Progress to regular foods as tolerated. Avoid greasy, spicy, heavy foods. If nausea and/or vomiting occur, drink only clear liquids until the nausea and/or vomiting subsides. Call your physician if vomiting continues.  Special Instructions/Symptoms: Your throat may feel dry or sore from the anesthesia or the breathing tube placed in your throat during surgery. If this causes discomfort, gargle with warm salt water. The discomfort should disappear within 24 hours.

## 2014-11-29 NOTE — Transfer of Care (Signed)
Immediate Anesthesia Transfer of Care Note  Patient: [name]  Procedure(s) Performed: Procedure(s): voice prosthesis change  (N/A) CO2 LASER APPLICATION (N/A) ESOPHAGOSCOPY WITH DILITATION  Patient Location: PACU  Anesthesia Type:General  Level of Consciousness: sedated  Airway & Oxygen Therapy: Patient Spontanous Breathing and Patient connected to face mask oxygen  Post-op Assessment: Report given to RN and Post -op Vital signs reviewed and stable  Post vital signs: Reviewed and stable  Last Vitals:  Filed Vitals:   11/29/14 0633  BP: 119/70  Pulse: 88  Temp: 36.5 C  Resp: 18    Complications: No apparent anesthesia complications

## 2014-11-29 NOTE — Anesthesia Preprocedure Evaluation (Signed)
Anesthesia Evaluation  Patient identified by MRN, date of birth, ID band Patient awake    Reviewed: Allergy & Precautions, NPO status , Patient's Chart, lab work & pertinent test results  Airway Mallampati: Trach   Neck ROM: full    Dental   Pulmonary former smoker,          Cardiovascular hypertension,     Neuro/Psych Anxiety    GI/Hepatic GERD-  ,  Endo/Other    Renal/GU      Musculoskeletal   Abdominal   Peds  Hematology   Anesthesia Other Findings   Reproductive/Obstetrics                             Anesthesia Physical Anesthesia Plan  ASA: III  Anesthesia Plan: General   Post-op Pain Management:    Induction: Intravenous  Airway Management Planned: Tracheostomy  Additional Equipment:   Intra-op Plan:   Post-operative Plan: Extubation in OR  Informed Consent: I have reviewed the patients History and Physical, chart, labs and discussed the procedure including the risks, benefits and alternatives for the proposed anesthesia with the patient or authorized representative who has indicated his/her understanding and acceptance.     Plan Discussed with: CRNA, Anesthesiologist and Surgeon  Anesthesia Plan Comments:         Anesthesia Quick Evaluation

## 2014-11-29 NOTE — OR Nursing (Signed)
Esophagoscopy and Dilitation was added to procedure as part of the original procedure per Dr. Loletha Grayer. Newman. Alycia Patten RN

## 2014-11-29 NOTE — Op Note (Signed)
[name], [name]              ACCOUNT NO.:  [id number]  MEDICAL RECORD NO.:  [id number]  LOCATION:                                 FACILITY:  PHYSICIAN:  Leonides Sake. 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  Esophageal stricture.  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  Esophageal stricture.  OPERATION PERFORMED:  Tracheal stoma revision with a CO2 laser excision of granulation tissue and dilation.  Cervical esophagoscopy with dilation of upper cervical esophagus.  Change to a new Provox 8 mm prosthesis.  SURGEON:  Leonides Sake. Lucia Gaskins, M.D.  ANESTHESIA:  General endotracheal.  COMPLICATIONS:  None.  BRIEF CLINICAL NOTE:  The patient is an 81 year old gentleman who is 8-9 years status post laryngectomy.  He had a voice prosthesis placed about 7 or 8 years ago and has had this changed with a speech pathologist. More recently, he has had some extraneous granulation tissue growing over the voice prosthesis, interfering with the function.  He has always had a small tracheal stoma but has no trouble breathing and is taken to operating room at this time for laser excision of granulation tissue and exchanging the voice prosthesis.  DESCRIPTION OF PROCEDURE:  After adequate endotracheal inhalational anesthesia, cervical esophagoscopy was performed.  He had a little bit of scar tissue making passing the cervical esophagoscope to the area of the prosthesis a little bit difficult.  The area was dilated with the Savary dilators up to #16.  I was then able to pass the cervical esophagoscope to the area of the prosthesis.  The prosthesis was then removed, and a red  rubber catheter was passed through this stoma and passed out through the mouth.  Next, the stoma was dilated with the Savary dilators up to about 18.  Following this, the endotracheal tube was removed; and using a CO2 laser at 5 watts, the granulation tissue around the TE fistula was cauterized with 5 watts CO2 laser.  This completed the procedure.  The new Provox 8 mm prosthesis was then passed retrograde and placed back in the TE fistula area.  The patient tolerated this well.  He was awoken from anesthesia and transferred to the recovery room, postoperatively doing well.  DISPOSITION:  The patient will follow up in my office in 1 week for recheck.  He is instructed to wear his metal laryngeal stent at night to help with dilation of the stoma.          ______________________________ Leonides Sake. Lucia Gaskins, M.D.     CEN/MEDQ  D:  11/29/2014  T:  11/29/2014  Job:  [id number]

## 2014-11-30 ENCOUNTER — Encounter (HOSPITAL_BASED_OUTPATIENT_CLINIC_OR_DEPARTMENT_OTHER): Payer: Self-pay | Admitting: Otolaryngology

## 2014-11-30 DIAGNOSIS — R490 Dysphonia: Secondary | ICD-10-CM | POA: Diagnosis not present

## 2014-11-30 DIAGNOSIS — Z448 Encounter for fitting and adjustment of other external prosthetic devices: Secondary | ICD-10-CM | POA: Diagnosis not present

## 2015-01-12 DIAGNOSIS — C61 Malignant neoplasm of prostate: Secondary | ICD-10-CM | POA: Diagnosis not present

## 2015-01-16 DIAGNOSIS — K5900 Constipation, unspecified: Secondary | ICD-10-CM | POA: Diagnosis not present

## 2015-01-16 DIAGNOSIS — R3915 Urgency of urination: Secondary | ICD-10-CM | POA: Diagnosis not present

## 2015-01-16 DIAGNOSIS — N403 Nodular prostate with lower urinary tract symptoms: Secondary | ICD-10-CM | POA: Diagnosis not present

## 2015-01-16 DIAGNOSIS — Z8546 Personal history of malignant neoplasm of prostate: Secondary | ICD-10-CM | POA: Diagnosis not present

## 2015-01-20 ENCOUNTER — Ambulatory Visit (INDEPENDENT_AMBULATORY_CARE_PROVIDER_SITE_OTHER): Payer: Medicare Other | Admitting: Internal Medicine

## 2015-01-20 ENCOUNTER — Encounter: Payer: Self-pay | Admitting: Internal Medicine

## 2015-01-20 VITALS — BP 122/80 | HR 88 | Temp 98.5°F | Resp 18 | Ht 69.0 in | Wt 206.8 lb

## 2015-01-20 DIAGNOSIS — Z23 Encounter for immunization: Secondary | ICD-10-CM

## 2015-01-20 DIAGNOSIS — G4700 Insomnia, unspecified: Secondary | ICD-10-CM | POA: Diagnosis not present

## 2015-01-20 DIAGNOSIS — I10 Essential (primary) hypertension: Secondary | ICD-10-CM | POA: Diagnosis not present

## 2015-01-20 DIAGNOSIS — M549 Dorsalgia, unspecified: Secondary | ICD-10-CM | POA: Diagnosis not present

## 2015-01-20 MED ORDER — TIZANIDINE HCL 4 MG PO TABS: 4.0000 mg | ORAL_TABLET | Freq: Four times a day (QID) | ORAL | Status: DC | PRN

## 2015-01-20 MED ORDER — FLURAZEPAM HCL 30 MG PO CAPS: 30.0000 mg | ORAL_CAPSULE | Freq: Every evening | ORAL | Status: DC | PRN

## 2015-01-20 NOTE — Assessment & Plan Note (Signed)
Ok for dalmane asd qhs prn,  to f/u any worsening symptoms or concerns

## 2015-01-20 NOTE — Assessment & Plan Note (Signed)
stable overall by history and exam, recent data reviewed with pt, and pt to continue medical treatment as before,  to f/u any worsening symptoms or concerns BP Readings from Last 3 Encounters:  01/20/15 122/80  11/29/14 108/47  03/08/14 126/75

## 2015-01-20 NOTE — Assessment & Plan Note (Signed)
C/w trapezoid strain left > right, for muscel relaxer prn, reassured, likely related to overexertion with yardwork

## 2015-01-20 NOTE — Progress Notes (Signed)
Subjective:    Patient ID: [name], male    DOB: 05-08-1931, 81 y.o.   MRN: [id number]  HPI  Here with c/o 3-4 days onset bilat upper back pain, worse left near the neck, sharp, began mod to severe, now mild improving without specific tx except for rest, began after several hrs of yard work. Pt has had no bowel or bladder change, fever, wt loss,  worsening LE pain/numbness/weakness, gait change or falls.  Pt denies chest pain, increased sob or doe, wheezing, orthopnea, PND, increased LE swelling, palpitations, dizziness or syncope.  Does also have ongoing sleep difficulty, has done well with dalmane in past.Denies worsening depressive symptoms, suicidal ideation, or panic; has ongoing anxiety, not increased recently. Has most care routinely at [facility], declines need for labs, has cpx in aug 2016 at [place]. Due for prevnar Past Medical History  Diagnosis Date  . Laryngeal cancer      Dr Gwenlyn Saran  . GERD (gastroesophageal reflux disease)   . Adenocarcinoma of prostate, stage 1   . HYPERLIPIDEMIA   . ANXIETY   . HYPERTENSION   . ALLERGIC RHINITIS   . GERD   . DIVERTICULOSIS, COLON 07/06/2003  . BENIGN PROSTATIC HYPERTROPHY   . History of prostatitis   . Impaired glucose tolerance   . UTI (lower urinary tract infection) 2005  . Diverticulosis 07/06/2003  . Third nerve palsy 2005    painful, transient  . Esophageal stricture   . Squamous cell carcinoma   . Full dentures   . Wears glasses   . Radiation 02/14/14-04/12/14    prostate 47 gray   Past Surgical History  Procedure Laterality Date  . Lipoma excision      abd wall  . Tonsillectomy    . Total laryngectomy    . Esophageal dilation  01/10/2012    Dr. Lucia Gaskins  . Neck surgery  01/10/2012    tumor removed from back of neck  . Mass excision  01/10/2012    Procedure: EXCISION MASS;  Surgeon: Rozetta Nunnery, MD;  Location: [place];  Service: ENT;  Laterality: N/A;  posterior neck mass  . Tracheostomy revision N/A  10/12/2013    Procedure: TRACHEOSTOMA  REVSION w/ CO2 LASER;  Surgeon: Rozetta Nunnery, MD;  Location: [place];  Service: ENT;  Laterality: N/A;  . Esophageal dilation N/A 10/12/2013    Procedure: ESOPHAGEAL DILATION;  Surgeon: Rozetta Nunnery, MD;  Location: [place];  Service: ENT;  Laterality: N/A;  . Prostate biopsy  04/29/2005  . Tracheostomy revision N/A 11/29/2014    Procedure: tracheal esophageal prosthesis change ;  Surgeon: Rozetta Nunnery, MD;  Location: [place];  Service: ENT;  Laterality: N/A;  . Co2 laser application N/A 4/92/0100    Procedure: CO2 LASER APPLICATION;  Surgeon: Rozetta Nunnery, MD;  Location: [place];  Service: ENT;  Laterality: N/A;  . Esophagoscopy with dilitation  11/29/2014    Procedure: ESOPHAGOSCOPY WITH DILITATION;  Surgeon: Rozetta Nunnery, MD;  Location: [place];  Service: ENT;;    reports that he quit smoking about 28 years ago. His smoking use included Cigarettes. He has a 25 pack-year smoking history. He has never used smokeless tobacco. He reports that he does not drink alcohol or use illicit drugs. family history includes Diabetes in his brother and brother; Heart attack (age of onset: 54) in his father; Hypertension in his father. There is no history  of Colon cancer. Allergies  Allergen Reactions  . Terazosin Anaphylaxis    Patient said it made him dizzy   Current Outpatient Prescriptions on File Prior to Visit  Medication Sig Dispense Refill  . aspirin 81 MG tablet Take 81 mg by mouth daily.    Marland Kitchen lisinopril (PRINIVIL,ZESTRIL) 40 MG tablet Take 20 mg by mouth daily. 1/2 by mouth once daily    . Multiple Vitamin (MULTIVITAMIN) tablet Take 1 tablet by mouth daily.    . pantoprazole (PROTONIX) 40 MG tablet Take 40 mg by mouth 2 (two) times daily.    . ranitidine (ZANTAC) 150 MG capsule Take 150 mg by mouth every evening.     . Saw Palmetto  1000 MG CAPS Take 1,000 mg by mouth daily. 5 by mouth daily    . zolpidem (AMBIEN) 10 MG tablet Take 10 mg by mouth at bedtime as needed for sleep.      No current facility-administered medications on file prior to visit.   Review of Systems  Constitutional: Negative for unusual diaphoresis or night sweats HENT: Negative for ringing in ear or discharge Eyes: Negative for double vision or worsening visual disturbance.  Respiratory: Negative for choking and stridor.   Gastrointestinal: Negative for vomiting or other signifcant bowel change Genitourinary: Negative for hematuria or change in urine volume.  Musculoskeletal: Negative for other MSK pain or swelling Skin: Negative for color change and worsening wound.  Neurological: Negative for tremors and numbness other than noted  Psychiatric/Behavioral: Negative for decreased concentration or agitation other than above       Objective:   Physical Exam BP 122/80 mmHg  Pulse 88  Temp(Src) 98.5 F (36.9 C) (Oral)  Resp 18  Ht '5\' 9"'$  (1.753 m)  Wt 206 lb 12.8 oz (93.804 kg)  BMI 30.53 kg/m2  SpO2 96% VS noted,  Constitutional: Pt appears in no significant distress HENT: Head: NCAT.  Right Ear: External ear normal.  Left Ear: External ear normal.  Eyes: . Pupils are equal, round, and reactive to light. Conjunctivae and EOM are normal Neck: Normal range of motion. Neck supple.  Cardiovascular: Normal rate and regular rhythm.   Pulmonary/Chest: Effort normal and breath sounds without rales or wheezing.  Abd:  Soft, NT, ND, + BS Neurological: [name] alert. Not confused , motor grossly intact Skin: Skin is warm. No rash, no LE edema Psychiatric: Pt behavior is normal. No agitation. not depressed affect Bilat shoulders NT, FROM, no sweling or rash Has tender left > right trapezoid Neck without paravertebral tender Spine nontender    Assessment & Plan:

## 2015-01-20 NOTE — Patient Instructions (Signed)
You had the new Prevnar pneumonia shot today  Please take all new medication as prescribed - the muscle relaxer, and the dalmane for sleep  Please continue all other medications as before, and refills have been done if requested.  Please have the pharmacy call with any other refills you may need.  Please continue your efforts at being more active, low cholesterol diet, and weight control.  You are otherwise up to date with prevention measures today.  Please keep your appointments with your specialists as you may have planned  Please return in 6 months, or sooner if needed

## 2015-05-31 DIAGNOSIS — R490 Dysphonia: Secondary | ICD-10-CM | POA: Diagnosis not present

## 2015-06-09 DIAGNOSIS — Z8521 Personal history of malignant neoplasm of larynx: Secondary | ICD-10-CM | POA: Diagnosis not present

## 2015-06-09 DIAGNOSIS — Z448 Encounter for fitting and adjustment of other external prosthetic devices: Secondary | ICD-10-CM | POA: Diagnosis not present

## 2015-06-09 DIAGNOSIS — Z9002 Acquired absence of larynx: Secondary | ICD-10-CM | POA: Diagnosis not present

## 2015-06-22 ENCOUNTER — Ambulatory Visit (INDEPENDENT_AMBULATORY_CARE_PROVIDER_SITE_OTHER): Payer: Medicare Other

## 2015-06-22 DIAGNOSIS — Z23 Encounter for immunization: Secondary | ICD-10-CM

## 2015-07-26 DIAGNOSIS — Z8546 Personal history of malignant neoplasm of prostate: Secondary | ICD-10-CM | POA: Diagnosis not present

## 2015-08-01 ENCOUNTER — Ambulatory Visit (INDEPENDENT_AMBULATORY_CARE_PROVIDER_SITE_OTHER): Payer: Medicare Other | Admitting: Internal Medicine

## 2015-08-01 ENCOUNTER — Encounter: Payer: Self-pay | Admitting: Internal Medicine

## 2015-08-01 VITALS — BP 138/68 | HR 83 | Temp 98.4°F | Ht 69.0 in | Wt 212.0 lb

## 2015-08-01 DIAGNOSIS — R7302 Impaired glucose tolerance (oral): Secondary | ICD-10-CM | POA: Diagnosis not present

## 2015-08-01 DIAGNOSIS — G4700 Insomnia, unspecified: Secondary | ICD-10-CM | POA: Diagnosis not present

## 2015-08-01 DIAGNOSIS — E785 Hyperlipidemia, unspecified: Secondary | ICD-10-CM

## 2015-08-01 DIAGNOSIS — I10 Essential (primary) hypertension: Secondary | ICD-10-CM | POA: Diagnosis not present

## 2015-08-01 MED ORDER — FLURAZEPAM HCL 15 MG PO CAPS: ORAL_CAPSULE | ORAL | Status: DC

## 2015-08-01 NOTE — Progress Notes (Signed)
Pre visit review using our clinic review tool, if applicable. No additional management support is needed unless otherwise documented below in the visit note. 

## 2015-08-01 NOTE — Patient Instructions (Signed)
Please continue all other medications as before, and refills have been done if requested.  Please have the pharmacy call with any other refills you may need.  Please continue your efforts at being more active, low cholesterol diet, and weight control.  You are otherwise up to date with prevention measures today.  Please keep your appointments with your specialists as you may have planned  Please return in 6 months, or sooner if needed 

## 2015-08-02 DIAGNOSIS — R3915 Urgency of urination: Secondary | ICD-10-CM | POA: Diagnosis not present

## 2015-08-02 DIAGNOSIS — R351 Nocturia: Secondary | ICD-10-CM | POA: Diagnosis not present

## 2015-08-02 DIAGNOSIS — Z8546 Personal history of malignant neoplasm of prostate: Secondary | ICD-10-CM | POA: Diagnosis not present

## 2015-08-05 NOTE — Assessment & Plan Note (Signed)
Stable but persistent, for med refill,  to f/u any worsening symptoms or concerns

## 2015-08-05 NOTE — Assessment & Plan Note (Signed)
stable overall by history and exam, recent data reviewed with pt, and pt to continue medical treatment as before,  to f/u any worsening symptoms or concerns Lab Results  Component Value Date   WBC 16.7* 01/08/2014   HGB 15.3 11/29/2014   HCT 42.2 01/08/2014   PLT 179 01/08/2014   GLUCOSE 99 11/24/2014   ALT 26 01/08/2014   AST 25 01/08/2014   NA 141 11/24/2014   K 4.1 11/24/2014   CL 107 11/24/2014   CREATININE 1.16 11/24/2014   BUN 12 11/24/2014   CO2 28 11/24/2014   INR 1.1* 12/10/2011

## 2015-08-05 NOTE — Assessment & Plan Note (Signed)
stable overall by history and exam, recent data reviewed with pt, and pt to continue medical treatment as before,  to f/u any worsening symptoms or concerns, for f/u lab 

## 2015-08-05 NOTE — Progress Notes (Signed)
Subjective:    Patient ID: [name], male    DOB: Aug 24, 1931, 81 y.o.   MRN: [id number]  HPI    Here to f/u; overall doing ok,  Pt denies chest pain, increasing sob or doe, wheezing, orthopnea, PND, increased LE swelling, palpitations, dizziness or syncope.  Pt denies new neurological symptoms such as new headache, or facial or extremity weakness or numbness.  Pt denies polydipsia, polyuria, or low sugar episode.   Pt denies new neurological symptoms such as new headache, or facial or extremity weakness or numbness.   Pt states overall good compliance with meds, mostly trying to follow appropriate diet, with wt overall stable,  but little exercise however. Also with recurring insomnia, just cant get to sleep most nights Past Medical History  Diagnosis Date  . Laryngeal cancer ([place])      Dr Gwenlyn Saran  . GERD (gastroesophageal reflux disease)   . Adenocarcinoma of prostate, stage 1 ([place])   . HYPERLIPIDEMIA   . ANXIETY   . HYPERTENSION   . ALLERGIC RHINITIS   . GERD   . DIVERTICULOSIS, COLON 07/06/2003  . BENIGN PROSTATIC HYPERTROPHY   . History of prostatitis   . Impaired glucose tolerance   . UTI (lower urinary tract infection) 2005  . Diverticulosis 07/06/2003  . Third nerve palsy 2005    painful, transient  . Esophageal stricture   . Squamous cell carcinoma ([place])   . Full dentures   . Wears glasses   . Radiation 02/14/14-04/12/14    prostate 95 gray   Past Surgical History  Procedure Laterality Date  . Lipoma excision      abd wall  . Tonsillectomy    . Total laryngectomy    . Esophageal dilation  01/10/2012    Dr. Lucia Gaskins  . Neck surgery  01/10/2012    tumor removed from back of neck  . Mass excision  01/10/2012    Procedure: EXCISION MASS;  Surgeon: Rozetta Nunnery, MD;  Location: [place];  Service: ENT;  Laterality: N/A;  posterior neck mass  . Tracheostomy revision N/A 10/12/2013    Procedure: TRACHEOSTOMA  REVSION w/ CO2 LASER;  Surgeon:  Rozetta Nunnery, MD;  Location: [place];  Service: ENT;  Laterality: N/A;  . Esophageal dilation N/A 10/12/2013    Procedure: ESOPHAGEAL DILATION;  Surgeon: Rozetta Nunnery, MD;  Location: [place];  Service: ENT;  Laterality: N/A;  . Prostate biopsy  04/29/2005  . Tracheostomy revision N/A 11/29/2014    Procedure: tracheal esophageal prosthesis change ;  Surgeon: Rozetta Nunnery, MD;  Location: [place];  Service: ENT;  Laterality: N/A;  . Co2 laser application N/A 8/67/6720    Procedure: CO2 LASER APPLICATION;  Surgeon: Rozetta Nunnery, MD;  Location: [place];  Service: ENT;  Laterality: N/A;  . Esophagoscopy with dilitation  11/29/2014    Procedure: ESOPHAGOSCOPY WITH DILITATION;  Surgeon: Rozetta Nunnery, MD;  Location: [place];  Service: ENT;;    reports that he quit smoking about 28 years ago. His smoking use included Cigarettes. He has a 25 pack-year smoking history. He has never used smokeless tobacco. He reports that he does not drink alcohol or use illicit drugs. family history includes Diabetes in his brother and brother; Heart attack (age of onset: 61) in his father; Hypertension in his father. There is no history of Colon cancer. Allergies  Allergen Reactions  . Terazosin Anaphylaxis  Patient said it made him dizzy   Current Outpatient Prescriptions on File Prior to Visit  Medication Sig Dispense Refill  . aspirin 81 MG tablet Take 81 mg by mouth daily.    Marland Kitchen lisinopril (PRINIVIL,ZESTRIL) 40 MG tablet Take 20 mg by mouth daily. 1/2 by mouth once daily    . Multiple Vitamin (MULTIVITAMIN) tablet Take 1 tablet by mouth daily.    . pantoprazole (PROTONIX) 40 MG tablet Take 40 mg by mouth 2 (two) times daily.    . ranitidine (ZANTAC) 150 MG capsule Take 150 mg by mouth every evening.     . Saw Palmetto 1000 MG CAPS Take 1,000 mg by mouth daily. 5 by mouth daily    .  tiZANidine (ZANAFLEX) 4 MG tablet Take 1 tablet (4 mg total) by mouth every 6 (six) hours as needed for muscle spasms. 30 tablet 0  . zolpidem (AMBIEN) 10 MG tablet Take 10 mg by mouth at bedtime as needed for sleep.      No current facility-administered medications on file prior to visit.   Review of Systems  Constitutional: Negative for unusual diaphoresis or night sweats HENT: Negative for ringing in ear or discharge Eyes: Negative for double vision or worsening visual disturbance.  Respiratory: Negative for choking and stridor.   Gastrointestinal: Negative for vomiting or other signifcant bowel change Genitourinary: Negative for hematuria or change in urine volume.  Musculoskeletal: Negative for other MSK pain or swelling Skin: Negative for color change and worsening wound.  Neurological: Negative for tremors and numbness other than noted  Psychiatric/Behavioral: Negative for decreased concentration or agitation other than above       Objective:   Physical Exam BP 138/68 mmHg  Pulse 83  Temp(Src) 98.4 F (36.9 C) (Oral)  Ht '5\' 9"'$  (1.753 m)  Wt 212 lb (96.163 kg)  BMI 31.29 kg/m2  SpO2 96% VS noted,  Constitutional: Pt appears in no significant distress HENT: Head: NCAT.  Right Ear: External ear normal.  Left Ear: External ear normal.  Eyes: . Pupils are equal, round, and reactive to light. Conjunctivae and EOM are normal Neck: Normal range of motion. Neck supple.  Cardiovascular: Normal rate and regular rhythm.   Pulmonary/Chest: Effort normal and breath sounds without rales or wheezing.  Neurological: [name] alert. Not confused , motor grossly intact Skin: Skin is warm. No rash, no LE edema Psychiatric: Pt behavior is normal. No agitation.     Assessment & Plan:

## 2015-08-05 NOTE — Assessment & Plan Note (Signed)
stable overall by history and exam, recent data reviewed with pt, and pt to continue medical treatment as before,  to f/u any worsening symptoms or concerns BP Readings from Last 3 Encounters:  08/01/15 138/68  01/20/15 122/80  11/29/14 108/47

## 2015-08-07 DIAGNOSIS — H2513 Age-related nuclear cataract, bilateral: Secondary | ICD-10-CM | POA: Diagnosis not present

## 2015-08-07 DIAGNOSIS — H31012 Macula scars of posterior pole (postinflammatory) (post-traumatic), left eye: Secondary | ICD-10-CM | POA: Diagnosis not present

## 2015-11-22 DIAGNOSIS — Z9002 Acquired absence of larynx: Secondary | ICD-10-CM | POA: Diagnosis not present

## 2015-11-22 DIAGNOSIS — T85638A Leakage of other specified internal prosthetic devices, implants and grafts, initial encounter: Secondary | ICD-10-CM | POA: Diagnosis not present

## 2015-11-22 DIAGNOSIS — Z448 Encounter for fitting and adjustment of other external prosthetic devices: Secondary | ICD-10-CM | POA: Diagnosis not present

## 2016-01-30 ENCOUNTER — Encounter: Payer: Self-pay | Admitting: Internal Medicine

## 2016-01-30 ENCOUNTER — Ambulatory Visit (INDEPENDENT_AMBULATORY_CARE_PROVIDER_SITE_OTHER): Payer: Medicare Other | Admitting: Internal Medicine

## 2016-01-30 VITALS — BP 140/82 | HR 75 | Temp 97.4°F | Resp 20 | Wt 207.0 lb

## 2016-01-30 DIAGNOSIS — I10 Essential (primary) hypertension: Secondary | ICD-10-CM | POA: Diagnosis not present

## 2016-01-30 DIAGNOSIS — R7302 Impaired glucose tolerance (oral): Secondary | ICD-10-CM | POA: Diagnosis not present

## 2016-01-30 DIAGNOSIS — E785 Hyperlipidemia, unspecified: Secondary | ICD-10-CM

## 2016-01-30 NOTE — Progress Notes (Signed)
Subjective:    Patient ID: [name], male    DOB: October 25, 1930, 82 y.o.   MRN: [id number]  HPI  Here to f/u; overall doing ok,  Pt denies chest pain, increasing sob or doe, wheezing, orthopnea, PND, increased LE swelling, palpitations, dizziness or syncope.  Pt denies new neurological symptoms such as new headache, or facial or extremity weakness or numbness.  Pt denies polydipsia, polyuria, or low sugar episode.   Pt denies new neurological symptoms such as new headache, or facial or extremity weakness or numbness.   Pt states overall good compliance with meds, mostly trying to follow appropriate diet, with wt overall stable,  but little exercise however. BP usually 106 sbp per pt, just high today b/c he is here he believes.  Denies hyper or hypo thyroid symptoms such as voice, skin or hair change. Still sees Dr Jeffie Pollock for f/u psa, last one he recalls is .36, and getting lower. Wt stable Wt Readings from Last 3 Encounters:  01/30/16 207 lb (93.895 kg)  08/01/15 212 lb (96.163 kg)  01/20/15 206 lb 12.8 oz (93.804 kg)   Past Medical History  Diagnosis Date  . Laryngeal cancer ([place])      Dr Gwenlyn Saran  . GERD (gastroesophageal reflux disease)   . Adenocarcinoma of prostate, stage 1 ([place])   . HYPERLIPIDEMIA   . ANXIETY   . HYPERTENSION   . ALLERGIC RHINITIS   . GERD   . DIVERTICULOSIS, COLON 07/06/2003  . BENIGN PROSTATIC HYPERTROPHY   . History of prostatitis   . Impaired glucose tolerance   . UTI (lower urinary tract infection) 2005  . Diverticulosis 07/06/2003  . Third nerve palsy 2005    painful, transient  . Esophageal stricture   . Squamous cell carcinoma ([place])   . Full dentures   . Wears glasses   . Radiation 02/14/14-04/12/14    prostate 63 gray   Past Surgical History  Procedure Laterality Date  . Lipoma excision      abd wall  . Tonsillectomy    . Total laryngectomy    . Esophageal dilation  01/10/2012    Dr. Lucia Gaskins  . Neck surgery  01/10/2012    tumor removed from  back of neck  . Mass excision  01/10/2012    Procedure: EXCISION MASS;  Surgeon: Rozetta Nunnery, MD;  Location: [facility];  Service: ENT;  Laterality: N/A;  posterior neck mass  . Tracheostomy revision N/A 10/12/2013    Procedure: TRACHEOSTOMA  REVSION w/ CO2 LASER;  Surgeon: Rozetta Nunnery, MD;  Location: [facility];  Service: ENT;  Laterality: N/A;  . Esophageal dilation N/A 10/12/2013    Procedure: ESOPHAGEAL DILATION;  Surgeon: Rozetta Nunnery, MD;  Location: [facility];  Service: ENT;  Laterality: N/A;  . Prostate biopsy  04/29/2005  . Tracheostomy revision N/A 11/29/2014    Procedure: tracheal esophageal prosthesis change ;  Surgeon: Rozetta Nunnery, MD;  Location: [facility];  Service: ENT;  Laterality: N/A;  . Co2 laser application N/A 5/63/8756    Procedure: CO2 LASER APPLICATION;  Surgeon: Rozetta Nunnery, MD;  Location: [facility];  Service: ENT;  Laterality: N/A;  . Esophagoscopy with dilitation  11/29/2014    Procedure: ESOPHAGOSCOPY WITH DILITATION;  Surgeon: Rozetta Nunnery, MD;  Location: [facility];  Service: ENT;;    reports that he quit smoking about 29 years ago. His smoking use included Cigarettes. He  has a 25 pack-year smoking history. He has never used smokeless tobacco. He reports that he does not drink alcohol or use illicit drugs. family history includes Diabetes in his brother and brother; Heart attack (age of onset: 60) in his father; Hypertension in his father. There is no history of Colon cancer. Allergies  Allergen Reactions  . Terazosin Anaphylaxis    Patient said it made him dizzy   Current Outpatient Prescriptions on File Prior to Visit  Medication Sig Dispense Refill  . aspirin 81 MG tablet Take 81 mg by mouth daily.    . cholecalciferol (VITAMIN D) 1000 UNITS tablet Take 1,000 Units by mouth daily.    . flurazepam (DALMANE) 15 MG capsule  1-2 by mouth at bedtime as needed 60 capsule 5  . lisinopril (PRINIVIL,ZESTRIL) 40 MG tablet Take 20 mg by mouth daily. 1/2 by mouth once daily    . Multiple Vitamin (MULTIVITAMIN) tablet Take 1 tablet by mouth daily.    . pantoprazole (PROTONIX) 40 MG tablet Take 40 mg by mouth 2 (two) times daily.    . ranitidine (ZANTAC) 150 MG capsule Take 150 mg by mouth every evening.     . Saw Palmetto 1000 MG CAPS Take 1,000 mg by mouth daily. 5 by mouth daily    . zolpidem (AMBIEN) 10 MG tablet Take 10 mg by mouth at bedtime as needed for sleep.      No current facility-administered medications on file prior to visit.   Review of Systems  Constitutional: Negative for unusual diaphoresis or night sweats HENT: Negative for ear swelling or discharge Eyes: Negative for worsening visual haziness  Respiratory: Negative for choking and stridor.   Gastrointestinal: Negative for distension or worsening eructation Genitourinary: Negative for retention or change in urine volume.  Musculoskeletal: Negative for other MSK pain or swelling Skin: Negative for color change and worsening wound Neurological: Negative for tremors and numbness other than noted  Psychiatric/Behavioral: Negative for decreased concentration or agitation other than above  '    Objective:   Physical Exam BP 140/82 mmHg  Pulse 75  Temp(Src) 97.4 F (36.3 C) (Oral)  Resp 20  Wt 207 lb (93.895 kg)  SpO2 93% VS noted,  Constitutional: Pt appears in no apparent distress HENT: Head: NCAT.  Right Ear: External ear normal.  Left Ear: External ear normal.  Eyes: . Pupils are equal, round, and reactive to light. Conjunctivae and EOM are normal Neck: Normal range of motion. Neck supple.  Cardiovascular: Normal rate and regular rhythm.   Pulmonary/Chest: Effort normal and breath sounds without rales or wheezing.  Abd:  Soft, NT, ND, + BS Neurological: [name] alert. Not confused , motor grossly intact Skin: Skin is warm. No rash, no LE  edema Psychiatric: Pt behavior is normal. No agitation.     Assessment & Plan:

## 2016-01-30 NOTE — Assessment & Plan Note (Signed)
stable overall by history and exam, recent data reviewed with pt, and pt to continue medical treatment as before,  to f/u any worsening symptoms or concerns, tot chol 82 per [place] labs recently

## 2016-01-30 NOTE — Assessment & Plan Note (Signed)
stable overall by history and exam, recent data reviewed with pt, and pt to continue medical treatment as before,  to f/u any worsening symptoms or concerns BP Readings from Last 3 Encounters:  01/30/16 140/82  08/01/15 138/68  01/20/15 122/80

## 2016-01-30 NOTE — Patient Instructions (Signed)
Please continue all other medications as before, and refills have been done if requested.  Please have the pharmacy call with any other refills you may need.  Please continue your efforts at being more active, low cholesterol diet, and weight control.  You are otherwise up to date with prevention measures today.  Please keep your appointments with your specialists as you may have planned  Please return in 6 months, or sooner if needed 

## 2016-01-30 NOTE — Assessment & Plan Note (Signed)
stable overall by history and exam, recent data reviewed with pt, and pt to continue medical treatment as before,  to f/u any worsening symptoms or concerns, recent glc 75 per [place] labs, declines further lab today,  to f/u any worsening symptoms or concerns

## 2016-01-30 NOTE — Progress Notes (Signed)
Pre visit review using our clinic review tool, if applicable. No additional management support is needed unless otherwise documented below in the visit note. 

## 2016-01-31 DIAGNOSIS — Z8546 Personal history of malignant neoplasm of prostate: Secondary | ICD-10-CM | POA: Diagnosis not present

## 2016-02-07 DIAGNOSIS — Z0000 Encounter for general adult medical examination without abnormal findings: Secondary | ICD-10-CM | POA: Diagnosis not present

## 2016-02-07 DIAGNOSIS — R351 Nocturia: Secondary | ICD-10-CM | POA: Diagnosis not present

## 2016-02-07 DIAGNOSIS — N403 Nodular prostate with lower urinary tract symptoms: Secondary | ICD-10-CM | POA: Diagnosis not present

## 2016-02-07 DIAGNOSIS — K5900 Constipation, unspecified: Secondary | ICD-10-CM | POA: Diagnosis not present

## 2016-02-07 DIAGNOSIS — Z8546 Personal history of malignant neoplasm of prostate: Secondary | ICD-10-CM | POA: Diagnosis not present

## 2016-04-12 ENCOUNTER — Other Ambulatory Visit (INDEPENDENT_AMBULATORY_CARE_PROVIDER_SITE_OTHER): Payer: Medicare Other

## 2016-04-12 ENCOUNTER — Encounter: Payer: Self-pay | Admitting: Family

## 2016-04-12 ENCOUNTER — Ambulatory Visit (INDEPENDENT_AMBULATORY_CARE_PROVIDER_SITE_OTHER): Payer: Medicare Other | Admitting: Family

## 2016-04-12 VITALS — BP 164/82 | HR 75 | Temp 98.0°F | Resp 16 | Ht 69.0 in | Wt 207.0 lb

## 2016-04-12 DIAGNOSIS — R7302 Impaired glucose tolerance (oral): Secondary | ICD-10-CM | POA: Diagnosis not present

## 2016-04-12 DIAGNOSIS — R42 Dizziness and giddiness: Secondary | ICD-10-CM

## 2016-04-12 LAB — CBC
HEMATOCRIT: 41.8 % (ref 39.0–52.0)
Hemoglobin: 14.1 g/dL (ref 13.0–17.0)
MCHC: 33.7 g/dL (ref 30.0–36.0)
MCV: 84.9 fl (ref 78.0–100.0)
PLATELETS: 245 10*3/uL (ref 150.0–400.0)
RBC: 4.92 Mil/uL (ref 4.22–5.81)
RDW: 13.9 % (ref 11.5–15.5)
WBC: 6.2 10*3/uL (ref 4.0–10.5)

## 2016-04-12 LAB — COMPREHENSIVE METABOLIC PANEL
ALBUMIN: 4 g/dL (ref 3.5–5.2)
ALT: 31 U/L (ref 0–53)
AST: 33 U/L (ref 0–37)
Alkaline Phosphatase: 64 U/L (ref 39–117)
BUN: 10 mg/dL (ref 6–23)
CHLORIDE: 106 meq/L (ref 96–112)
CO2: 29 mEq/L (ref 19–32)
Calcium: 9.3 mg/dL (ref 8.4–10.5)
Creatinine, Ser: 1.04 mg/dL (ref 0.40–1.50)
GFR: 87.66 mL/min (ref >60.00)
Glucose, Bld: 142 mg/dL — ABNORMAL HIGH (ref 70–99)
POTASSIUM: 3.6 meq/L (ref 3.5–5.1)
SODIUM: 141 meq/L (ref 135–145)
Total Bilirubin: 0.3 mg/dL (ref 0.2–1.2)
Total Protein: 6.8 g/dL (ref 6.0–8.3)

## 2016-04-12 LAB — HEMOGLOBIN A1C: HEMOGLOBIN A1C: 5.8 % (ref 4.6–6.5)

## 2016-04-12 NOTE — Assessment & Plan Note (Addendum)
Dizziness of undetermined origin appears to be focused around meal consumption as symptoms occur when he does not eat for 4-5 hours with concern for diabetes and hypoglycemia. Indicates he was previously able to go 10 hours without problem. Obtain A1c, CBC, and complete metabolic panel. Glucose meter provided for home blood sugar checks to determine if he is hypoglycemic during dizzy episodes. Encouraged to continue to consume foods every 4-5 hours as able to reduce symptoms and follow-up with blood sugar readings. Advised to seek further emergency care symptoms worsen or do not improve with food.

## 2016-04-12 NOTE — Progress Notes (Signed)
Subjective:    Patient ID: [name], male    DOB: 02/16/1931, 82 y.o.   MRN: [id number]  Chief Complaint  Patient presents with  . Dizziness    x2 weeks has been having dizzy spells and chills, wanted to see what was going on    HPI:  [name] is a 82 y.o. male who  has a past medical history of Adenocarcinoma of prostate, stage 1 ([place]); ALLERGIC RHINITIS; ANXIETY; BENIGN PROSTATIC HYPERTROPHY; Diverticulosis (07/06/2003); DIVERTICULOSIS, COLON (07/06/2003); Esophageal stricture; Full dentures; GERD; GERD (gastroesophageal reflux disease); History of prostatitis; HYPERLIPIDEMIA; HYPERTENSION; Impaired glucose tolerance; Laryngeal cancer ([place]); Radiation (02/14/14-04/12/14); Squamous cell carcinoma ([place]); Third nerve palsy (2005); UTI (lower urinary tract infection) (2005); and Wears glasses. and presents today For an acute office visit.  This is a new problem. Associated symptom of dizzy spells and chills have been going on for approximately 2 weeks. Describes that if he goes without eating he generally gets dizzy spells and chills. Modifying factor includes eating or drinking something which helps to resolve the symptoms. They generally come on about every 4-5 hours and notes that it started a couple of days following a new diet which he has since stopped. He notes the symptoms have continued. Endorses a fall a couple of weeks later. He drinks 1 cup of coffee every morning and about 1 gallon of crystal light daily. No fevers. Denies urinary symptoms. Symptoms generally stay about the same. Endorses symptoms of reflux. No current symptoms.  Allergies  Allergen Reactions  . Terazosin Anaphylaxis    Patient said it made him dizzy     Current Outpatient Prescriptions on File Prior to Visit  Medication Sig Dispense Refill  . aspirin 81 MG tablet Take 81 mg by mouth daily.    . cholecalciferol (VITAMIN D) 1000 UNITS tablet Take 1,000 Units by mouth daily.    . flurazepam (DALMANE) 15  MG capsule 1-2 by mouth at bedtime as needed 60 capsule 5  . lisinopril (PRINIVIL,ZESTRIL) 40 MG tablet Take 20 mg by mouth daily. 1/2 by mouth once daily    . Multiple Vitamin (MULTIVITAMIN) tablet Take 1 tablet by mouth daily.    . pantoprazole (PROTONIX) 40 MG tablet Take 40 mg by mouth 2 (two) times daily.    . ranitidine (ZANTAC) 150 MG capsule Take 150 mg by mouth every evening.     . Saw Palmetto 1000 MG CAPS Take 1,000 mg by mouth daily. 5 by mouth daily    . zolpidem (AMBIEN) 10 MG tablet Take 10 mg by mouth at bedtime as needed for sleep.      No current facility-administered medications on file prior to visit.      Past Surgical History:  Procedure Laterality Date  . CO2 LASER APPLICATION N/A 12/23/8117   Procedure: CO2 LASER APPLICATION;  Surgeon: Rozetta Nunnery, MD;  Location: [facility];  Service: ENT;  Laterality: N/A;  . ESOPHAGEAL DILATION  01/10/2012   Dr. Lucia Gaskins  . ESOPHAGEAL DILATION N/A 10/12/2013   Procedure: ESOPHAGEAL DILATION;  Surgeon: Rozetta Nunnery, MD;  Location: [facility];  Service: ENT;  Laterality: N/A;  . ESOPHAGOSCOPY WITH DILITATION  11/29/2014   Procedure: ESOPHAGOSCOPY WITH DILITATION;  Surgeon: Rozetta Nunnery, MD;  Location: [facility];  Service: ENT;;  . LIPOMA EXCISION     abd wall  . MASS EXCISION  01/10/2012   Procedure: EXCISION MASS;  Surgeon: Rozetta Nunnery, MD;  Location: [facility];  Service: ENT;  Laterality: N/A;  posterior neck mass  . NECK SURGERY  01/10/2012   tumor removed from back of neck  . PROSTATE BIOPSY  04/29/2005  . TONSILLECTOMY    . TOTAL LARYNGECTOMY    . TRACHEOSTOMY REVISION N/A 10/12/2013   Procedure: TRACHEOSTOMA  REVSION w/ CO2 LASER;  Surgeon: Rozetta Nunnery, MD;  Location: [facility];  Service: ENT;  Laterality: N/A;  . TRACHEOSTOMY REVISION N/A 11/29/2014   Procedure: tracheal esophageal prosthesis change ;  Surgeon:  Rozetta Nunnery, MD;  Location: [facility];  Service: ENT;  Laterality: N/A;    Past Medical History:  Diagnosis Date  . Adenocarcinoma of prostate, stage 1 ([place])   . ALLERGIC RHINITIS   . ANXIETY   . BENIGN PROSTATIC HYPERTROPHY   . Diverticulosis 07/06/2003  . DIVERTICULOSIS, COLON 07/06/2003  . Esophageal stricture   . Full dentures   . GERD   . GERD (gastroesophageal reflux disease)   . History of prostatitis   . HYPERLIPIDEMIA   . HYPERTENSION   . Impaired glucose tolerance   . Laryngeal cancer ([place])     Dr Gwenlyn Saran  . Radiation 02/14/14-04/12/14   prostate 78 gray  . Squamous cell carcinoma ([place])   . Third nerve palsy 2005   painful, transient  . UTI (lower urinary tract infection) 2005  . Wears glasses     Review of Systems  Constitutional: Positive for chills. Negative for fever.  HENT: Negative for congestion.   Respiratory: Negative for cough, chest tightness, shortness of breath and wheezing.   Cardiovascular: Negative for chest pain, palpitations and leg swelling.  Genitourinary: Negative for dysuria, frequency, hematuria and urgency.  Neurological: Positive for dizziness and light-headedness.      Objective:    BP (!) 164/82 (BP Location: Left Arm, Patient Position: Sitting, Cuff Size: Normal)   Pulse 75   Temp 98 F (36.7 C) (Oral)   Resp 16   Ht '5\' 9"'$  (1.753 m)   Wt 207 lb (93.9 kg)   SpO2 95%   BMI 30.57 kg/m  Nursing note and vital signs reviewed.  Physical Exam  Constitutional: He is oriented to person, place, and time. He appears well-developed and well-nourished. No distress.  HENT:  Right Ear: Hearing, tympanic membrane, external ear and ear canal normal.  Left Ear: Hearing, tympanic membrane, external ear and ear canal normal.  Nose: Nose normal.  Mouth/Throat: Uvula is midline, oropharynx is clear and moist and mucous membranes are normal.  Cardiovascular: Normal rate, regular rhythm, normal heart sounds and intact  distal pulses.   Pulmonary/Chest: Effort normal and breath sounds normal. No respiratory distress. He has no wheezes. He has no rales. He exhibits no tenderness.  Neurological: He is alert and oriented to person, place, and time.  Skin: Skin is warm. He is not diaphoretic. No pallor.  Psychiatric: He has a normal mood and affect. His behavior is normal. Judgment and thought content normal.       Assessment & Plan:   Problem List Items Addressed This Visit      Endocrine   Impaired glucose tolerance - Primary   Relevant Orders   Hemoglobin A1c (Completed)     Other   Dizziness    Dizziness of undetermined origin appears to be focused around meal consumption as symptoms occur when he does not eat for 4-5 hours with concern for diabetes and hypoglycemia. Indicates he was previously able to go 10  hours without problem. Obtain A1c, CBC, and complete metabolic panel. Glucose meter provided for home blood sugar checks to determine if he is hypoglycemic during dizzy episodes. Encouraged to continue to consume foods every 4-5 hours as able to reduce symptoms and follow-up with blood sugar readings. Advised to seek further emergency care symptoms worsen or do not improve with food.      Relevant Orders   CBC (Completed)   Comprehensive metabolic panel (Completed)    Other Visit Diagnoses   None.      I am having Mr. Blumstein maintain his aspirin, lisinopril, Saw Palmetto, zolpidem, multivitamin, ranitidine, pantoprazole, cholecalciferol, and flurazepam.   Follow-up: Return if symptoms worsen or fail to improve.  Mauricio Po, FNP

## 2016-04-12 NOTE — Patient Instructions (Signed)
Thank you for choosing [facility].  Summary/Instructions:  Continue to monitor your symptoms and check your blood sugar with the event.   Please stop by the lab on the lower level of the building for your blood work. Your results will be released to [place] (or called to you) after review, usually within 72 hours after test completion. If any changes need to be made, you will be notified at that same time.  1. The lab is open from 7:30am to 5:30 pm Monday-Friday  2. No appointment is necessary  3. Fasting (if needed) is 6-8 hours after food and drink; black coffee and water  are okay   If your symptoms worsen or fail to improve, please contact our office for further instruction, or in case of emergency go directly to the emergency room at the closest medical facility.     Dizziness Dizziness is a common problem. It is a feeling of unsteadiness or light-headedness. You may feel like you are about to faint. Dizziness can lead to injury if you stumble or fall. Anyone can become dizzy, but dizziness is more common in older adults. This condition can be caused by a number of things, including medicines, dehydration, or illness. HOME CARE INSTRUCTIONS Taking these steps may help with your condition: Eating and Drinking  Drink enough fluid to keep your urine clear or pale yellow. This helps to keep you from becoming dehydrated. Try to drink more clear fluids, such as water.  Do not drink alcohol.  Limit your caffeine intake if directed by your health care provider.  Limit your salt intake if directed by your health care provider. Activity  Avoid making quick movements.  Rise slowly from chairs and steady yourself until you feel okay.  In the morning, first sit up on the side of the bed. When you feel okay, stand slowly while you hold onto something until you know that your balance is fine.  Move your legs often if you need to stand in one place for a long time. Tighten and relax  your muscles in your legs while you are standing.  Do not drive or operate heavy machinery if you feel dizzy.  Avoid bending down if you feel dizzy. Place items in your home so that they are easy for you to reach without leaning over. Lifestyle  Do not use any tobacco products, including cigarettes, chewing tobacco, or electronic cigarettes. If you need help quitting, ask your health care provider.  Try to reduce your stress level, such as with yoga or meditation. Talk with your health care provider if you need help. General Instructions  Watch your dizziness for any changes.  Take medicines only as directed by your health care provider. Talk with your health care provider if you think that your dizziness is caused by a medicine that you are taking.  Tell a friend or a family member that you are feeling dizzy. If he or she notices any changes in your behavior, have this person call your health care provider.  Keep all follow-up visits as directed by your health care provider. This is important. SEEK MEDICAL CARE IF:  Your dizziness does not go away.  Your dizziness or light-headedness gets worse.  You feel nauseous.  You have reduced hearing.  You have new symptoms.  You are unsteady on your feet or you feel like the room is spinning. SEEK IMMEDIATE MEDICAL CARE IF:  You vomit or have diarrhea and are unable to eat or drink anything.  You have problems talking, walking, swallowing, or using your arms, hands, or legs.  You feel generally weak.  You are not thinking clearly or you have trouble forming sentences. It may take a friend or family member to notice this.  You have chest pain, abdominal pain, shortness of breath, or sweating.  Your vision changes.  You notice any bleeding.  You have a headache.  You have neck pain or a stiff neck.  You have a fever.   This information is not intended to replace advice given to you by your health care provider. Make sure  you discuss any questions you have with your health care provider.   Document Released: 02/26/2001 Document Revised: 01/17/2015 Document Reviewed: 08/29/2014 Elsevier Interactive Patient Education 2016 Elsevier Inc[employer]  Hypoglycemia Hypoglycemia occurs when the glucose in your blood is too low. Glucose is a type of sugar that is your body's main energy source. Hormones, such as insulin and glucagon, control the level of glucose in the blood. Insulin lowers blood glucose and glucagon increases blood glucose. Having too much insulin in your blood stream, or not eating enough food containing sugar, can result in hypoglycemia. Hypoglycemia can happen to people with or without diabetes. It can develop quickly and can be a medical emergency.  CAUSES   Missing or delaying meals.  Not eating enough carbohydrates at meals.  Taking too much diabetes medicine.  Not timing your oral diabetes medicine or insulin doses with meals, snacks, and exercise.  Nausea and vomiting.  Certain medicines.  Severe illnesses, such as hepatitis, kidney disorders, and certain eating disorders.  Increased activity or exercise without eating something extra or adjusting medicines.  Drinking too much alcohol.  A nerve disorder that affects body functions like your heart rate, blood pressure, and digestion (autonomic neuropathy).  A condition where the stomach muscles do not function properly (gastroparesis). Therefore, medicines and food may not absorb properly.  Rarely, a tumor of the pancreas can produce too much insulin. SYMPTOMS   Hunger.  Sweating (diaphoresis).  Change in body temperature.  Shakiness.  Headache.  Anxiety.  Lightheadedness.  Irritability.  Difficulty concentrating.  Dry mouth.  Tingling or numbness in the hands or feet.  Restless sleep or sleep disturbances.  Altered speech and coordination.  Change in mental status.  Seizures or prolonged  convulsions.  Combativeness.  Drowsiness (lethargic).  Weakness.  Increased heart rate or palpitations.  Confusion.  Pale, gray skin color.  Blurred or double vision.  Fainting. DIAGNOSIS  A physical exam and medical history will be performed. Your caregiver may make a diagnosis based on your symptoms. Blood tests and other lab tests may be performed to confirm a diagnosis. Once the diagnosis is made, your caregiver will see if your signs and symptoms go away once your blood glucose is raised.  TREATMENT  Usually, you can easily treat your hypoglycemia when you notice symptoms.  Check your blood glucose. If it is less than 70 mg/dl, take one of the following:   3-4 glucose tablets.    cup juice.    cup regular soda.   1 cup skim milk.   -1 tube of glucose gel.   5-6 hard candies.   Avoid high-fat drinks or food that may delay a rise in blood glucose levels.  Do not take more than the recommended amount of sugary foods, drinks, gel, or tablets. Doing so will cause your blood glucose to go too high.   Wait 10-15 minutes and recheck your blood glucose.  If it is still less than 70 mg/dl or below your target range, repeat treatment.   Eat a snack if it is more than 1 hour until your next meal.  There may be a time when your blood glucose may go so low that you are unable to treat yourself at home when you start to notice symptoms. You may need someone to help you. You may even faint or be unable to swallow. If you cannot treat yourself, someone will need to bring you to the hospital.  [place]  If you have diabetes, follow your diabetes management plan by:  Taking your medicines as directed.  Following your exercise plan.  Following your meal plan. Do not skip meals. Eat on time.  Testing your blood glucose regularly. Check your blood glucose before and after exercise. If you exercise longer or different than usual, be sure to check blood  glucose more frequently.  Wearing your medical alert jewelry that says you have diabetes.  Identify the cause of your hypoglycemia. Then, develop ways to prevent the recurrence of hypoglycemia.  Do not take a hot bath or shower right after an insulin shot.  Always carry treatment with you. Glucose tablets are the easiest to carry.  If you are going to drink alcohol, drink it only with meals.  Tell friends or family members ways to keep you safe during a seizure. This may include removing hard or sharp objects from the area or turning you on your side.  Maintain a healthy weight. SEEK MEDICAL CARE IF:   You are having problems keeping your blood glucose in your target range.  You are having frequent episodes of hypoglycemia.  You feel you might be having side effects from your medicines.  You are not sure why your blood glucose is dropping so low.  You notice a change in vision or a new problem with your vision. SEEK IMMEDIATE MEDICAL CARE IF:   Confusion develops.  A change in mental status occurs.  The inability to swallow develops.  Fainting occurs.   This information is not intended to replace advice given to you by your health care provider. Make sure you discuss any questions you have with your health care provider.   Document Released: 09/02/2005 Document Revised: 09/07/2013 Document Reviewed: 05/09/2015 Elsevier Interactive Patient Education [employer].

## 2016-04-16 ENCOUNTER — Ambulatory Visit: Payer: Medicare Other | Admitting: Internal Medicine

## 2016-04-17 ENCOUNTER — Ambulatory Visit (INDEPENDENT_AMBULATORY_CARE_PROVIDER_SITE_OTHER): Payer: Medicare Other | Admitting: Internal Medicine

## 2016-04-17 ENCOUNTER — Encounter: Payer: Self-pay | Admitting: Internal Medicine

## 2016-04-17 VITALS — BP 128/68 | HR 75 | Temp 97.9°F | Wt 208.0 lb

## 2016-04-17 DIAGNOSIS — R7302 Impaired glucose tolerance (oral): Secondary | ICD-10-CM

## 2016-04-17 DIAGNOSIS — R42 Dizziness and giddiness: Secondary | ICD-10-CM | POA: Diagnosis not present

## 2016-04-17 DIAGNOSIS — I10 Essential (primary) hypertension: Secondary | ICD-10-CM | POA: Diagnosis not present

## 2016-04-17 NOTE — Assessment & Plan Note (Signed)
Asympt, glc recently more elevated than prior, wt stable and no polys, Wt Readings from Last 3 Encounters:  04/17/16 208 lb (94.3 kg)  04/12/16 207 lb (93.9 kg)  01/30/16 207 lb (93.9 kg)   Lab Results  Component Value Date   HGBA1C 5.8 04/12/2016

## 2016-04-17 NOTE — Progress Notes (Signed)
Pre visit review using our clinic review tool, if applicable. No additional management support is needed unless otherwise documented below in the visit note. 

## 2016-04-17 NOTE — Patient Instructions (Signed)
Please continue all other medications as before, and refills have been done if requested.  Please have the pharmacy call with any other refills you may need.  Please keep your appointments with your specialists as you may have planned  Please go to the LAB in the Basement (turn left off the elevator) for the tests to be done tomorrow  You will be contacted by phone if any changes need to be made immediately.  Otherwise, you will receive a letter about your results with an explanation, but please check with MyChart first.  Please remember to sign up for MyChart if you have not done so, as this will be important to you in the future with finding out test results, communicating by private email, and scheduling acute appointments online when needed.

## 2016-04-17 NOTE — Progress Notes (Signed)
Subjective:    Patient ID: [name], male    DOB: 11-01-1930, 82 y.o.   MRN: [id number]  HPI  Here to f/u, fortunately dizziness has resolved, Pt denies new neurological symptoms such as new headache, or facial or extremity weakness or numbness  Pt denies chest pain, increased sob or doe, wheezing, orthopnea, PND, increased LE swelling, palpitations, dizziness or syncope.   Pt denies polydipsia, polyuria.   Pt denies fever, wt loss, night sweats, loss of appetite, or other constitutional symptoms Past Medical History:  Diagnosis Date  . Adenocarcinoma of prostate, stage 1 ([place])   . ALLERGIC RHINITIS   . ANXIETY   . BENIGN PROSTATIC HYPERTROPHY   . Diverticulosis 07/06/2003  . DIVERTICULOSIS, COLON 07/06/2003  . Esophageal stricture   . Full dentures   . GERD   . GERD (gastroesophageal reflux disease)   . History of prostatitis   . HYPERLIPIDEMIA   . HYPERTENSION   . Impaired glucose tolerance   . Laryngeal cancer ([place])     Dr Gwenlyn Saran  . Radiation 02/14/14-04/12/14   prostate 78 gray  . Squamous cell carcinoma ([place])   . Third nerve palsy 2005   painful, transient  . UTI (lower urinary tract infection) 2005  . Wears glasses    Past Surgical History:  Procedure Laterality Date  . CO2 LASER APPLICATION N/A 1/74/0814   Procedure: CO2 LASER APPLICATION;  Surgeon: Rozetta Nunnery, MD;  Location: [place];  Service: ENT;  Laterality: N/A;  . ESOPHAGEAL DILATION  01/10/2012   Dr. Lucia Gaskins  . ESOPHAGEAL DILATION N/A 10/12/2013   Procedure: ESOPHAGEAL DILATION;  Surgeon: Rozetta Nunnery, MD;  Location: [place];  Service: ENT;  Laterality: N/A;  . ESOPHAGOSCOPY WITH DILITATION  11/29/2014   Procedure: ESOPHAGOSCOPY WITH DILITATION;  Surgeon: Rozetta Nunnery, MD;  Location: [place];  Service: ENT;;  . LIPOMA EXCISION     abd wall  . MASS EXCISION  01/10/2012   Procedure: EXCISION MASS;  Surgeon: Rozetta Nunnery,  MD;  Location: [place];  Service: ENT;  Laterality: N/A;  posterior neck mass  . NECK SURGERY  01/10/2012   tumor removed from back of neck  . PROSTATE BIOPSY  04/29/2005  . TONSILLECTOMY    . TOTAL LARYNGECTOMY    . TRACHEOSTOMY REVISION N/A 10/12/2013   Procedure: TRACHEOSTOMA  REVSION w/ CO2 LASER;  Surgeon: Rozetta Nunnery, MD;  Location: [place];  Service: ENT;  Laterality: N/A;  . TRACHEOSTOMY REVISION N/A 11/29/2014   Procedure: tracheal esophageal prosthesis change ;  Surgeon: Rozetta Nunnery, MD;  Location: [place];  Service: ENT;  Laterality: N/A;    reports that he quit smoking about 29 years ago. His smoking use included Cigarettes. He has a 25.00 pack-year smoking history. He has never used smokeless tobacco. He reports that he does not drink alcohol or use drugs. family history includes Diabetes in his brother and brother; Heart attack (age of onset: 96) in his father; Hypertension in his father. Allergies  Allergen Reactions  . Terazosin Anaphylaxis    Patient said it made him dizzy   Current Outpatient Prescriptions on File Prior to Visit  Medication Sig Dispense Refill  . aspirin 81 MG tablet Take 81 mg by mouth daily.    . cholecalciferol (VITAMIN D) 1000 UNITS tablet Take 1,000 Units by mouth daily.    . flurazepam (DALMANE) 15 MG capsule 1-2 by  mouth at bedtime as needed 60 capsule 5  . lisinopril (PRINIVIL,ZESTRIL) 40 MG tablet Take 20 mg by mouth daily. 1/2 by mouth once daily    . Multiple Vitamin (MULTIVITAMIN) tablet Take 1 tablet by mouth daily.    . pantoprazole (PROTONIX) 40 MG tablet Take 40 mg by mouth 2 (two) times daily.    . ranitidine (ZANTAC) 150 MG capsule Take 150 mg by mouth every evening.     . Saw Palmetto 1000 MG CAPS Take 1,000 mg by mouth daily. 5 by mouth daily    . zolpidem (AMBIEN) 10 MG tablet Take 10 mg by mouth at bedtime as needed for sleep.      No current facility-administered  medications on file prior to visit.    Review of Systems  Constitutional: Negative for unusual diaphoresis or night sweats HENT: Negative for ear swelling or discharge Eyes: Negative for worsening visual haziness  Respiratory: Negative for choking and stridor.   Gastrointestinal: Negative for distension or worsening eructation Genitourinary: Negative for retention or change in urine volume.  Musculoskeletal: Negative for other MSK pain or swelling Skin: Negative for color change and worsening wound Neurological: Negative for tremors and numbness other than noted  Psychiatric/Behavioral: Negative for decreased concentration or agitation other than above       Objective:   Physical Exam BP 128/68   Pulse 75   Temp 97.9 F (36.6 C)   Wt 208 lb (94.3 kg)   SpO2 97%   BMI 30.72 kg/m  VS noted, not ill appearing Constitutional: Pt appears in no apparent distress HENT: Head: NCAT.  Right Ear: External ear normal.  Left Ear: External ear normal.  Eyes: . Pupils are equal, round, and reactive to light. Conjunctivae and EOM are normal Neck: Normal range of motion. Neck supple.  Cardiovascular: Normal rate and regular rhythm.   Pulmonary/Chest: Effort normal and breath sounds without rales or wheezing.  Neurological: [name] alert. Not confused , motor grossly intact Skin: Skin is warm. No rash, no LE edema Psychiatric: Pt behavior is normal. No agitation.  Lab Results  Component Value Date   WBC 6.2 04/12/2016   HGB 14.1 04/12/2016   HCT 41.8 04/12/2016   PLT 245.0 04/12/2016   GLUCOSE 142 (H) 04/12/2016   ALT 31 04/12/2016   AST 33 04/12/2016   NA 141 04/12/2016   K 3.6 04/12/2016   CL 106 04/12/2016   CREATININE 1.04 04/12/2016   BUN 10 04/12/2016   CO2 29 04/12/2016   INR 1.1 (H) 12/10/2011   HGBA1C 5.8 04/12/2016       Assessment & Plan:

## 2016-04-17 NOTE — Assessment & Plan Note (Signed)
stable overall by history and exam, recent data reviewed with pt, and pt to continue medical treatment as before,  to f/u any worsening symptoms or concerns BP Readings from Last 3 Encounters:  04/17/16 128/68  04/12/16 (!) 164/82  01/30/16 140/82

## 2016-04-17 NOTE — Assessment & Plan Note (Signed)
Etiology unclear, symptoms resolved, labs stable,  to f/u any worsening symptoms or concerns

## 2016-04-18 ENCOUNTER — Encounter: Payer: Self-pay | Admitting: Internal Medicine

## 2016-04-18 ENCOUNTER — Telehealth: Payer: Self-pay | Admitting: *Deleted

## 2016-04-18 ENCOUNTER — Other Ambulatory Visit (INDEPENDENT_AMBULATORY_CARE_PROVIDER_SITE_OTHER): Payer: Medicare Other

## 2016-04-18 DIAGNOSIS — R7302 Impaired glucose tolerance (oral): Secondary | ICD-10-CM | POA: Diagnosis not present

## 2016-04-18 LAB — HEMOGLOBIN A1C: HEMOGLOBIN A1C: 5.8 % (ref 4.6–6.5)

## 2016-04-18 NOTE — Telephone Encounter (Signed)
-----   Message from Biagio Borg, MD sent at 04/17/2016  9:24 PM EDT ----- Regarding: a1c test Please call pt  He does NOT need to return for the Hgba1c test I ordered as I found this already done on April 12, 2016 and was Normal; his sugar is overall well controlled and does not require further tx

## 2016-04-18 NOTE — Telephone Encounter (Signed)
Pt has already came this am to have labs done...[name]

## 2016-04-18 NOTE — Telephone Encounter (Signed)
Called patient and advised of dr Gwynn Burly note, no need to return for a1c

## 2016-04-23 ENCOUNTER — Telehealth: Payer: Self-pay

## 2016-04-23 NOTE — Telephone Encounter (Signed)
-----   Message from Biagio Borg, MD sent at 04/17/2016  9:24 PM EDT ----- Regarding: a1c test Please call pt  He does NOT need to return for the Hgba1c test I ordered as I found this already done on April 12, 2016 and was Normal; his sugar is overall well controlled and does not require further tx

## 2016-04-23 NOTE — Telephone Encounter (Signed)
Called and spoke to patient he is aware of Dr. Judi Cong advisement and understands.

## 2016-04-24 DIAGNOSIS — Z9002 Acquired absence of larynx: Secondary | ICD-10-CM | POA: Diagnosis not present

## 2016-04-24 DIAGNOSIS — T85528A Displacement of other gastrointestinal prosthetic devices, implants and grafts, initial encounter: Secondary | ICD-10-CM | POA: Diagnosis not present

## 2016-04-25 DIAGNOSIS — T85528A Displacement of other gastrointestinal prosthetic devices, implants and grafts, initial encounter: Secondary | ICD-10-CM | POA: Diagnosis not present

## 2016-04-25 DIAGNOSIS — Z9002 Acquired absence of larynx: Secondary | ICD-10-CM | POA: Diagnosis not present

## 2016-05-02 DIAGNOSIS — Z448 Encounter for fitting and adjustment of other external prosthetic devices: Secondary | ICD-10-CM | POA: Diagnosis not present

## 2016-05-02 DIAGNOSIS — Z5189 Encounter for other specified aftercare: Secondary | ICD-10-CM | POA: Diagnosis not present

## 2016-05-02 DIAGNOSIS — Z9002 Acquired absence of larynx: Secondary | ICD-10-CM | POA: Diagnosis not present

## 2016-05-17 DIAGNOSIS — K222 Esophageal obstruction: Secondary | ICD-10-CM

## 2016-05-17 DIAGNOSIS — J398 Other specified diseases of upper respiratory tract: Secondary | ICD-10-CM

## 2016-05-17 HISTORY — DX: Esophageal obstruction: K22.2

## 2016-05-17 HISTORY — DX: Other specified diseases of upper respiratory tract: J39.8

## 2016-06-06 DIAGNOSIS — R490 Dysphonia: Secondary | ICD-10-CM | POA: Diagnosis not present

## 2016-06-06 DIAGNOSIS — R1313 Dysphagia, pharyngeal phase: Secondary | ICD-10-CM | POA: Diagnosis not present

## 2016-06-07 ENCOUNTER — Ambulatory Visit: Payer: Self-pay | Admitting: Otolaryngology

## 2016-06-07 ENCOUNTER — Encounter (HOSPITAL_BASED_OUTPATIENT_CLINIC_OR_DEPARTMENT_OTHER): Payer: Self-pay | Admitting: *Deleted

## 2016-06-07 NOTE — H&P (Signed)
PREOPERATIVE H&P  Chief Complaint: TEP obstructed with trouble swallowing  HPI: [name] is a 82 y.o. male who presents for evaluation of obstruction of his TEP prosthesis. He use to wear an 8 and was subsequently changed to a 10 but has more difficulty swallowing with a 10 in place. He's taken to the OR for revision of the stoma, dilation of the esophagus and changing the prosthesis to an 8.  Past Medical History:  Diagnosis Date  . Cataract, immature   . Esophageal stenosis 05/2016  . Full dentures   . GERD (gastroesophageal reflux disease)   . History of cancer of larynx 2009  . History of prostate cancer 2015  . History of radiation therapy 2015   for prostate cancer  . Hypertension    states under control with meds., has been on med. x 5-6 yr.  . 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 CO2 LASER APPLICATION N/A 03/11/9484   Procedure: CO2 LASER APPLICATION;  Surgeon: Rozetta Nunnery, MD;  Location: [place];  Service: ENT;  Laterality: N/A;  . DIRECT LARYNGOSCOPY  04/10/2007; 02/23/2007   with bx.  . ESOPHAGEAL DILATION  06/24/2008; 12/04/2009; 12/06/2010; 01/10/2012  . ESOPHAGEAL DILATION N/A 10/12/2013   Procedure: ESOPHAGEAL DILATION;  Surgeon: Rozetta Nunnery, MD;  Location: [facility];  Service: ENT;  Laterality: N/A;  . ESOPHAGOSCOPY WITH DILITATION  11/29/2014   Procedure: ESOPHAGOSCOPY WITH DILITATION;  Surgeon: Rozetta Nunnery, MD;  Location: [facility];  Service: ENT;;  . LIPOMA EXCISION     abd.   Marland Kitchen MASS EXCISION  01/10/2012   Procedure: EXCISION MASS;  Surgeon: Rozetta Nunnery, MD;  Location: [facility] [place];  Service: ENT;  Laterality: N/A;  posterior neck mass  . NECK SURGERY  01/10/2012   tumor removed from back of neck  . PLACEMENT OF TRACHEAL ESOPHAGEAL PROTHESIS, ESOPHAGOSCOPY WITH DILATION  04/07/2008  . PROSTATE BIOPSY  04/29/2005  .  TONSILLECTOMY    . TOTAL LARYNGECTOMY  10/28/2007  . TRACHEAL ESOPHAGEAL PROSTHESIS (TEP) CHANGE  12/04/2009; 12/06/2010  . TRACHEOSTOMY REVISION N/A 10/12/2013   Procedure: TRACHEOSTOMA  REVSION w/ CO2 LASER;  Surgeon: Rozetta Nunnery, MD;  Location: [facility];  Service: ENT;  Laterality: N/A;  . TRACHEOSTOMY REVISION N/A 11/29/2014   Procedure: tracheal esophageal prosthesis change ;  Surgeon: Rozetta Nunnery, MD;  Location: [place];  Service: ENT;  Laterality: N/A;   Social History   Social History  . Marital status: Married    Spouse name: N/A  . Number of children: 1  . Years of education: N/A   Occupational History  . Retired    Social History Main Topics  . Smoking status: Former Smoker    Years: 25.00    Quit date: 01/07/1987  . Smokeless tobacco: Never Used  . Alcohol use No  . Drug use: No  . Sexual activity: Not Asked   Other Topics Concern  . None   Social History Narrative   Daily caffeine    Family History  Problem Relation Age of Onset  . Hypertension Father   . Heart attack Father 55  . Diabetes Brother   . Diabetes Brother   . Colon cancer Neg Hx    Allergies  Allergen Reactions  . Terazosin Other (See Comments)    DIZZINESS   Prior to Admission medications   Medication Sig Start Date  End Date Taking? Authorizing Provider  amLODipine (NORVASC) 5 MG tablet Take 5 mg by mouth daily.   Yes Historical Provider, MD  aspirin 81 MG tablet Take 81 mg by mouth daily.   Yes Historical Provider, MD  cholecalciferol (VITAMIN D) 1000 UNITS tablet Take 1,000 Units by mouth daily.   Yes Historical Provider, MD  flurazepam [facility]) 15 MG capsule 1-2 by mouth at bedtime as needed 08/01/15  Yes Biagio Borg, MD  lisinopril (PRINIVIL,ZESTRIL) 40 MG tablet Take 40 mg by mouth daily.   Yes Historical Provider, MD  ranitidine (ZANTAC) 150 MG capsule Take 150 mg by mouth every evening.    Yes Historical Provider, MD      Positive ROS: per HPI  All other systems have been reviewed and were otherwise negative with the exception of those mentioned in the HPI and as above.  Physical Exam: There were no vitals filed for this visit.  General: Alert, no acute distress Oral: Normal oral mucosa and tonsils Nasal: Clear nasal passages Neck: No palpable adenopathy. Tracheal stoma with granulation tissue covering the TEP. Ear: Ear canal is clear with normal appearing TMs Cardiovascular: Regular rate and rhythm, no murmur.  Respiratory: Clear to auscultation Neurologic: Alert and oriented x 3   Assessment/Plan: esophageal stenosis, tracheal stoma stenosis Plan for Procedure(s): REVISION OF TRACHEAL STOMA WITH CO2 LASER, CHANGE OF TRACHEAL ESOPHAGEAL PROSTHESIS, ESOPHAGOSCOPY WITH DILATION CO2 LASER APPLICATION   Melony Overly, MD 06/07/2016 4:20 PM

## 2016-06-07 NOTE — Pre-Procedure Instructions (Signed)
To come for EKG 

## 2016-06-10 ENCOUNTER — Encounter (HOSPITAL_BASED_OUTPATIENT_CLINIC_OR_DEPARTMENT_OTHER): Admission: RE | Admit: 2016-06-10 | Payer: Medicare Other | Source: Ambulatory Visit

## 2016-06-10 DIAGNOSIS — Y831 Surgical operation with implant of artificial internal device as the cause of abnormal reaction of the patient, or of later complication, without mention of misadventure at the time of the procedure: Secondary | ICD-10-CM | POA: Diagnosis not present

## 2016-06-10 DIAGNOSIS — Z87891 Personal history of nicotine dependence: Secondary | ICD-10-CM | POA: Diagnosis not present

## 2016-06-10 DIAGNOSIS — Y833 Surgical operation with formation of external stoma as the cause of abnormal reaction of the patient, or of later complication, without mention of misadventure at the time of the procedure: Secondary | ICD-10-CM | POA: Diagnosis not present

## 2016-06-10 DIAGNOSIS — K219 Gastro-esophageal reflux disease without esophagitis: Secondary | ICD-10-CM | POA: Diagnosis not present

## 2016-06-10 DIAGNOSIS — J398 Other specified diseases of upper respiratory tract: Secondary | ICD-10-CM | POA: Diagnosis not present

## 2016-06-10 DIAGNOSIS — Z8521 Personal history of malignant neoplasm of larynx: Secondary | ICD-10-CM | POA: Diagnosis not present

## 2016-06-10 DIAGNOSIS — Z7982 Long term (current) use of aspirin: Secondary | ICD-10-CM | POA: Diagnosis not present

## 2016-06-10 DIAGNOSIS — Z8546 Personal history of malignant neoplasm of prostate: Secondary | ICD-10-CM | POA: Diagnosis not present

## 2016-06-10 DIAGNOSIS — Z79899 Other long term (current) drug therapy: Secondary | ICD-10-CM | POA: Diagnosis not present

## 2016-06-10 DIAGNOSIS — I10 Essential (primary) hypertension: Secondary | ICD-10-CM | POA: Diagnosis not present

## 2016-06-10 DIAGNOSIS — T85898A Other specified complication of other internal prosthetic devices, implants and grafts, initial encounter: Secondary | ICD-10-CM | POA: Diagnosis not present

## 2016-06-10 DIAGNOSIS — F419 Anxiety disorder, unspecified: Secondary | ICD-10-CM | POA: Diagnosis not present

## 2016-06-10 DIAGNOSIS — Z9002 Acquired absence of larynx: Secondary | ICD-10-CM | POA: Diagnosis not present

## 2016-06-10 DIAGNOSIS — Z923 Personal history of irradiation: Secondary | ICD-10-CM | POA: Diagnosis not present

## 2016-06-10 DIAGNOSIS — J9503 Malfunction of tracheostomy stoma: Secondary | ICD-10-CM | POA: Diagnosis not present

## 2016-06-10 DIAGNOSIS — K222 Esophageal obstruction: Secondary | ICD-10-CM | POA: Diagnosis not present

## 2016-06-14 ENCOUNTER — Encounter (HOSPITAL_BASED_OUTPATIENT_CLINIC_OR_DEPARTMENT_OTHER)
Admission: RE | Disposition: A | Discharge status: Home / Self Care | Payer: Self-pay | Source: Ambulatory Visit | Attending: Otolaryngology

## 2016-06-14 ENCOUNTER — Ambulatory Visit (HOSPITAL_BASED_OUTPATIENT_CLINIC_OR_DEPARTMENT_OTHER)
Admission: RE | Admit: 2016-06-14 | Discharge: 2016-06-14 | Disposition: A | Discharge status: Home / Self Care | Payer: Medicare Other | Source: Ambulatory Visit | Attending: Otolaryngology | Admitting: Otolaryngology

## 2016-06-14 ENCOUNTER — Ambulatory Visit (HOSPITAL_BASED_OUTPATIENT_CLINIC_OR_DEPARTMENT_OTHER): Payer: Medicare Other | Admitting: Anesthesiology

## 2016-06-14 ENCOUNTER — Encounter (HOSPITAL_BASED_OUTPATIENT_CLINIC_OR_DEPARTMENT_OTHER): Payer: Self-pay | Admitting: *Deleted

## 2016-06-14 DIAGNOSIS — J398 Other specified diseases of upper respiratory tract: Secondary | ICD-10-CM | POA: Diagnosis not present

## 2016-06-14 DIAGNOSIS — K222 Esophageal obstruction: Secondary | ICD-10-CM | POA: Insufficient documentation

## 2016-06-14 DIAGNOSIS — Z923 Personal history of irradiation: Secondary | ICD-10-CM | POA: Diagnosis not present

## 2016-06-14 DIAGNOSIS — Y833 Surgical operation with formation of external stoma as the cause of abnormal reaction of the patient, or of later complication, without mention of misadventure at the time of the procedure: Secondary | ICD-10-CM | POA: Insufficient documentation

## 2016-06-14 DIAGNOSIS — F419 Anxiety disorder, unspecified: Secondary | ICD-10-CM | POA: Insufficient documentation

## 2016-06-14 DIAGNOSIS — Z87891 Personal history of nicotine dependence: Secondary | ICD-10-CM | POA: Insufficient documentation

## 2016-06-14 DIAGNOSIS — J9503 Malfunction of tracheostomy stoma: Secondary | ICD-10-CM | POA: Insufficient documentation

## 2016-06-14 DIAGNOSIS — K219 Gastro-esophageal reflux disease without esophagitis: Secondary | ICD-10-CM | POA: Diagnosis not present

## 2016-06-14 DIAGNOSIS — Z9002 Acquired absence of larynx: Secondary | ICD-10-CM | POA: Diagnosis not present

## 2016-06-14 DIAGNOSIS — Y831 Surgical operation with implant of artificial internal device as the cause of abnormal reaction of the patient, or of later complication, without mention of misadventure at the time of the procedure: Secondary | ICD-10-CM | POA: Insufficient documentation

## 2016-06-14 DIAGNOSIS — T85898A Other specified complication of other internal prosthetic devices, implants and grafts, initial encounter: Secondary | ICD-10-CM | POA: Insufficient documentation

## 2016-06-14 DIAGNOSIS — Z79899 Other long term (current) drug therapy: Secondary | ICD-10-CM | POA: Insufficient documentation

## 2016-06-14 DIAGNOSIS — I10 Essential (primary) hypertension: Secondary | ICD-10-CM | POA: Insufficient documentation

## 2016-06-14 DIAGNOSIS — Z8521 Personal history of malignant neoplasm of larynx: Secondary | ICD-10-CM | POA: Diagnosis not present

## 2016-06-14 DIAGNOSIS — Z7982 Long term (current) use of aspirin: Secondary | ICD-10-CM | POA: Insufficient documentation

## 2016-06-14 DIAGNOSIS — Z8546 Personal history of malignant neoplasm of prostate: Secondary | ICD-10-CM | POA: Insufficient documentation

## 2016-06-14 HISTORY — DX: Personal history of irradiation: Z92.3

## 2016-06-14 HISTORY — PX: PLACEMENT OF TRACHEAL ESOPHAGEAL PROTHESIS, ESOPHAGOSCOPY WITH DILATION: SHX5564

## 2016-06-14 HISTORY — DX: Unspecified cataract: H26.9

## 2016-06-14 HISTORY — DX: Personal history of malignant neoplasm of larynx: Z85.21

## 2016-06-14 HISTORY — PX: CO2 LASER APPLICATION: SHX5778

## 2016-06-14 HISTORY — DX: Essential (primary) hypertension: I10

## 2016-06-14 HISTORY — DX: Esophageal obstruction: K22.2

## 2016-06-14 HISTORY — DX: Other specified diseases of upper respiratory tract: J39.8

## 2016-06-14 HISTORY — DX: Personal history of malignant neoplasm of prostate: Z85.46

## 2016-06-14 SURGERY — INSERTION, VOICE PROSTHESIS, TRACHEOESOPHAGEAL
Anesthesia: General | Site: Throat

## 2016-06-14 MED ORDER — EPHEDRINE 5 MG/ML INJ
INTRAVENOUS | Status: AC
  Filled 2016-06-14: qty 10

## 2016-06-14 MED ORDER — GLYCOPYRROLATE 0.2 MG/ML IV SOSY
PREFILLED_SYRINGE | INTRAVENOUS | Status: AC
  Filled 2016-06-14: qty 3

## 2016-06-14 MED ORDER — PHENYLEPHRINE 40 MCG/ML (10ML) SYRINGE FOR IV PUSH (FOR BLOOD PRESSURE SUPPORT)
PREFILLED_SYRINGE | INTRAVENOUS | Status: AC
  Filled 2016-06-14: qty 10

## 2016-06-14 MED ORDER — SCOPOLAMINE 1 MG/3DAYS TD PT72: 1.0000 | MEDICATED_PATCH | Freq: Once | TRANSDERMAL | Status: DC | PRN

## 2016-06-14 MED ORDER — PROPOFOL 500 MG/50ML IV EMUL
INTRAVENOUS | Status: AC
  Filled 2016-06-14: qty 50

## 2016-06-14 MED ORDER — CEFAZOLIN SODIUM-DEXTROSE 2-4 GM/100ML-% IV SOLN
2.0000 g | INTRAVENOUS | Status: AC
  Administered 2016-06-14: 2 g via INTRAVENOUS

## 2016-06-14 MED ORDER — FENTANYL CITRATE (PF) 100 MCG/2ML IJ SOLN
INTRAMUSCULAR | Status: AC
  Filled 2016-06-14: qty 2

## 2016-06-14 MED ORDER — ONDANSETRON HCL 4 MG/2ML IJ SOLN
INTRAMUSCULAR | Status: DC | PRN
  Administered 2016-06-14: 4 mg via INTRAVENOUS

## 2016-06-14 MED ORDER — KETOROLAC TROMETHAMINE 30 MG/ML IJ SOLN
INTRAMUSCULAR | Status: AC
  Filled 2016-06-14: qty 1

## 2016-06-14 MED ORDER — SODIUM CHLORIDE 0.9 % IJ SOLN
INTRAMUSCULAR | Status: AC
  Filled 2016-06-14: qty 10

## 2016-06-14 MED ORDER — OXYCODONE HCL 5 MG PO TABS
ORAL_TABLET | ORAL | Status: AC
  Filled 2016-06-14: qty 1

## 2016-06-14 MED ORDER — BACITRACIN 500 UNIT/GM EX OINT
TOPICAL_OINTMENT | CUTANEOUS | Status: DC | PRN
  Administered 2016-06-14: 1 via TOPICAL

## 2016-06-14 MED ORDER — METHYLENE BLUE 0.5 % INJ SOLN
INTRAVENOUS | Status: AC
  Filled 2016-06-14: qty 10

## 2016-06-14 MED ORDER — SODIUM CHLORIDE 0.9 % IJ SOLN
INTRAMUSCULAR | Status: AC
Start: 2016-06-14 — End: 2016-06-14
  Filled 2016-06-14: qty 10

## 2016-06-14 MED ORDER — GELATIN ADSORBABLE OP FILM
ORAL_FILM | OPHTHALMIC | Status: DC | PRN
  Administered 2016-06-14: 1

## 2016-06-14 MED ORDER — EPINEPHRINE HCL 1 MG/ML IJ SOLN
INTRAMUSCULAR | Status: AC
Start: 2016-06-14 — End: 2016-06-14
  Filled 2016-06-14: qty 1

## 2016-06-14 MED ORDER — FENTANYL CITRATE (PF) 100 MCG/2ML IJ SOLN
INTRAMUSCULAR | Status: DC | PRN
Start: 2016-06-14 — End: 2016-06-14
  Administered 2016-06-14: 100 ug via INTRAVENOUS
  Administered 2016-06-14: 25 ug via INTRAVENOUS

## 2016-06-14 MED ORDER — PROPOFOL 10 MG/ML IV BOLUS
INTRAVENOUS | Status: DC | PRN
  Administered 2016-06-14 (×2): 50 mg via INTRAVENOUS
  Administered 2016-06-14: 200 mg via INTRAVENOUS
  Administered 2016-06-14 (×2): 50 mg via INTRAVENOUS

## 2016-06-14 MED ORDER — FENTANYL CITRATE (PF) 100 MCG/2ML IJ SOLN
25.0000 ug | INTRAMUSCULAR | Status: DC | PRN
  Administered 2016-06-14: 50 ug via INTRAVENOUS

## 2016-06-14 MED ORDER — EPINEPHRINE HCL 1 MG/ML IJ SOLN
INTRAMUSCULAR | Status: DC | PRN
  Administered 2016-06-14: 1 mg via ENDOTRACHEOPULMONARY

## 2016-06-14 MED ORDER — LIDOCAINE HCL (CARDIAC) 20 MG/ML IV SOLN
INTRAVENOUS | Status: DC | PRN
Start: 2016-06-14 — End: 2016-06-14
  Administered 2016-06-14: 30 mg via INTRAVENOUS

## 2016-06-14 MED ORDER — SODIUM CHLORIDE 0.9 % IN NEBU
INHALATION_SOLUTION | RESPIRATORY_TRACT | Status: AC
  Filled 2016-06-14: qty 3

## 2016-06-14 MED ORDER — BACITRACIN ZINC 500 UNIT/GM EX OINT
TOPICAL_OINTMENT | CUTANEOUS | Status: AC
  Filled 2016-06-14: qty 28.35

## 2016-06-14 MED ORDER — SUGAMMADEX SODIUM 200 MG/2ML IV SOLN
INTRAVENOUS | Status: AC
  Filled 2016-06-14: qty 2

## 2016-06-14 MED ORDER — ARTIFICIAL TEARS OP OINT
TOPICAL_OINTMENT | OPHTHALMIC | Status: AC
  Filled 2016-06-14: qty 3.5

## 2016-06-14 MED ORDER — FENTANYL CITRATE (PF) 100 MCG/2ML IJ SOLN: 50.0000 ug | INTRAMUSCULAR | Status: DC | PRN

## 2016-06-14 MED ORDER — CEFAZOLIN SODIUM-DEXTROSE 2-4 GM/100ML-% IV SOLN
INTRAVENOUS | Status: AC
  Filled 2016-06-14: qty 100

## 2016-06-14 MED ORDER — ROCURONIUM BROMIDE 100 MG/10ML IV SOLN
INTRAVENOUS | Status: DC | PRN
  Administered 2016-06-14: 20 mg via INTRAVENOUS
  Administered 2016-06-14 (×2): 5 mg via INTRAVENOUS

## 2016-06-14 MED ORDER — ONDANSETRON HCL 4 MG/2ML IJ SOLN
INTRAMUSCULAR | Status: AC
  Filled 2016-06-14: qty 2

## 2016-06-14 MED ORDER — MUPIROCIN 2 % EX OINT
TOPICAL_OINTMENT | CUTANEOUS | Status: AC
  Filled 2016-06-14: qty 22

## 2016-06-14 MED ORDER — DEXAMETHASONE SODIUM PHOSPHATE 4 MG/ML IJ SOLN
INTRAMUSCULAR | Status: DC | PRN
  Administered 2016-06-14: 10 mg via INTRAVENOUS

## 2016-06-14 MED ORDER — LACTATED RINGERS IV SOLN
INTRAVENOUS | Status: DC
  Administered 2016-06-14 (×2): via INTRAVENOUS
  Administered 2016-06-14: 10 mL/h via INTRAVENOUS

## 2016-06-14 MED ORDER — SUGAMMADEX SODIUM 200 MG/2ML IV SOLN
INTRAVENOUS | Status: DC | PRN
  Administered 2016-06-14: 200 mg via INTRAVENOUS

## 2016-06-14 MED ORDER — OXYCODONE HCL 5 MG PO TABS
5.0000 mg | ORAL_TABLET | Freq: Once | ORAL | Status: AC
Start: 2016-06-14 — End: 2016-06-14
  Administered 2016-06-14: 5 mg via ORAL

## 2016-06-14 MED ORDER — LIDOCAINE 2% (20 MG/ML) 5 ML SYRINGE
INTRAMUSCULAR | Status: AC
Start: 2016-06-14 — End: 2016-06-14
  Filled 2016-06-14: qty 5

## 2016-06-14 MED ORDER — AMOXICILLIN 250 MG/5ML PO SUSR: 500.0000 mg | Freq: Two times a day (BID) | ORAL | 0 refills | Status: DC

## 2016-06-14 MED ORDER — DEXAMETHASONE SODIUM PHOSPHATE 10 MG/ML IJ SOLN
INTRAMUSCULAR | Status: AC
Start: 2016-06-14 — End: 2016-06-14
  Filled 2016-06-14: qty 1

## 2016-06-14 MED ORDER — ROCURONIUM BROMIDE 10 MG/ML (PF) SYRINGE
PREFILLED_SYRINGE | INTRAVENOUS | Status: AC
Start: 2016-06-14 — End: 2016-06-14
  Filled 2016-06-14: qty 10

## 2016-06-14 MED ORDER — PHENYLEPHRINE HCL 10 MG/ML IJ SOLN
INTRAMUSCULAR | Status: DC | PRN
  Administered 2016-06-14 (×2): 80 ug via INTRAVENOUS
  Administered 2016-06-14: 40 ug via INTRAVENOUS
  Administered 2016-06-14: 80 ug via INTRAVENOUS
  Administered 2016-06-14: 40 ug via INTRAVENOUS
  Administered 2016-06-14 (×3): 80 ug via INTRAVENOUS

## 2016-06-14 MED ORDER — MIDAZOLAM HCL 2 MG/2ML IJ SOLN: 1.0000 mg | INTRAMUSCULAR | Status: DC | PRN

## 2016-06-14 MED ORDER — LIDOCAINE-EPINEPHRINE 1 %-1:100000 IJ SOLN
INTRAMUSCULAR | Status: AC
  Filled 2016-06-14: qty 1

## 2016-06-14 MED ORDER — EPHEDRINE SULFATE 50 MG/ML IJ SOLN
INTRAMUSCULAR | Status: DC | PRN
  Administered 2016-06-14: 10 mg via INTRAVENOUS

## 2016-06-14 MED ORDER — GLYCOPYRROLATE 0.2 MG/ML IJ SOLN
0.2000 mg | Freq: Once | INTRAMUSCULAR | Status: AC | PRN
  Administered 2016-06-14: 0.2 mg via INTRAVENOUS

## 2016-06-14 MED ORDER — PROPOFOL 10 MG/ML IV BOLUS
INTRAVENOUS | Status: AC
  Filled 2016-06-14: qty 20

## 2016-06-14 SURGICAL SUPPLY — 51 items
APPLICATOR COTTON TIP 6IN STRL (MISCELLANEOUS) ×2 IMPLANT
BANDAGE EYE OVAL (MISCELLANEOUS) ×6 IMPLANT
BLADE SURG 15 STRL LF DISP TIS (BLADE) ×1 IMPLANT
BLADE SURG 15 STRL SS (BLADE) ×3
CANISTER SUCT 1200ML W/VALVE (MISCELLANEOUS) ×3 IMPLANT
CATH ROBINSON RED A/P 12FR (CATHETERS) ×3 IMPLANT
COVER BACK TABLE 60X90IN (DRAPES) ×3 IMPLANT
COVER MAYO STAND STRL (DRAPES) ×3 IMPLANT
DEPRESSOR TONGUE BLADE STERILE (MISCELLANEOUS) ×3 IMPLANT
DRAPE U-SHAPE 76X120 STRL (DRAPES) ×3 IMPLANT
ELECT REM PT RETURN 9FT ADLT (ELECTROSURGICAL) ×3
ELECTRODE REM PT RTRN 9FT ADLT (ELECTROSURGICAL) IMPLANT
FILTER 7/8 IN (FILTER) ×3 IMPLANT
GLOVE BIO SURGEON STRL SZ7.5 (GLOVE) ×2 IMPLANT
GLOVE BIOGEL PI IND STRL 7.0 (GLOVE) IMPLANT
GLOVE BIOGEL PI IND STRL 8 (GLOVE) ×1 IMPLANT
GLOVE BIOGEL PI INDICATOR 7.0 (GLOVE) ×2
GLOVE BIOGEL PI INDICATOR 8 (GLOVE) ×2
GLOVE ECLIPSE 6.5 STRL STRAW (GLOVE) ×2 IMPLANT
GLOVE SS BIOGEL STRL SZ 7.5 (GLOVE) ×1 IMPLANT
GLOVE SUPERSENSE BIOGEL SZ 7.5 (GLOVE) ×2
GOWN STRL REUS W/ TWL LRG LVL3 (GOWN DISPOSABLE) ×1 IMPLANT
GOWN STRL REUS W/ TWL XL LVL3 (GOWN DISPOSABLE) IMPLANT
GOWN STRL REUS W/TWL LRG LVL3 (GOWN DISPOSABLE) ×3
GOWN STRL REUS W/TWL XL LVL3 (GOWN DISPOSABLE) ×3
GUARD TEETH (MISCELLANEOUS) ×2 IMPLANT
MARKER SKIN DUAL TIP RULER LAB (MISCELLANEOUS) IMPLANT
NDL PRECISIONGLIDE 27X1.5 (NEEDLE) IMPLANT
NDL SAFETY ECLIPSE 18X1.5 (NEEDLE) ×1 IMPLANT
NDL SPNL 22GX7 QUINCKE BK (NEEDLE) IMPLANT
NEEDLE HYPO 18GX1.5 SHARP (NEEDLE) ×3
NEEDLE PRECISIONGLIDE 27X1.5 (NEEDLE) IMPLANT
NEEDLE SPNL 22GX7 QUINCKE BK (NEEDLE) IMPLANT
NS IRRIG 1000ML POUR BTL (IV SOLUTION) ×3 IMPLANT
PACK BASIN DAY SURGERY FS (CUSTOM PROCEDURE TRAY) ×3 IMPLANT
PATTIES SURGICAL .5 X3 (DISPOSABLE) ×3 IMPLANT
PROSTHESIS PROVOX 8MM ×2 IMPLANT
REDUCTION FITTING 1/4 IN (FILTER) ×3 IMPLANT
SHEET MEDIUM DRAPE 40X70 STRL (DRAPES) ×2 IMPLANT
SLEEVE SCD COMPRESS KNEE MED (MISCELLANEOUS) ×3 IMPLANT
SOLUTION BUTLER CLEAR DIP (MISCELLANEOUS) ×1 IMPLANT
SPONGE GAUZE 4X4 12PLY STER LF (GAUZE/BANDAGES/DRESSINGS) ×2 IMPLANT
SUCTION FRAZIER HANDLE 10FR (MISCELLANEOUS) ×2
SUCTION TUBE FRAZIER 10FR DISP (MISCELLANEOUS) IMPLANT
SURGILUBE 2OZ TUBE FLIPTOP (MISCELLANEOUS) ×3 IMPLANT
SUT SILK 2 0 SH (SUTURE) ×3 IMPLANT
SYR 5ML LL (SYRINGE) ×3 IMPLANT
SYR CONTROL 10ML LL (SYRINGE) ×2 IMPLANT
TOWEL OR 17X24 6PK STRL BLUE (TOWEL DISPOSABLE) ×6 IMPLANT
TUBE CONNECTING 20'X1/4 (TUBING) ×2
TUBE CONNECTING 20X1/4 (TUBING) ×4 IMPLANT

## 2016-06-14 NOTE — Anesthesia Postprocedure Evaluation (Signed)
Anesthesia Post Note  Patient: [name]  Procedure(s) Performed: Procedure(s) (LRB): REVISION OF TRACHEAL STOMA WITH CO2 LASER, CHANGE OF TRACHEAL ESOPHAGEAL PROSTHESIS, ESOPHAGOSCOPY WITH DILATION (N/A) CO2 LASER APPLICATION (N/A)  Patient location during evaluation: PACU Anesthesia Type: General Level of consciousness: awake and alert Pain management: pain level controlled Vital Signs Assessment: post-procedure vital signs reviewed and stable Respiratory status: spontaneous breathing, nonlabored ventilation and respiratory function stable Cardiovascular status: blood pressure returned to baseline and stable Postop Assessment: no signs of nausea or vomiting Anesthetic complications: no    Last Vitals:  Vitals:   06/14/16 1030 06/14/16 1045  BP: 135/74 (!) 110/57  Pulse: 88 82  Resp: 10 (!) 9  Temp:      Last Pain:  Vitals:   06/14/16 1100  TempSrc:   PainSc: 3                  Lenny Fiumara,W. EDMOND

## 2016-06-14 NOTE — Brief Op Note (Signed)
06/14/2016  10:06 AM  PATIENT:  [name]  82 y.o. male  PRE-OPERATIVE DIAGNOSIS:  esophageal stenosis, tracheal stoma stenosis  POST-OPERATIVE DIAGNOSIS:  esophageal stenosis, tracheal stoma stenosis  PROCEDURE:  Procedure(s): REVISION OF TRACHEAL STOMA WITH CO2 LASER, CHANGE OF TRACHEAL ESOPHAGEAL PROSTHESIS, ESOPHAGOSCOPY WITH DILATION (N/A) CO2 LASER APPLICATION (N/A)  SURGEON:  Surgeon(s) and Role:    * Rozetta Nunnery, MD - Primary  PHYSICIAN ASSISTANT:   ASSISTANTS: none   ANESTHESIA:   general  EBL:  Total I/O In: 2000 [I.V.:2000] Out: 12 [Blood:12]  BLOOD ADMINISTERED:none  DRAINS: none   LOCAL MEDICATIONS USED:  NONE  SPECIMEN:  No Specimen  DISPOSITION OF SPECIMEN:  N/A  COUNTS:  YES  TOURNIQUET:  * No tourniquets in log *  DICTATION: .Other Dictation: Dictation Number [id number]  PLAN OF CARE: Discharge to home after PACU  PATIENT DISPOSITION:  PACU - hemodynamically stable.   Delay start of Pharmacological VTE agent (>24hrs) due to surgical blood loss or risk of bleeding: yes

## 2016-06-14 NOTE — Interval H&P Note (Signed)
History and Physical Interval Note:  06/14/2016 7:23 AM  [name]  has presented today for surgery, with the diagnosis of esophageal stenosis, tracheal stoma stenosis  The various methods of treatment have been discussed with the patient and family. After consideration of risks, benefits and other options for treatment, the patient has consented to  Procedure(s): REVISION OF TRACHEAL STOMA WITH CO2 LASER, CHANGE OF TRACHEAL ESOPHAGEAL PROSTHESIS, ESOPHAGOSCOPY WITH DILATION (N/A) CO2 LASER APPLICATION (N/A) as a surgical intervention .  The patient's history has been reviewed, patient examined, no change in status, stable for surgery.  I have reviewed the patient's chart and labs.  Questions were answered to the patient's satisfaction.     CHRISTOPHER NEWMAN

## 2016-06-14 NOTE — Discharge Instructions (Addendum)
Take amoxicillin 500 mg twice per day for the next week start tonight Tylenol , motrin  Prn pain Call office for follow up app in 2 weeks     [phone]    Post Anesthesia Home Care Instructions  Activity: Get plenty of rest for the remainder of the day. A responsible adult should stay with you for 24 hours following the procedure.  For the next 24 hours, DO NOT: -Drive a car -Paediatric nurse -Drink alcoholic beverages -Take any medication unless instructed by your physician -Make any legal decisions or sign important papers.  Meals: Start with liquid foods such as gelatin or soup. Progress to regular foods as tolerated. Avoid greasy, spicy, heavy foods. If nausea and/or vomiting occur, drink only clear liquids until the nausea and/or vomiting subsides. Call your physician if vomiting continues.  Special Instructions/Symptoms: Your throat may feel dry or sore from the anesthesia or the breathing tube placed in your throat during surgery. If this causes discomfort, gargle with warm salt water. The discomfort should disappear within 24 hours.  If you had a scopolamine patch placed behind your ear for the management of post- operative nausea and/or vomiting:  1. The medication in the patch is effective for 72 hours, after which it should be removed.  Wrap patch in a tissue and discard in the trash. Wash hands thoroughly with soap and water. 2. You may remove the patch earlier than 72 hours if you experience unpleasant side effects which may include dry mouth, dizziness or visual disturbances. 3. Avoid touching the patch. Wash your hands with soap and water after contact with the patch.

## 2016-06-14 NOTE — Anesthesia Procedure Notes (Signed)
Procedure Name: Intubation Date/Time: 06/14/2016 7:51 AM Performed by: Marrianne Mood Pre-anesthesia Checklist: Patient identified, Emergency Drugs available, Suction available, Patient being monitored and Timeout performed Patient Re-evaluated:Patient Re-evaluated prior to inductionOxygen Delivery Method: Circle system utilized Preoxygenation: Pre-oxygenation with 100% oxygen Intubation Type: IV induction Ventilation: Mask ventilation without difficulty Tube type: Oral Laser Tube: Cuffed inflated with minimal occlusive pressure - saline and Laser Tube Tube size: 6.0 mm Number of attempts: 1 Airway Equipment and Method: Stylet and Oral airway Placement Confirmation: ETT inserted through vocal cords under direct vision,  positive ETCO2 and breath sounds checked- equal and bilateral Tube secured with: Tape Dental Injury: Teeth and Oropharynx as per pre-operative assessment

## 2016-06-14 NOTE — Transfer of Care (Signed)
Immediate Anesthesia Transfer of Care Note  Patient: [name]  Procedure(s) Performed: Procedure(s): REVISION OF TRACHEAL STOMA WITH CO2 LASER, CHANGE OF TRACHEAL ESOPHAGEAL PROSTHESIS, ESOPHAGOSCOPY WITH DILATION (N/A) CO2 LASER APPLICATION (N/A)  Patient Location: PACU  Anesthesia Type:General  Level of Consciousness: awake and patient cooperative  Airway & Oxygen Therapy: Patient Spontanous Breathing and Patient connected to tracheostomy mask oxygen  Post-op Assessment: Report given to RN and Post -op Vital signs reviewed and stable  Post vital signs: Reviewed and stable  Last Vitals:  Vitals:   06/14/16 0643  BP: (!) 141/74  Pulse: 74  Resp: 20  Temp: 36.6 C    Last Pain:  Vitals:   06/14/16 0643  TempSrc: Oral         Complications: No apparent anesthesia complications

## 2016-06-14 NOTE — Anesthesia Preprocedure Evaluation (Addendum)
Anesthesia Evaluation  Patient identified by MRN, date of birth, ID band Patient awake    Reviewed: Allergy & Precautions, H&P , NPO status , Patient's Chart, lab work & pertinent test results  Airway Mallampati: II  TM Distance: >3 FB Neck ROM: Full    Dental no notable dental hx. (+) Edentulous Upper, Edentulous Lower, Dental Advisory Given   Pulmonary neg pulmonary ROS, former smoker,  Trach due to laryngeal CA   Pulmonary exam normal breath sounds clear to auscultation       Cardiovascular hypertension, Pt. on medications  Rhythm:Regular Rate:Normal     Neuro/Psych Anxiety negative neurological ROS  negative psych ROS   GI/Hepatic Neg liver ROS, GERD  Medicated and Controlled,  Endo/Other  negative endocrine ROS  Renal/GU negative Renal ROS  negative genitourinary   Musculoskeletal   Abdominal   Peds  Hematology negative hematology ROS (+)   Anesthesia Other Findings   Reproductive/Obstetrics negative OB ROS                            Anesthesia Physical Anesthesia Plan  ASA: III  Anesthesia Plan: General   Post-op Pain Management:    Induction: Intravenous  Airway Management Planned: Tracheostomy  Additional Equipment:   Intra-op Plan:   Post-operative Plan:   Informed Consent: I have reviewed the patients History and Physical, chart, labs and discussed the procedure including the risks, benefits and alternatives for the proposed anesthesia with the patient or authorized representative who has indicated his/her understanding and acceptance.   Dental advisory given  Plan Discussed with: CRNA  Anesthesia Plan Comments:         Anesthesia Quick Evaluation

## 2016-06-17 ENCOUNTER — Encounter (HOSPITAL_BASED_OUTPATIENT_CLINIC_OR_DEPARTMENT_OTHER): Payer: Self-pay | Admitting: Otolaryngology

## 2016-06-17 NOTE — Op Note (Signed)
NAMEDEMYAN, FUGATE              ACCOUNT NO.:  [id number]  MEDICAL RECORD NO.:  [id number]  LOCATION:                                 FACILITY:  PHYSICIAN:  Leonides Sake. Lucia Gaskins, M.D.DATE OF BIRTH:  1930/11/12  DATE OF PROCEDURE:  06/14/2016 DATE OF DISCHARGE:                              OPERATIVE REPORT   PREOPERATIVE DIAGNOSIS:  Tracheal stenosis with esophageal stenosis.  POSTOPERATIVE DIAGNOSIS:  Tracheal stenosis with esophageal stenosis.  OPERATION PERFORMED:  Tracheal stoma revision with CO2 laser. Esophagoscopy with dilation with Savary dilators to 17 mm.  Exchange of tracheoesophageal prosthesis with Provox2 8 mm prosthesis.  SURGEON:  Leonides Sake. Lucia Gaskins, M.D.  ANESTHESIA:  General endotracheal.  COMPLICATIONS:  None.  BRIEF CLINICAL NOTE:  [name] is an 82 year old gentleman who is approximately 8 years status post laryngectomy.  He had a tracheoesophageal prosthesis in place for a number of years.  He has had problems with granulation tissue growing over the prosthesis and the last time the prosthesis was changed, he was changed to a #10 Provox2 prosthesis and when he uses 10, he has little bit more trouble swallowing and again the granulation tissue started to grow over the prosthesis.  He is taken to the operating room at this time for laser excision of the granulation tissue, which is going over the prosthesis and will be changed to an 8 mm prosthesis and have dilation of the cervical esophagus at the same time.  DESCRIPTION OF PROCEDURE:  After general endotracheal anesthesia through his laryngeal stoma, cervical esophagoscopy was performed.  I was unable to pass the cervical esophagoscope because of the scar tissue proximal to the fistula.  Starting with a #14 Savary dilators, this was passed, it was little bit tight to pass initially.  Subsequent dilation was performed up to 17 mm dilator.  At this time, the dilator was removed and the cervical  esophagoscope was able to be passed down to the prosthesis.  The prosthesis extended down into the esophagus a little bit excessively, there were some fungal elements on the prosthesis, but there was really only minimal redundant mucosa around the prosthesis. The prosthesis was removed and the esophagus was again dilated up to 17 mm Savary dilator.  With the dilator in place, the length of the hole of the tracheoesophageal puncture hole was measured and it measured around 8 mm.  Using the CO2 laser at 7, the excessive granulation tissue around the tracheoesophageal hole within the stoma was lasered.  There was minimal bleeding.  Hemostasis was obtained with cotton pledgets soaked in adrenaline.  After lasering all the excessive tissue around the TE fistula.  A new #8 mm Provox2 prosthesis was passed intraorally after passing a red rubber catheter.  This was passed up without any difficulty and positioned.  A small piece of gel film x2 was placed underneath the flanges of the prosthesis in the stoma.  Bacitracin ointment was placed around the stoma.  The patient was awoken from anesthesia and transferred to the recovery room postoperatively doing well.  DISPOSITION:  He is discharged home on amoxicillin suspension 5 mg b.i.d. for a week, Tylenol and Motrin p.r.n. pain.  I will have  him follow up office in 2 weeks for recheck.    ______________________________ Leonides Sake. Lucia Gaskins, M.D.   ______________________________ Leonides Sake. Lucia Gaskins, M.D.    CEN/MEDQ  D:  06/14/2016  T:  06/15/2016  Job:  [id number]

## 2016-06-18 ENCOUNTER — Ambulatory Visit (INDEPENDENT_AMBULATORY_CARE_PROVIDER_SITE_OTHER): Payer: Medicare Other

## 2016-06-18 DIAGNOSIS — Z23 Encounter for immunization: Secondary | ICD-10-CM

## 2016-07-03 ENCOUNTER — Telehealth: Payer: Self-pay

## 2016-07-03 NOTE — Telephone Encounter (Signed)
Please advise patient is being seen by speech pathologist at [facility]. They are needing a new order for the patient dated prior to 04/24/2016. The order needs to state why the patient is needing be seen by the speech pathologist.

## 2016-07-03 NOTE — Telephone Encounter (Signed)
Unfortunately, it was not I who referred pt to speech pathology, as I dont see any referral done since may 2017 at least  I think it may have come from Dr Lucia Gaskins, ENT?  OK to inform the speech pathology dept

## 2016-07-30 DIAGNOSIS — Z8546 Personal history of malignant neoplasm of prostate: Secondary | ICD-10-CM | POA: Diagnosis not present

## 2016-08-01 ENCOUNTER — Ambulatory Visit (INDEPENDENT_AMBULATORY_CARE_PROVIDER_SITE_OTHER): Payer: Medicare Other | Admitting: Internal Medicine

## 2016-08-01 VITALS — BP 132/60 | HR 91 | Temp 98.0°F | Resp 16 | Wt 207.0 lb

## 2016-08-01 DIAGNOSIS — I10 Essential (primary) hypertension: Secondary | ICD-10-CM | POA: Diagnosis not present

## 2016-08-01 DIAGNOSIS — R7302 Impaired glucose tolerance (oral): Secondary | ICD-10-CM

## 2016-08-01 DIAGNOSIS — E785 Hyperlipidemia, unspecified: Secondary | ICD-10-CM | POA: Diagnosis not present

## 2016-08-01 NOTE — Progress Notes (Signed)
Subjective:    Patient ID: [name], male    DOB: 1930/10/07, 82 y.o.   MRN: [id number]  HPI  Doing overall quite well, really feels he doesn't need to be here.  Pt denies chest pain, increased sob or doe, wheezing, orthopnea, PND, increased LE swelling, palpitations, dizziness or syncope.  Pt denies new neurological symptoms such as new headache, or facial or extremity weakness or numbness   Pt denies polydipsia, polyuria.  Dalmane no longer seems to be working well after taking many years, asks for change.   Pt denies fever, wt loss, night sweats, loss of appetite, or other constitutional symptoms Past Medical History:  Diagnosis Date  . Cataract, immature   . Esophageal stenosis 05/2016  . Full dentures   . GERD (gastroesophageal reflux disease)   . History of cancer of larynx 2009  . History of prostate cancer 2015  . History of radiation therapy 2015   for prostate cancer  . Hypertension    states under control with meds., has been on med. x 5-6 yr.  . 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 CO2 LASER APPLICATION N/A 03/31/9677   Procedure: CO2 LASER APPLICATION;  Surgeon: Rozetta Nunnery, MD;  Location: [facility];  Service: ENT;  Laterality: N/A;  . CO2 LASER APPLICATION N/A 9/38/1017   Procedure: CO2 LASER APPLICATION;  Surgeon: Rozetta Nunnery, MD;  Location: [facility];  Service: ENT;  Laterality: N/A;  . DIRECT LARYNGOSCOPY  04/10/2007; 02/23/2007   with bx.  . ESOPHAGEAL DILATION  06/24/2008; 12/04/2009; 12/06/2010; 01/10/2012  . ESOPHAGEAL DILATION N/A 10/12/2013   Procedure: ESOPHAGEAL DILATION;  Surgeon: Rozetta Nunnery, MD;  Location: [facility];  Service: ENT;  Laterality: N/A;  . ESOPHAGOSCOPY WITH DILITATION  11/29/2014   Procedure: ESOPHAGOSCOPY WITH DILITATION;  Surgeon: Rozetta Nunnery, MD;  Location: [facility];  Service: ENT;;  . LIPOMA  EXCISION     abd.   Marland Kitchen MASS EXCISION  01/10/2012   Procedure: EXCISION MASS;  Surgeon: Rozetta Nunnery, MD;  Location: [facility];  Service: ENT;  Laterality: N/A;  posterior neck mass  . NECK SURGERY  01/10/2012   tumor removed from back of neck  . PLACEMENT OF TRACHEAL ESOPHAGEAL PROTHESIS, ESOPHAGOSCOPY WITH DILATION  04/07/2008  . PLACEMENT OF TRACHEAL ESOPHAGEAL PROTHESIS, ESOPHAGOSCOPY WITH DILATION N/A 06/14/2016   Procedure: REVISION OF TRACHEAL STOMA WITH CO2 LASER, CHANGE OF TRACHEAL ESOPHAGEAL PROSTHESIS, ESOPHAGOSCOPY WITH DILATION;  Surgeon: Rozetta Nunnery, MD;  Location: [facility];  Service: ENT;  Laterality: N/A;  . PROSTATE BIOPSY  04/29/2005  . TONSILLECTOMY    . TOTAL LARYNGECTOMY  10/28/2007  . TRACHEAL ESOPHAGEAL PROSTHESIS (TEP) CHANGE  12/04/2009; 12/06/2010  . TRACHEOSTOMY REVISION N/A 10/12/2013   Procedure: TRACHEOSTOMA  REVSION w/ CO2 LASER;  Surgeon: Rozetta Nunnery, MD;  Location: [facility];  Service: ENT;  Laterality: N/A;  . TRACHEOSTOMY REVISION N/A 11/29/2014   Procedure: tracheal esophageal prosthesis change ;  Surgeon: Rozetta Nunnery, MD;  Location: [place] [facility];  Service: ENT;  Laterality: N/A;    reports that he quit smoking about 29 years ago. He quit after 25.00 years of use. He has never used smokeless tobacco. He reports that he does not drink alcohol or use drugs. family history includes Diabetes in his brother and brother; Heart attack (age of onset: 14) in his  father; Hypertension in his father. Allergies  Allergen Reactions  . Terazosin Other (See Comments)    DIZZINESS   Current Outpatient Prescriptions on File Prior to Visit  Medication Sig Dispense Refill  . amLODipine (NORVASC) 5 MG tablet Take 5 mg by mouth daily.    Marland Kitchen aspirin 81 MG tablet Take 81 mg by mouth daily.    . cholecalciferol (VITAMIN D) 1000 UNITS tablet Take 1,000 Units by mouth daily.    Marland Kitchen lisinopril  (PRINIVIL,ZESTRIL) 40 MG tablet Take 40 mg by mouth daily.    . ranitidine (ZANTAC) 150 MG capsule Take 150 mg by mouth every evening.      No current facility-administered medications on file prior to visit.    Review of Systems  Constitutional: Negative for unusual diaphoresis or night sweats HENT: Negative for ear swelling or discharge Eyes: Negative for worsening visual haziness  Respiratory: Negative for choking and stridor.   Gastrointestinal: Negative for distension or worsening eructation Genitourinary: Negative for retention or change in urine volume.  Musculoskeletal: Negative for other MSK pain or swelling Skin: Negative for color change and worsening wound Neurological: Negative for tremors and numbness other than noted  Psychiatric/Behavioral: Negative for decreased concentration or agitation other than above   All other system neg per pt    Objective:   Physical Exam BP 132/60   Pulse 91   Temp 98 F (36.7 C) (Oral)   Resp 16   Wt 207 lb (93.9 kg)   SpO2 97%   BMI 30.57 kg/m  VS noted,  Constitutional: Pt appears in no apparent distress HENT: Head: NCAT.  Right Ear: External ear normal.  Left Ear: External ear normal.  Eyes: . Pupils are equal, round, and reactive to light. Conjunctivae and EOM are normal Neck: Normal range of motion. Neck supple.  Cardiovascular: Normal rate and regular rhythm.   Pulmonary/Chest: Effort normal and breath sounds without rales or wheezing.  Abd:  Soft, NT, ND, + BS Neurological: [name] alert. Not confused , motor grossly intact Skin: Skin is warm. No rash, no LE edema Psychiatric: Pt behavior is normal. No agitation. mild nervous No other exam findings    Assessment & Plan:

## 2016-08-01 NOTE — Progress Notes (Signed)
Pre visit review using our clinic review tool, if applicable. No additional management support is needed unless otherwise documented below in the visit note. 

## 2016-08-01 NOTE — Patient Instructions (Signed)
Please continue all other medications as before, and refills have been done if requested.  Please have the pharmacy call with any other refills you may need.  Please continue your efforts at being more active, low cholesterol diet, and weight control.  You are otherwise up to date with prevention measures today.  Please keep your appointments with your specialists as you may have planned  Please return in 6 months, or sooner if needed 

## 2016-08-04 NOTE — Assessment & Plan Note (Signed)
stable overall by history and exam, recent data reviewed with pt, and pt to continue medical treatment as before,  to f/u any worsening symptoms or concerns, pt to forward recent [place] lab results but states controlled

## 2016-08-04 NOTE — Assessment & Plan Note (Signed)
Lab Results  Component Value Date   HGBA1C 5.8 04/18/2016   stable overall by history and exam, recent data reviewed with pt, and pt to continue medical treatment as before,  to f/u any worsening symptoms or concerns

## 2016-08-04 NOTE — Assessment & Plan Note (Signed)
stable overall by history and exam, recent data reviewed with pt, and pt to continue medical treatment as before,  to f/u any worsening symptoms or concerns BP Readings from Last 3 Encounters:  08/01/16 132/60  06/14/16 (!) 141/74  04/17/16 128/68

## 2016-08-05 DIAGNOSIS — R351 Nocturia: Secondary | ICD-10-CM | POA: Diagnosis not present

## 2016-08-05 DIAGNOSIS — N403 Nodular prostate with lower urinary tract symptoms: Secondary | ICD-10-CM | POA: Diagnosis not present

## 2016-08-05 DIAGNOSIS — Z8546 Personal history of malignant neoplasm of prostate: Secondary | ICD-10-CM | POA: Diagnosis not present

## 2016-10-17 DIAGNOSIS — H31012 Macula scars of posterior pole (postinflammatory) (post-traumatic), left eye: Secondary | ICD-10-CM | POA: Diagnosis not present

## 2016-10-17 DIAGNOSIS — H5203 Hypermetropia, bilateral: Secondary | ICD-10-CM | POA: Diagnosis not present

## 2016-10-17 DIAGNOSIS — H2513 Age-related nuclear cataract, bilateral: Secondary | ICD-10-CM | POA: Diagnosis not present

## 2016-10-17 DIAGNOSIS — H524 Presbyopia: Secondary | ICD-10-CM | POA: Diagnosis not present

## 2016-10-17 DIAGNOSIS — H52203 Unspecified astigmatism, bilateral: Secondary | ICD-10-CM | POA: Diagnosis not present

## 2016-11-03 ENCOUNTER — Encounter (HOSPITAL_COMMUNITY): Payer: Self-pay

## 2016-11-03 ENCOUNTER — Emergency Department (HOSPITAL_COMMUNITY)
Admission: EM | Admit: 2016-11-03 | Discharge: 2016-11-04 | Disposition: A | Discharge status: Home / Self Care | Payer: Medicare Other | Attending: Emergency Medicine | Admitting: Emergency Medicine

## 2016-11-03 DIAGNOSIS — I10 Essential (primary) hypertension: Secondary | ICD-10-CM | POA: Diagnosis not present

## 2016-11-03 DIAGNOSIS — R339 Retention of urine, unspecified: Secondary | ICD-10-CM

## 2016-11-03 DIAGNOSIS — Z8521 Personal history of malignant neoplasm of larynx: Secondary | ICD-10-CM | POA: Diagnosis not present

## 2016-11-03 DIAGNOSIS — Z87891 Personal history of nicotine dependence: Secondary | ICD-10-CM | POA: Diagnosis not present

## 2016-11-03 DIAGNOSIS — Z79899 Other long term (current) drug therapy: Secondary | ICD-10-CM | POA: Insufficient documentation

## 2016-11-03 DIAGNOSIS — Z8546 Personal history of malignant neoplasm of prostate: Secondary | ICD-10-CM | POA: Diagnosis not present

## 2016-11-03 DIAGNOSIS — Z7982 Long term (current) use of aspirin: Secondary | ICD-10-CM | POA: Diagnosis not present

## 2016-11-03 LAB — URINALYSIS, ROUTINE W REFLEX MICROSCOPIC
Bilirubin Urine: NEGATIVE
GLUCOSE, UA: NEGATIVE mg/dL
Hgb urine dipstick: NEGATIVE
Ketones, ur: NEGATIVE mg/dL
LEUKOCYTES UA: NEGATIVE
Nitrite: NEGATIVE
PROTEIN: NEGATIVE mg/dL
Specific Gravity, Urine: 1.011 (ref 1.005–1.030)
pH: 7 (ref 5.0–8.0)

## 2016-11-03 MED ORDER — LIDOCAINE HCL 2 % EX GEL
1.0000 "application " | Freq: Once | CUTANEOUS | Status: AC
  Administered 2016-11-04: 1 via TOPICAL
  Filled 2016-11-03: qty 11

## 2016-11-03 MED ORDER — TAMSULOSIN HCL 0.4 MG PO CAPS: 0.4000 mg | ORAL_CAPSULE | Freq: Every day | ORAL | 0 refills | Status: DC

## 2016-11-03 NOTE — ED Provider Notes (Signed)
[facility] DEPT Provider Note   CSN: [id number] Arrival date & time: 11/03/16  1801     History   Chief Complaint Chief Complaint  Patient presents with  . Urinary Retention    HPI [name] is a 83 y.o. male.  HPI  83 y.o. male with a hx of Prostate Cancer, presents to the Emergency Department today complaining of urinary retention. Last output around 0730. Notes suprapubic pain. Hx of retention in past that he states occurs every month. Seen by [name] of [facility]. Pt has not had indwelling foley Catheter in 7-8 years. Normally he eventually urinates at home, but not this time. Denies fevers. No N/V. No CP/SOB. No other symptoms noted.   Past Medical History:  Diagnosis Date  . Cataract, immature   . Esophageal stenosis 05/2016  . Full dentures   . GERD (gastroesophageal reflux disease)   . History of cancer of larynx 2009  . History of prostate cancer 2015  . History of radiation therapy 2015   for prostate cancer  . Hypertension    states under control with meds., has been on med. x 5-6 yr.  . 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 Dizziness 04/12/2016  . Back pain 01/20/2015  . Insomnia 01/20/2015  . Malignant neoplasm of prostate ([place]) 02/22/2014  . Hematochezia 12/10/2011  . Abdominal pain, other specified site 12/10/2011  . Laryngeal cancer ([place]) 11/10/2011  . Diarrhea 11/10/2011  . Impaired glucose tolerance   . Preventative health care 11/03/2011  . Hyperlipidemia 08/29/2008  . ANXIETY 08/29/2008  . Essential hypertension 08/29/2008  . ALLERGIC RHINITIS 08/29/2008  . DIVERTICULOSIS, COLON 08/29/2008  . PROSTATE CANCER, HX OF 08/29/2008  . GERD 08/20/2007  . BENIGN PROSTATIC HYPERTROPHY 08/20/2007    Past Surgical History:  Procedure Laterality Date  . CO2 LASER APPLICATION N/A 9/51/8841   Procedure: CO2 LASER APPLICATION;  Surgeon: Rozetta Nunnery, MD;  Location: [facility];  Service: ENT;  Laterality: N/A;  . CO2 LASER APPLICATION N/A 6/60/6301   Procedure: CO2 LASER APPLICATION;  Surgeon: Rozetta Nunnery, MD;  Location: [facility];  Service: ENT;  Laterality: N/A;  . DIRECT LARYNGOSCOPY  04/10/2007; 02/23/2007   with bx.  . ESOPHAGEAL DILATION  06/24/2008; 12/04/2009; 12/06/2010; 01/10/2012  . ESOPHAGEAL DILATION N/A 10/12/2013   Procedure: ESOPHAGEAL DILATION;  Surgeon: Rozetta Nunnery, MD;  Location: [facility];  Service: ENT;  Laterality: N/A;  . ESOPHAGOSCOPY WITH DILITATION  11/29/2014   Procedure: ESOPHAGOSCOPY WITH DILITATION;  Surgeon: Rozetta Nunnery, MD;  Location: [facility];  Service: ENT;;  . LIPOMA EXCISION     abd.   Marland Kitchen MASS EXCISION  01/10/2012   Procedure: EXCISION MASS;  Surgeon: Rozetta Nunnery, MD;  Location: [facility];  Service: ENT;  Laterality: N/A;  posterior neck mass  . NECK SURGERY  01/10/2012   tumor removed from back of neck  . PLACEMENT OF TRACHEAL ESOPHAGEAL PROTHESIS, ESOPHAGOSCOPY WITH DILATION  04/07/2008  . PLACEMENT OF TRACHEAL ESOPHAGEAL PROTHESIS, ESOPHAGOSCOPY WITH DILATION N/A 06/14/2016   Procedure: REVISION OF TRACHEAL STOMA WITH CO2 LASER, CHANGE OF TRACHEAL ESOPHAGEAL PROSTHESIS, ESOPHAGOSCOPY WITH DILATION;  Surgeon: Rozetta Nunnery, MD;  Location: [facility];  Service: ENT;  Laterality: N/A;  . PROSTATE BIOPSY  04/29/2005  . TONSILLECTOMY    . TOTAL LARYNGECTOMY  10/28/2007  . TRACHEAL ESOPHAGEAL PROSTHESIS (TEP)  CHANGE  12/04/2009; 12/06/2010  . TRACHEOSTOMY REVISION N/A 10/12/2013   Procedure: TRACHEOSTOMA  REVSION w/ CO2 LASER;  Surgeon: Rozetta Nunnery, MD;  Location: [facility];  Service: ENT;  Laterality: N/A;  . TRACHEOSTOMY REVISION N/A 11/29/2014   Procedure: tracheal esophageal prosthesis change ;  Surgeon: Rozetta Nunnery, MD;  Location: [facility];  Service: ENT;   Laterality: N/A;       Home Medications    Prior to Admission medications   Medication Sig Start Date End Date Taking? Authorizing Provider  amLODipine (NORVASC) 5 MG tablet Take 5 mg by mouth daily.    Historical Provider, MD  aspirin 81 MG tablet Take 81 mg by mouth daily.    Historical Provider, MD  cholecalciferol (VITAMIN D) 1000 UNITS tablet Take 1,000 Units by mouth daily.    Historical Provider, MD  lisinopril (PRINIVIL,ZESTRIL) 40 MG tablet Take 40 mg by mouth daily.    Historical Provider, MD  ranitidine (ZANTAC) 150 MG capsule Take 150 mg by mouth every evening.     Historical Provider, MD    Family History Family History  Problem Relation Age of Onset  . Hypertension Father   . Heart attack Father 71  . Diabetes Brother   . Diabetes Brother   . Colon cancer Neg Hx     Social History Social History  Substance Use Topics  . Smoking status: Former Smoker    Years: 25.00    Quit date: 01/07/1987  . Smokeless tobacco: Never Used  . Alcohol use No     Allergies   Terazosin   Review of Systems Review of Systems ROS reviewed and all are negative for acute change except as noted in the HPI.  Physical Exam Updated Vital Signs BP 174/96 (BP Location: Left Arm)   Pulse 103   Temp 98.5 F (36.9 C) (Oral)   Resp 15   Ht '5\' 9"'$  (1.753 m)   Wt 90.7 kg   SpO2 93%   BMI 29.53 kg/m   Physical Exam  Constitutional: He is oriented to person, place, and time. Vital signs are normal. He appears well-developed and well-nourished.  HENT:  Head: Normocephalic and atraumatic.  Right Ear: Hearing normal.  Left Ear: Hearing normal.  Eyes: Conjunctivae and EOM are normal. Pupils are equal, round, and reactive to light.  Neck: Normal range of motion. Neck supple.  Cardiovascular: Normal rate, regular rhythm, normal heart sounds and intact distal pulses.   Pulmonary/Chest: Effort normal and breath sounds normal.  Abdominal: Soft. Normal appearance and bowel sounds are  normal. There is tenderness in the suprapubic area. There is no rigidity, no rebound, no guarding, no CVA tenderness, no tenderness at McBurney's point and negative Murphy's sign.  Musculoskeletal: Normal range of motion.  Neurological: He is alert and oriented to person, place, and time.  Skin: Skin is warm and dry.  Psychiatric: He has a normal mood and affect. His speech is normal and behavior is normal. Thought content normal.  Nursing note and vitals reviewed.  ED Treatments / Results  Labs (all labs ordered are listed, but only abnormal results are displayed) Labs Reviewed  URINE CULTURE  URINALYSIS, ROUTINE W REFLEX MICROSCOPIC   EKG  EKG Interpretation None      Radiology No results found.  Procedures Procedures (including critical care time)  Medications Ordered in ED Medications - No data to display   Initial Impression / Assessment and Plan / ED Course  I have reviewed  the triage vital signs and the nursing notes.  Pertinent labs & imaging results that were available during my care of the patient were reviewed by me and considered in my medical decision making (see chart for details).  Final Clinical Impressions(s) / ED Diagnoses  {I have reviewed and evaluated the relevant laboratory values.   {I have reviewed the relevant previous healthcare records.  {I obtained HPI from historian.   ED Course:  Assessment: [name] a 85yM with hx Prostate Cancer who presents with urinary retention since 0730. Hx same. No fevers. No N/V. Notes pain suprapubically. On exam, pt in NAD. Nontoxic/nonseptic appearing. VSS. Afebrile. Lungs CTA. Heart RRR. Abdomen TTP supropubic. Tense. UA unremarkable. Noted dribbling. Bedside bladder scan with 364m urine. Placed foley catheter. Plan is to DC home with follow up to Urology. Given Rx Flomax. At time of discharge, Patient is in no acute distress. Vital Signs are stable. Patient is able to ambulate. Patient able to tolerate PO.    Disposition/Plan:  DC Home Additional Verbal discharge instructions given and discussed with patient.  Pt Instructed to f/u with Urology in the next week for evaluation and treatment of symptoms. Return precautions given Pt acknowledges and agrees with plan  Supervising Physician ALacretia Leigh MD  Final diagnoses:  Urinary retention    New Prescriptions New Prescriptions   No medications on file     TShary Decamp PA-C 11/03/16 2002    ALacretia Leigh MD 11/03/16 2344

## 2016-11-03 NOTE — ED Notes (Signed)
Gross blood noted to gauze around catheter and blood tinged urine in foley bag. Devona Konig, [place] and Zenia Resides, MD notified and at bedside.

## 2016-11-03 NOTE — ED Notes (Signed)
Saturated dressing removed from penis at catheter insertion site. New dressing applied and if no profuse bleeding pt may be discharged with leg bag.

## 2016-11-03 NOTE — ED Triage Notes (Signed)
Patient states he last urinated at 0730. Patient states he has a history of urinary retention and prostate cancer.

## 2016-11-03 NOTE — ED Notes (Signed)
BRIGHT RED BLOOD CLOTS FOUND AROUND THE CATHETER AT THE TIP OF THE PENIS. PT DENIES PAIN. PINK-TINGED URINE DRAINING INTO THE BAG. PA TYLER MADE AWARE.

## 2016-11-03 NOTE — ED Notes (Signed)
CATHETER IRRIGATED WITH 300CC OF NS. NO RESISTANCE, PAIN, OR BLOOD CLOTS FOUND. SMALL AMOUNT OF BRIGHT RED BLOOD LEAKING FROM AROUND THE CATHETER AS BEFORE, NO PAIN DURING THE PROCESS. STERILE GAUZE PLACED AROUND THE TIP OF THE PENIS. PA TYLER MADE AWARE.

## 2016-11-03 NOTE — Discharge Instructions (Signed)
Please read and follow all provided instructions.  Your diagnoses today include:  1. Urinary retention     Tests performed today include: Vital signs. See below for your results today.   Medications prescribed:  Take as prescribed   Home care instructions:  Follow any educational materials contained in this packet.  Follow-up instructions: Please follow-up with your Urologist for further evaluation of symptoms and treatment   Return instructions:  Please return to the Emergency Department if you do not get better, if you get worse, or new symptoms OR  - Fever (temperature greater than 101.69F)  - Bleeding that does not stop with holding pressure to the area    -Severe pain (please note that you may be more sore the day after your accident)  - Chest Pain  - Difficulty breathing  - Severe nausea or vomiting  - Inability to tolerate food and liquids  - Passing out  - Skin becoming red around your wounds  - Change in mental status (confusion or lethargy)  - New numbness or weakness    Please return if you have any other emergent concerns.  Additional Information:  Your vital signs today were: BP 174/96 (BP Location: Left Arm)    Pulse 103    Temp 98.5 F (36.9 C) (Oral)    Resp 15    Ht '5\' 9"'$  (1.753 m)    Wt 90.7 kg    SpO2 93%    BMI 29.53 kg/m  If your blood pressure (BP) was elevated above 135/85 this visit, please have this repeated by your doctor within one month. ---------------

## 2016-11-03 NOTE — ED Notes (Signed)
Bladder scan results: 385 mL

## 2016-11-04 DIAGNOSIS — R339 Retention of urine, unspecified: Secondary | ICD-10-CM | POA: Diagnosis not present

## 2016-11-04 DIAGNOSIS — R338 Other retention of urine: Secondary | ICD-10-CM | POA: Diagnosis not present

## 2016-11-04 DIAGNOSIS — Z8546 Personal history of malignant neoplasm of prostate: Secondary | ICD-10-CM | POA: Diagnosis not present

## 2016-11-04 DIAGNOSIS — N403 Nodular prostate with lower urinary tract symptoms: Secondary | ICD-10-CM | POA: Diagnosis not present

## 2016-11-05 LAB — URINE CULTURE

## 2016-11-11 DIAGNOSIS — R3914 Feeling of incomplete bladder emptying: Secondary | ICD-10-CM | POA: Diagnosis not present

## 2016-11-18 DIAGNOSIS — T85638A Leakage of other specified internal prosthetic devices, implants and grafts, initial encounter: Secondary | ICD-10-CM | POA: Diagnosis not present

## 2016-11-18 DIAGNOSIS — Z8521 Personal history of malignant neoplasm of larynx: Secondary | ICD-10-CM | POA: Diagnosis not present

## 2016-11-29 DIAGNOSIS — T85698A Other mechanical complication of other specified internal prosthetic devices, implants and grafts, initial encounter: Secondary | ICD-10-CM | POA: Diagnosis not present

## 2016-11-29 DIAGNOSIS — Z9002 Acquired absence of larynx: Secondary | ICD-10-CM | POA: Diagnosis not present

## 2016-12-25 ENCOUNTER — Telehealth: Payer: Self-pay | Admitting: Internal Medicine

## 2016-12-25 NOTE — Telephone Encounter (Signed)
Called patient to schedule awv. Patient stated that he would like to give office a call back to schedule wellness visit.

## 2017-01-29 ENCOUNTER — Ambulatory Visit (INDEPENDENT_AMBULATORY_CARE_PROVIDER_SITE_OTHER): Payer: Medicare Other | Admitting: Internal Medicine

## 2017-01-29 ENCOUNTER — Encounter: Payer: Self-pay | Admitting: Internal Medicine

## 2017-01-29 VITALS — BP 128/66 | HR 100 | Ht 69.0 in | Wt 208.0 lb

## 2017-01-29 DIAGNOSIS — E785 Hyperlipidemia, unspecified: Secondary | ICD-10-CM | POA: Diagnosis not present

## 2017-01-29 DIAGNOSIS — R7302 Impaired glucose tolerance (oral): Secondary | ICD-10-CM

## 2017-01-29 DIAGNOSIS — R7989 Other specified abnormal findings of blood chemistry: Secondary | ICD-10-CM | POA: Insufficient documentation

## 2017-01-29 DIAGNOSIS — R946 Abnormal results of thyroid function studies: Secondary | ICD-10-CM

## 2017-01-29 DIAGNOSIS — I10 Essential (primary) hypertension: Secondary | ICD-10-CM

## 2017-01-29 DIAGNOSIS — Z8546 Personal history of malignant neoplasm of prostate: Secondary | ICD-10-CM | POA: Diagnosis not present

## 2017-01-29 NOTE — Progress Notes (Signed)
Subjective:    Patient ID: [name], male    DOB: 28-Aug-1931, 83 y.o.   MRN: [id number]  HPI  Here for yearly f/u;  Overall doing ok;  Pt denies Chest pain, worsening SOB, DOE, wheezing, orthopnea, PND, worsening LE edema, palpitations, dizziness or syncope.  Pt denies neurological change such as new headache, facial or extremity weakness.  Pt denies polydipsia, polyuria, or low sugar symptoms. Pt states overall good compliance with treatment and medications, good tolerability, and has been trying to follow appropriate diet.  Pt denies worsening depressive symptoms, suicidal ideation or panic. No fever, night sweats, wt loss, loss of appetite, or other constitutional symptoms.  Pt states good ability with ADL's, has low fall risk, home safety reviewed and adequate, no other significant changes in hearing or vision, and occasionally active with exercise Brings Labs from the [place] 12/19/2016 - A1c 5.8, vit d 57.8, LDL 33, Glc 98, BUn/cr 11/1.0, normal lytes, TSH 12.6 (high), free t4 0.85 (normal), normal AST and ALT, and PSA 0.2. Denies hyper or hypo thyroid symptoms such as voice, skin or hair change, declines tx for low thyroid now, plans to get in touch with his [place] provider. Has gained several lbs with more calories to diet eating out -  Wt Readings from Last 3 Encounters:  01/29/17 208 lb (94.3 kg)  11/03/16 200 lb (90.7 kg)  08/01/16 207 lb (93.9 kg)   BP Readings from Last 3 Encounters:  01/29/17 128/66  11/04/16 145/83  08/01/16 132/60   Past Medical History:  Diagnosis Date  . Cataract, immature   . Esophageal stenosis 05/2016  . Full dentures   . GERD (gastroesophageal reflux disease)   . History of cancer of larynx 2009  . History of prostate cancer 2015  . History of radiation therapy 2015   for prostate cancer  . Hypertension    states under control with meds., has been on med. x 5-6 yr.  . 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 CO2 LASER APPLICATION N/A 5/88/3254   Procedure: CO2 LASER APPLICATION;  Surgeon: Rozetta Nunnery, MD;  Location: [place];  Service: ENT;  Laterality: N/A;  . CO2 LASER APPLICATION N/A 9/82/6415   Procedure: CO2 LASER APPLICATION;  Surgeon: Rozetta Nunnery, MD;  Location: [place];  Service: ENT;  Laterality: N/A;  . DIRECT LARYNGOSCOPY  04/10/2007; 02/23/2007   with bx.  . ESOPHAGEAL DILATION  06/24/2008; 12/04/2009; 12/06/2010; 01/10/2012  . ESOPHAGEAL DILATION N/A 10/12/2013   Procedure: ESOPHAGEAL DILATION;  Surgeon: Rozetta Nunnery, MD;  Location: [place];  Service: ENT;  Laterality: N/A;  . ESOPHAGOSCOPY WITH DILITATION  11/29/2014   Procedure: ESOPHAGOSCOPY WITH DILITATION;  Surgeon: Rozetta Nunnery, MD;  Location: [place];  Service: ENT;;  . LIPOMA EXCISION     abd.   Marland Kitchen MASS EXCISION  01/10/2012   Procedure: EXCISION MASS;  Surgeon: Rozetta Nunnery, MD;  Location: [place];  Service: ENT;  Laterality: N/A;  posterior neck mass  . NECK SURGERY  01/10/2012   tumor removed from back of neck  . PLACEMENT OF TRACHEAL ESOPHAGEAL PROTHESIS, ESOPHAGOSCOPY WITH DILATION  04/07/2008  . PLACEMENT OF TRACHEAL ESOPHAGEAL PROTHESIS, ESOPHAGOSCOPY WITH DILATION N/A 06/14/2016   Procedure: REVISION OF TRACHEAL STOMA WITH CO2 LASER, CHANGE OF TRACHEAL ESOPHAGEAL PROSTHESIS, ESOPHAGOSCOPY WITH DILATION;  Surgeon: Rozetta Nunnery, MD;  Location: [facility];  Service: ENT;  Laterality: N/A;  . PROSTATE BIOPSY  04/29/2005  . TONSILLECTOMY    . TOTAL LARYNGECTOMY  10/28/2007  . TRACHEAL ESOPHAGEAL PROSTHESIS (TEP) CHANGE  12/04/2009; 12/06/2010  . TRACHEOSTOMY REVISION N/A 10/12/2013   Procedure: TRACHEOSTOMA  REVSION w/ CO2 LASER;  Surgeon: Rozetta Nunnery, MD;  Location: [place];  Service: ENT;  Laterality: N/A;  . TRACHEOSTOMY REVISION N/A 11/29/2014    Procedure: tracheal esophageal prosthesis change ;  Surgeon: Rozetta Nunnery, MD;  Location: [place];  Service: ENT;  Laterality: N/A;    reports that he quit smoking about 30 years ago. He quit after 25.00 years of use. He has never used smokeless tobacco. He reports that he does not drink alcohol or use drugs. family history includes Diabetes in his brother and brother; Heart attack (age of onset: 61) in his father; Hypertension in his father. Allergies  Allergen Reactions  . Terazosin Other (See Comments)    DIZZINESS   Current Outpatient Prescriptions on File Prior to Visit  Medication Sig Dispense Refill  . amLODipine (NORVASC) 5 MG tablet Take 1 tablet by mouth daily.    Marland Kitchen aspirin 81 MG tablet Take 81 mg by mouth daily.    . cholecalciferol (VITAMIN D) 1000 UNITS tablet Take 1,000 Units by mouth daily.    Marland Kitchen lisinopril (PRINIVIL,ZESTRIL) 40 MG tablet Take 1 tablet by mouth daily.    . ranitidine (ZANTAC) 150 MG capsule Take 150 mg by mouth every evening.     . tamsulosin (FLOMAX) 0.4 MG CAPS capsule Take 1 capsule (0.4 mg total) by mouth daily. 30 capsule 0   No current facility-administered medications on file prior to visit.     Review of Systems Constitutional: Negative for other unusual diaphoresis, sweats, appetite or weight changes HENT: Negative for other worsening hearing loss, ear pain, facial swelling, mouth sores or neck stiffness.   Eyes: Negative for other worsening pain, redness or other visual disturbance.  Respiratory: Negative for other stridor or swelling Cardiovascular: Negative for other palpitations or other chest pain  Gastrointestinal: Negative for worsening diarrhea or loose stools, blood in stool, distention or other pain Genitourinary: Negative for hematuria, flank pain or other change in urine volume.  Musculoskeletal: Negative for myalgias or other joint swelling.  Skin: Negative for other color change, or other wound or worsening  drainage.  Neurological: Negative for other syncope or numbness. Hematological: Negative for other adenopathy or swelling Psychiatric/Behavioral: Negative for hallucinations, other worsening agitation, SI, self-injury, or new decreased concentration All other system neg per pt    Objective:   Physical Exam BP 128/66   Pulse 100   Ht '5\' 9"'$  (1.753 m)   Wt 208 lb (94.3 kg)   SpO2 99%   BMI 30.72 kg/m  VS noted, obese Constitutional: [name] oriented to person, place, and time. Appears well-developed and well-nourished, in no significant distress and comfortable Head: Normocephalic and atraumatic  Eyes: Conjunctivae and EOM are normal. Pupils are equal, round, and reactive to light Right Ear: External ear normal without discharge Left Ear: External ear normal without discharge Nose: Nose without discharge or deformity Mouth/Throat: Oropharynx is without other ulcerations and moist  Neck: Normal range of motion. Neck supple. No JVD present. No tracheal deviation present or significant neck LA or mass Cardiovascular: Normal rate, regular rhythm, normal heart sounds and intact distal pulses.   Pulmonary/Chest: WOB normal and breath sounds without rales or wheezing  Abdominal: Soft. Bowel  sounds are normal. NT. No HSM  Musculoskeletal: Normal range of motion. Exhibits no edema Lymphadenopathy: Has no other cervical adenopathy.  Neurological: [name] alert and oriented to person, place, and time. Pt has normal reflexes. No cranial nerve deficit. Motor grossly intact, Gait intact Skin: Skin is warm and dry. No rash noted or new ulcerations Psychiatric:  Has normal mood and affect. Behavior is normal without agitation No other exam findings  Lab Results  Component Value Date   WBC 6.2 04/12/2016   HGB 14.1 04/12/2016   HCT 41.8 04/12/2016   PLT 245.0 04/12/2016   GLUCOSE 142 (H) 04/12/2016   ALT 31 04/12/2016   AST 33 04/12/2016   NA 141 04/12/2016   K 3.6 04/12/2016   CL 106 04/12/2016    CREATININE 1.04 04/12/2016   BUN 10 04/12/2016   CO2 29 04/12/2016   INR 1.1 (H) 12/10/2011   HGBA1C 5.8 04/18/2016       Assessment & Plan:

## 2017-01-29 NOTE — Assessment & Plan Note (Signed)
stable overall by history and exam, recent data reviewed with pt, and pt to continue medical treatment as before,  to f/u any worsening symptoms or concerns  

## 2017-01-29 NOTE — Patient Instructions (Signed)
Please address the apparently new low thyroid condition with your [facility] provider  Please continue all other medications as before, and refills have been done if requested.  Please have the pharmacy call with any other refills you may need.  Please continue your efforts at being more active, low cholesterol diet, and weight control.  You are otherwise up to date with prevention measures today.  Please keep your appointments with your specialists as you may have planned  Please return in 6 months, or sooner if needed

## 2017-01-29 NOTE — Assessment & Plan Note (Signed)
>   12 with recent labs but apparently not addressed per [place] PCP; he declines thyroid med start at this time, and will f/u by email with his [place] provider

## 2017-01-29 NOTE — Assessment & Plan Note (Signed)
stable overall by history and exam, recent data reviewed with pt, and pt to continue medical treatment as before,  to f/u any worsening symptoms or concerns, needs cont'd attention to wt loss, diet, excercise

## 2017-02-05 DIAGNOSIS — Z8546 Personal history of malignant neoplasm of prostate: Secondary | ICD-10-CM | POA: Diagnosis not present

## 2017-02-05 DIAGNOSIS — R351 Nocturia: Secondary | ICD-10-CM | POA: Diagnosis not present

## 2017-02-05 DIAGNOSIS — N401 Enlarged prostate with lower urinary tract symptoms: Secondary | ICD-10-CM | POA: Diagnosis not present

## 2017-03-31 DIAGNOSIS — Z448 Encounter for fitting and adjustment of other external prosthetic devices: Secondary | ICD-10-CM | POA: Diagnosis not present

## 2017-06-10 ENCOUNTER — Ambulatory Visit (INDEPENDENT_AMBULATORY_CARE_PROVIDER_SITE_OTHER): Payer: Medicare Other | Admitting: *Deleted

## 2017-06-10 DIAGNOSIS — Z23 Encounter for immunization: Secondary | ICD-10-CM | POA: Diagnosis not present

## 2017-07-30 DIAGNOSIS — Z8546 Personal history of malignant neoplasm of prostate: Secondary | ICD-10-CM | POA: Diagnosis not present

## 2017-08-05 ENCOUNTER — Ambulatory Visit (INDEPENDENT_AMBULATORY_CARE_PROVIDER_SITE_OTHER): Payer: Medicare Other | Admitting: Internal Medicine

## 2017-08-05 ENCOUNTER — Encounter: Payer: Self-pay | Admitting: Internal Medicine

## 2017-08-05 ENCOUNTER — Other Ambulatory Visit (INDEPENDENT_AMBULATORY_CARE_PROVIDER_SITE_OTHER): Payer: Medicare Other

## 2017-08-05 ENCOUNTER — Other Ambulatory Visit: Payer: Self-pay | Admitting: Internal Medicine

## 2017-08-05 VITALS — BP 128/90 | HR 77 | Temp 97.8°F | Ht 69.0 in | Wt 214.0 lb

## 2017-08-05 DIAGNOSIS — E785 Hyperlipidemia, unspecified: Secondary | ICD-10-CM

## 2017-08-05 DIAGNOSIS — I10 Essential (primary) hypertension: Secondary | ICD-10-CM | POA: Diagnosis not present

## 2017-08-05 DIAGNOSIS — R7302 Impaired glucose tolerance (oral): Secondary | ICD-10-CM | POA: Diagnosis not present

## 2017-08-05 DIAGNOSIS — E039 Hypothyroidism, unspecified: Secondary | ICD-10-CM | POA: Insufficient documentation

## 2017-08-05 LAB — LIPID PANEL
CHOLESTEROL: 91 mg/dL (ref 0–200)
HDL: 46 mg/dL (ref >39.00)
LDL CALC: 30 mg/dL (ref 0–99)
NonHDL: 44.95
TRIGLYCERIDES: 77 mg/dL (ref 0.0–149.0)
Total CHOL/HDL Ratio: 2
VLDL: 15.4 mg/dL (ref 0.0–40.0)

## 2017-08-05 LAB — URINALYSIS, ROUTINE W REFLEX MICROSCOPIC
BILIRUBIN URINE: NEGATIVE
HGB URINE DIPSTICK: NEGATIVE
Ketones, ur: NEGATIVE
LEUKOCYTES UA: NEGATIVE
Nitrite: NEGATIVE
RBC / HPF: NONE SEEN (ref 0–?)
Specific Gravity, Urine: 1.01 (ref 1.000–1.030)
TOTAL PROTEIN, URINE-UPE24: NEGATIVE
URINE GLUCOSE: NEGATIVE
UROBILINOGEN UA: 0.2 (ref 0.0–1.0)
pH: 7 (ref 5.0–8.0)

## 2017-08-05 LAB — CBC WITH DIFFERENTIAL/PLATELET
Basophils Absolute: 0 10*3/uL (ref 0.0–0.1)
Basophils Relative: 0.7 % (ref 0.0–3.0)
EOS ABS: 0.1 10*3/uL (ref 0.0–0.7)
Eosinophils Relative: 2.2 % (ref 0.0–5.0)
HCT: 46.4 % (ref 39.0–52.0)
HEMOGLOBIN: 15.5 g/dL (ref 13.0–17.0)
LYMPHS PCT: 11.1 % — AB (ref 12.0–46.0)
Lymphs Abs: 0.7 10*3/uL (ref 0.7–4.0)
MCHC: 33.5 g/dL (ref 30.0–36.0)
MCV: 86.6 fl (ref 78.0–100.0)
MONO ABS: 0.7 10*3/uL (ref 0.1–1.0)
Monocytes Relative: 10 % (ref 3.0–12.0)
NEUTROS PCT: 76 % (ref 43.0–77.0)
Neutro Abs: 5.1 10*3/uL (ref 1.4–7.7)
Platelets: 235 10*3/uL (ref 150.0–400.0)
RBC: 5.36 Mil/uL (ref 4.22–5.81)
RDW: 13.9 % (ref 11.5–15.5)
WBC: 6.7 10*3/uL (ref 4.0–10.5)

## 2017-08-05 LAB — HEPATIC FUNCTION PANEL
ALBUMIN: 4.3 g/dL (ref 3.5–5.2)
ALK PHOS: 65 U/L (ref 39–117)
ALT: 49 U/L (ref 0–53)
AST: 49 U/L — ABNORMAL HIGH (ref 0–37)
Bilirubin, Direct: 0.1 mg/dL (ref 0.0–0.3)
TOTAL PROTEIN: 7.3 g/dL (ref 6.0–8.3)
Total Bilirubin: 0.4 mg/dL (ref 0.2–1.2)

## 2017-08-05 LAB — BASIC METABOLIC PANEL
BUN: 12 mg/dL (ref 6–23)
CHLORIDE: 103 meq/L (ref 96–112)
CO2: 30 mEq/L (ref 19–32)
CREATININE: 1.02 mg/dL (ref 0.40–1.50)
Calcium: 9.8 mg/dL (ref 8.4–10.5)
GFR: 89.36 mL/min (ref >60.00)
Glucose, Bld: 94 mg/dL (ref 70–99)
Potassium: 4 mEq/L (ref 3.5–5.1)
Sodium: 140 mEq/L (ref 135–145)

## 2017-08-05 LAB — TSH: TSH: 21.41 u[IU]/mL — ABNORMAL HIGH (ref 0.35–4.50)

## 2017-08-05 LAB — HEMOGLOBIN A1C: Hgb A1c MFr Bld: 5.8 % (ref 4.6–6.5)

## 2017-08-05 MED ORDER — LEVOTHYROXINE SODIUM 50 MCG PO TABS: 50.0000 ug | ORAL_TABLET | Freq: Every day | ORAL | 3 refills | Status: DC

## 2017-08-05 NOTE — Assessment & Plan Note (Signed)
stable overall by history and exam, recent data reviewed with pt, and pt to continue medical treatment as before,  to f/u any worsening symptoms or concerns BP Readings from Last 3 Encounters:  08/05/17 128/90  01/29/17 128/66  11/04/16 145/83

## 2017-08-05 NOTE — Patient Instructions (Signed)

## 2017-08-05 NOTE — Assessment & Plan Note (Signed)
Asympt,  Lab Results  Component Value Date   HGBA1C 5.8 04/18/2016  stable overall by history and exam, recent data reviewed with pt, and pt to continue medical treatment as before,  to f/u any worsening symptoms or concerns, for f/u a1c today

## 2017-08-05 NOTE — Progress Notes (Addendum)
Subjective:    Patient ID: [name], male    DOB: Mar 28, 1931, 83 y.o.   MRN: [id number]  HPI  Here for yearly f/u;  Overall doing ok;  Pt denies Chest pain, worsening SOB, DOE, wheezing, orthopnea, PND, worsening LE edema, palpitations, dizziness or syncope.  Pt denies neurological change such as new headache, facial or extremity weakness.  Pt denies polydipsia, polyuria, or low sugar symptoms. Pt states overall good compliance with treatment and medications, good tolerability, and has been trying to follow appropriate diet.  Pt denies worsening depressive symptoms, suicidal ideation or panic. No fever, night sweats, wt loss, loss of appetite, or other constitutional symptoms.  Pt states good ability with ADL's, has low fall risk, home safety reviewed and adequate, no other significant changes in hearing or vision, and not active with exercise.  Had PSA done with local urology last wk.  Also followed at [facility] but no recent labs  No new complaints or interval hx Past Medical History:  Diagnosis Date  . Cataract, immature   . Esophageal stenosis 05/2016  . Full dentures   . GERD (gastroesophageal reflux disease)   . History of cancer of larynx 2009  . History of prostate cancer 2015  . History of radiation therapy 2015   for prostate cancer  . Hypertension    states under control with meds., has been on med. x 5-6 yr.  . 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 CO2 LASER APPLICATION N/A 1/32/4401   Procedure: CO2 LASER APPLICATION;  Surgeon: Rozetta Nunnery, MD;  Location: [place];  Service: ENT;  Laterality: N/A;  . CO2 LASER APPLICATION N/A 0/27/2536   Procedure: CO2 LASER APPLICATION;  Surgeon: Rozetta Nunnery, MD;  Location: [place];  Service: ENT;  Laterality: N/A;  . DIRECT LARYNGOSCOPY  04/10/2007; 02/23/2007   with bx.  . ESOPHAGEAL DILATION  06/24/2008; 12/04/2009; 12/06/2010; 01/10/2012  .  ESOPHAGEAL DILATION N/A 10/12/2013   Procedure: ESOPHAGEAL DILATION;  Surgeon: Rozetta Nunnery, MD;  Location: [place];  Service: ENT;  Laterality: N/A;  . ESOPHAGOSCOPY WITH DILITATION  11/29/2014   Procedure: ESOPHAGOSCOPY WITH DILITATION;  Surgeon: Rozetta Nunnery, MD;  Location: [place];  Service: ENT;;  . LIPOMA EXCISION     abd.   Marland Kitchen MASS EXCISION  01/10/2012   Procedure: EXCISION MASS;  Surgeon: Rozetta Nunnery, MD;  Location: [place];  Service: ENT;  Laterality: N/A;  posterior neck mass  . NECK SURGERY  01/10/2012   tumor removed from back of neck  . PLACEMENT OF TRACHEAL ESOPHAGEAL PROTHESIS, ESOPHAGOSCOPY WITH DILATION  04/07/2008  . PLACEMENT OF TRACHEAL ESOPHAGEAL PROTHESIS, ESOPHAGOSCOPY WITH DILATION N/A 06/14/2016   Procedure: REVISION OF TRACHEAL STOMA WITH CO2 LASER, CHANGE OF TRACHEAL ESOPHAGEAL PROSTHESIS, ESOPHAGOSCOPY WITH DILATION;  Surgeon: Rozetta Nunnery, MD;  Location: [place];  Service: ENT;  Laterality: N/A;  . PROSTATE BIOPSY  04/29/2005  . TONSILLECTOMY    . TOTAL LARYNGECTOMY  10/28/2007  . TRACHEAL ESOPHAGEAL PROSTHESIS (TEP) CHANGE  12/04/2009; 12/06/2010  . TRACHEOSTOMY REVISION N/A 10/12/2013   Procedure: TRACHEOSTOMA  REVSION w/ CO2 LASER;  Surgeon: Rozetta Nunnery, MD;  Location: [place];  Service: ENT;  Laterality: N/A;  . TRACHEOSTOMY REVISION N/A 11/29/2014   Procedure: tracheal esophageal prosthesis change ;  Surgeon: Rozetta Nunnery, MD;  Location: [place];  Service: ENT;  Laterality: N/A;    reports that he quit smoking about 30 years ago. He quit after 25.00 years of use. he has never used smokeless tobacco. He reports that he does not drink alcohol or use drugs. family history includes Diabetes in his brother and brother; Heart attack (age of onset: 24) in his father; Hypertension in his father. Allergies  Allergen  Reactions  . Terazosin Other (See Comments)    DIZZINESS  . Levothyroxine Other (See Comments)   Current Outpatient Medications on File Prior to Visit  Medication Sig Dispense Refill  . amLODipine (NORVASC) 5 MG tablet Take 1 tablet by mouth daily.    Marland Kitchen aspirin 81 MG tablet Take 81 mg by mouth daily.    . cholecalciferol (VITAMIN D) 1000 UNITS tablet Take 1,000 Units by mouth daily.    Marland Kitchen lisinopril (PRINIVIL,ZESTRIL) 40 MG tablet Take 1 tablet by mouth daily.    Marland Kitchen loratadine (CLARITIN) 10 MG tablet Take 10 mg by mouth daily as needed for allergies.    . Multiple Vitamins-Minerals (MULTIVITAMIN ADULT PO) Take by mouth.    . pantoprazole (PROTONIX) 40 MG tablet Take 40 mg by mouth daily.    . ranitidine (ZANTAC) 150 MG capsule Take 150 mg by mouth every evening.     . tamsulosin (FLOMAX) 0.4 MG CAPS capsule Take 1 capsule (0.4 mg total) by mouth daily. 30 capsule 0  . Vitamin D, Ergocalciferol, (DRISDOL) 50000 units CAPS capsule Take 50,000 Units by mouth every 7 (seven) days.     No current facility-administered medications on file prior to visit.    Review of Systems Constitutional: Negative for other unusual diaphoresis, sweats, appetite or weight changes HENT: Negative for other worsening hearing loss, ear pain, facial swelling, mouth sores or neck stiffness.   Eyes: Negative for other worsening pain, redness or other visual disturbance.  Respiratory: Negative for other stridor or swelling Cardiovascular: Negative for other palpitations or other chest pain  Gastrointestinal: Negative for worsening diarrhea or loose stools, blood in stool, distention or other pain Genitourinary: Negative for hematuria, flank pain or other change in urine volume.  Musculoskeletal: Negative for myalgias or other joint swelling.  Skin: Negative for other color change, or other wound or worsening drainage.  Neurological: Negative for other syncope or numbness. Hematological: Negative for other adenopathy  or swelling Psychiatric/Behavioral: Negative for hallucinations, other worsening agitation, SI, self-injury, or new decreased concentration All other system neg per pt    Objective:   Physical Exam BP 128/90   Pulse 77   Temp 97.8 F (36.6 C) (Oral)   Ht 5\' 9"  (1.753 m)   Wt 214 lb (97.1 kg)   SpO2 99%   BMI 31.60 kg/m  VS noted,  Constitutional: [name] oriented to person, place, and time. Appears well-developed and well-nourished, in no significant distress and comfortable Head: Normocephalic and atraumatic  Eyes: Conjunctivae and EOM are normal. Pupils are equal, round, and reactive to light Right Ear: External ear normal without discharge Left Ear: External ear normal without discharge Nose: Nose without discharge or deformity Mouth/Throat: Oropharynx is without other ulcerations and moist  Neck: Normal range of motion. Neck supple. No JVD present. No tracheal deviation present or significant neck LA or mass Cardiovascular: Normal rate, regular rhythm, normal heart sounds and intact distal pulses.   Pulmonary/Chest: WOB normal and breath sounds without rales or wheezing  Abdominal: Soft. Bowel sounds are normal. NT. No HSM  Musculoskeletal: Normal range of motion. Exhibits no  edema Lymphadenopathy: Has no other cervical adenopathy.  Neurological: [name] alert and oriented to person, place, and time. Pt has normal reflexes. No cranial nerve deficit. Motor grossly intact, Gait intact Skin: Skin is warm and dry. No rash noted or new ulcerations Psychiatric:  Has normal mood and affect. Behavior is normal without agitation No other exam findings Lab Results  Component Value Date   WBC 6.2 04/12/2016   HGB 14.1 04/12/2016   HCT 41.8 04/12/2016   PLT 245.0 04/12/2016   GLUCOSE 142 (H) 04/12/2016   ALT 31 04/12/2016   AST 33 04/12/2016   NA 141 04/12/2016   K 3.6 04/12/2016   CL 106 04/12/2016   CREATININE 1.04 04/12/2016   BUN 10 04/12/2016   CO2 29 04/12/2016   INR 1.1 (H)  12/10/2011   HGBA1C 5.8 04/18/2016   ADD:  Pt started levothyroxine but had rash, so stopped.    Assessment & Plan:

## 2017-08-05 NOTE — Assessment & Plan Note (Signed)
stable overall by history and exam, recent data reviewed with pt, and pt to continue medical treatment as before,  to f/u any worsening symptoms or concerns, for f/u lipids today, low chol diet

## 2017-08-06 ENCOUNTER — Telehealth: Payer: Self-pay

## 2017-08-06 NOTE — Telephone Encounter (Signed)
Pt has reviewed results via MyChart.

## 2017-08-06 NOTE — Addendum Note (Signed)
Addended by: Biagio Borg on: 08/06/2017 01:04 PM   Modules accepted: Orders

## 2017-08-06 NOTE — Telephone Encounter (Signed)
-----   Message from Biagio Borg, MD sent at 08/05/2017  6:43 PM EST ----- Left message on MyChart, pt to cont same tx except  The test results show that your current treatment is OK, except there is evidence for a new low thyroid condition.  We should treat this with levothyroxine 50 mcg per day.  I will send a new prescription, and ask you to come to the lab in 4 weeks to repeat the thyroid blood tests.    Redmond Baseman to please inform pt, I will do rx and lab test orders

## 2017-08-13 DIAGNOSIS — R3912 Poor urinary stream: Secondary | ICD-10-CM | POA: Diagnosis not present

## 2017-08-13 DIAGNOSIS — Z8546 Personal history of malignant neoplasm of prostate: Secondary | ICD-10-CM | POA: Diagnosis not present

## 2017-08-13 DIAGNOSIS — N401 Enlarged prostate with lower urinary tract symptoms: Secondary | ICD-10-CM | POA: Diagnosis not present

## 2017-10-01 DIAGNOSIS — Z8521 Personal history of malignant neoplasm of larynx: Secondary | ICD-10-CM | POA: Diagnosis not present

## 2017-10-17 DIAGNOSIS — Z8521 Personal history of malignant neoplasm of larynx: Secondary | ICD-10-CM | POA: Diagnosis not present

## 2017-10-17 DIAGNOSIS — Z9002 Acquired absence of larynx: Secondary | ICD-10-CM | POA: Diagnosis not present

## 2017-10-17 DIAGNOSIS — Z4689 Encounter for fitting and adjustment of other specified devices: Secondary | ICD-10-CM | POA: Diagnosis not present

## 2017-10-20 DIAGNOSIS — H2513 Age-related nuclear cataract, bilateral: Secondary | ICD-10-CM | POA: Diagnosis not present

## 2017-10-20 DIAGNOSIS — H31012 Macula scars of posterior pole (postinflammatory) (post-traumatic), left eye: Secondary | ICD-10-CM | POA: Diagnosis not present

## 2017-10-20 DIAGNOSIS — H5203 Hypermetropia, bilateral: Secondary | ICD-10-CM | POA: Diagnosis not present

## 2017-10-20 DIAGNOSIS — H52203 Unspecified astigmatism, bilateral: Secondary | ICD-10-CM | POA: Diagnosis not present

## 2017-10-20 DIAGNOSIS — H524 Presbyopia: Secondary | ICD-10-CM | POA: Diagnosis not present

## 2018-01-13 DIAGNOSIS — Z8546 Personal history of malignant neoplasm of prostate: Secondary | ICD-10-CM | POA: Diagnosis not present

## 2018-01-21 DIAGNOSIS — Z8546 Personal history of malignant neoplasm of prostate: Secondary | ICD-10-CM | POA: Diagnosis not present

## 2018-01-21 DIAGNOSIS — R3914 Feeling of incomplete bladder emptying: Secondary | ICD-10-CM | POA: Diagnosis not present

## 2018-01-21 DIAGNOSIS — R351 Nocturia: Secondary | ICD-10-CM | POA: Diagnosis not present

## 2018-02-12 ENCOUNTER — Ambulatory Visit (INDEPENDENT_AMBULATORY_CARE_PROVIDER_SITE_OTHER): Payer: Medicare Other | Admitting: Internal Medicine

## 2018-02-12 ENCOUNTER — Other Ambulatory Visit (INDEPENDENT_AMBULATORY_CARE_PROVIDER_SITE_OTHER): Payer: Medicare Other

## 2018-02-12 ENCOUNTER — Encounter: Payer: Self-pay | Admitting: Internal Medicine

## 2018-02-12 VITALS — BP 136/86 | HR 87 | Temp 98.3°F | Ht 69.0 in | Wt 201.0 lb

## 2018-02-12 DIAGNOSIS — E785 Hyperlipidemia, unspecified: Secondary | ICD-10-CM | POA: Diagnosis not present

## 2018-02-12 DIAGNOSIS — E039 Hypothyroidism, unspecified: Secondary | ICD-10-CM

## 2018-02-12 DIAGNOSIS — M5416 Radiculopathy, lumbar region: Secondary | ICD-10-CM | POA: Diagnosis not present

## 2018-02-12 DIAGNOSIS — R7302 Impaired glucose tolerance (oral): Secondary | ICD-10-CM

## 2018-02-12 DIAGNOSIS — I10 Essential (primary) hypertension: Secondary | ICD-10-CM

## 2018-02-12 LAB — CBC WITH DIFFERENTIAL/PLATELET
BASOS ABS: 0.1 10*3/uL (ref 0.0–0.1)
Basophils Relative: 0.7 % (ref 0.0–3.0)
Eosinophils Absolute: 0.2 10*3/uL (ref 0.0–0.7)
Eosinophils Relative: 2.4 % (ref 0.0–5.0)
HCT: 40.7 % (ref 39.0–52.0)
Hemoglobin: 14.1 g/dL (ref 13.0–17.0)
Lymphocytes Relative: 7.1 % — ABNORMAL LOW (ref 12.0–46.0)
Lymphs Abs: 0.5 10*3/uL — ABNORMAL LOW (ref 0.7–4.0)
MCHC: 34.6 g/dL (ref 30.0–36.0)
MCV: 82.4 fl (ref 78.0–100.0)
MONO ABS: 0.6 10*3/uL (ref 0.1–1.0)
MONOS PCT: 8.8 % (ref 3.0–12.0)
Neutro Abs: 5.9 10*3/uL (ref 1.4–7.7)
Platelets: 323 10*3/uL (ref 150.0–400.0)
RBC: 4.94 Mil/uL (ref 4.22–5.81)
RDW: 13.7 % (ref 11.5–15.5)
WBC: 7.3 10*3/uL (ref 4.0–10.5)

## 2018-02-12 LAB — URINALYSIS, ROUTINE W REFLEX MICROSCOPIC
Bilirubin Urine: NEGATIVE
Hgb urine dipstick: NEGATIVE
KETONES UR: NEGATIVE
Leukocytes, UA: NEGATIVE
Nitrite: NEGATIVE
PH: 6 (ref 5.0–8.0)
RBC / HPF: NONE SEEN (ref 0–?)
SPECIFIC GRAVITY, URINE: 1.025 (ref 1.000–1.030)
Total Protein, Urine: NEGATIVE
URINE GLUCOSE: NEGATIVE
Urobilinogen, UA: 0.2 (ref 0.0–1.0)

## 2018-02-12 LAB — LIPID PANEL
CHOL/HDL RATIO: 2
Cholesterol: 91 mg/dL (ref 0–200)
HDL: 42.8 mg/dL (ref >39.00)
LDL Cholesterol: 37 mg/dL (ref 0–99)
NONHDL: 48.32
Triglycerides: 56 mg/dL (ref 0.0–149.0)
VLDL: 11.2 mg/dL (ref 0.0–40.0)

## 2018-02-12 LAB — HEPATIC FUNCTION PANEL
ALK PHOS: 80 U/L (ref 39–117)
ALT: 28 U/L (ref 0–53)
AST: 28 U/L (ref 0–37)
Albumin: 3.9 g/dL (ref 3.5–5.2)
BILIRUBIN DIRECT: 0.1 mg/dL (ref 0.0–0.3)
BILIRUBIN TOTAL: 0.5 mg/dL (ref 0.2–1.2)
Total Protein: 7.2 g/dL (ref 6.0–8.3)

## 2018-02-12 LAB — BASIC METABOLIC PANEL
BUN: 13 mg/dL (ref 6–23)
CO2: 27 meq/L (ref 19–32)
CREATININE: 0.93 mg/dL (ref 0.40–1.50)
Calcium: 9.3 mg/dL (ref 8.4–10.5)
Chloride: 104 mEq/L (ref 96–112)
GFR: 99.29 mL/min (ref >60.00)
GLUCOSE: 103 mg/dL — AB (ref 70–99)
Potassium: 4.2 mEq/L (ref 3.5–5.1)
Sodium: 142 mEq/L (ref 135–145)

## 2018-02-12 LAB — HEMOGLOBIN A1C: Hgb A1c MFr Bld: 6.1 % (ref 4.6–6.5)

## 2018-02-12 LAB — TSH: TSH: 23.82 u[IU]/mL — AB (ref 0.35–4.50)

## 2018-02-12 LAB — T4, FREE: FREE T4: 0.71 ng/dL (ref 0.60–1.60)

## 2018-02-12 MED ORDER — PREDNISONE 10 MG PO TABS: ORAL_TABLET | ORAL | 0 refills | Status: DC

## 2018-02-12 MED ORDER — TRAMADOL HCL 50 MG PO TABS: 50.0000 mg | ORAL_TABLET | Freq: Three times a day (TID) | ORAL | 1 refills | Status: DC | PRN

## 2018-02-12 MED ORDER — TIZANIDINE HCL 2 MG PO TABS: 2.0000 mg | ORAL_TABLET | Freq: Four times a day (QID) | ORAL | 1 refills | Status: DC | PRN

## 2018-02-12 NOTE — Patient Instructions (Addendum)
Please take all new medication as prescribed - the pain medication, prednisone, and muscle relaxer as needed  Please check your thyroid medication bottle at home and let us know what the name and dose are  Please continue all other medications as before, and refills have been done if requested.  Please have the pharmacy call with any other refills you may need.  Please continue your efforts at being more active, low cholesterol diet, and weight control.  You are otherwise up to date with prevention measures today.  Please keep your appointments with your specialists as you may have planned  You will be contacted regarding the referral for: MRI for the lower back  Please go to the LAB in the Basement (turn left off the elevator) for the tests to be done today  You will be contacted by phone if any changes need to be made immediately.  Otherwise, you will receive a letter about your results with an explanation, but please check with MyChart first.  Please remember to sign up for MyChart if you have not done so, as this will be important to you in the future with finding out test results, communicating by private email, and scheduling acute appointments online when needed.

## 2018-02-12 NOTE — Progress Notes (Signed)
Subjective:    Patient ID: [name], male    DOB: June 01, 1931, 84 y.o.   MRN: [id number]  HPI  Here for yearly f/u;  Overall doing ok;  Pt denies Chest pain, worsening SOB, DOE, wheezing, orthopnea, PND, worsening LE edema, palpitations, dizziness or syncope.  Pt denies neurological change such as new headache, facial or extremity weakness.  Pt denies polydipsia, polyuria, or low sugar symptoms. Pt states overall good compliance with treatment and medications, good tolerability, and has been trying to follow appropriate diet.  Pt denies worsening depressive symptoms, suicidal ideation or panic. No fever, night sweats, wt loss, loss of appetite, or other constitutional symptoms.  Pt states good ability with ADL's, has low fall risk, home safety reviewed and adequate, no other significant changes in hearing or vision, and only occasionally active with exercise. Pt continues to have  leftLBP x 3 mo with hard coughing without change in severity until last 1-2 wks has become worse pain without numbenss or weakness of the leg, radiatates to the left lateral lower back only, no bowel or bladder change, fever, wt loss,  worsening LE pain/numbness/weakness, gait change or falls, but hard to bend and sitting is worsning.  Lying down and standing are not too bad.  tylenol not helping enough.  Denies hyper or hypo thyroid symptoms such as voice, skin or hair change, but admits he did not take the levthyroxine 50 as per last lab in Nov 2018; instead had it rechecked at [facility] and prescribed the same thing (he thinks, has the bottle at home) but a lower dose, but unfortunately had a skin rash and stopped everything.   Past Medical History:  Diagnosis Date  . Cataract, immature   . Esophageal stenosis 05/2016  . Full dentures   . GERD (gastroesophageal reflux disease)   . History of cancer of larynx 2009  . History of prostate cancer 2015  . History of radiation therapy 2015   for prostate cancer  . Hypertension     states under control with meds., has been on med. x 5-6 yr.  . 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 CO2 LASER APPLICATION N/A 7/79/3903   Procedure: CO2 LASER APPLICATION;  Surgeon: Rozetta Nunnery, MD;  Location: [place];  Service: ENT;  Laterality: N/A;  . CO2 LASER APPLICATION N/A 0/05/2329   Procedure: CO2 LASER APPLICATION;  Surgeon: Rozetta Nunnery, MD;  Location: [place];  Service: ENT;  Laterality: N/A;  . DIRECT LARYNGOSCOPY  04/10/2007; 02/23/2007   with bx.  . ESOPHAGEAL DILATION  06/24/2008; 12/04/2009; 12/06/2010; 01/10/2012  . ESOPHAGEAL DILATION N/A 10/12/2013   Procedure: ESOPHAGEAL DILATION;  Surgeon: Rozetta Nunnery, MD;  Location: [place];  Service: ENT;  Laterality: N/A;  . ESOPHAGOSCOPY WITH DILITATION  11/29/2014   Procedure: ESOPHAGOSCOPY WITH DILITATION;  Surgeon: Rozetta Nunnery, MD;  Location: [place];  Service: ENT;;  . LIPOMA EXCISION     abd.   Marland Kitchen MASS EXCISION  01/10/2012   Procedure: EXCISION MASS;  Surgeon: Rozetta Nunnery, MD;  Location: [place];  Service: ENT;  Laterality: N/A;  posterior neck mass  . NECK SURGERY  01/10/2012   tumor removed from back of neck  . PLACEMENT OF TRACHEAL ESOPHAGEAL PROTHESIS, ESOPHAGOSCOPY WITH DILATION  04/07/2008  . PLACEMENT OF TRACHEAL ESOPHAGEAL PROTHESIS, ESOPHAGOSCOPY WITH DILATION N/A 06/14/2016   Procedure: REVISION OF TRACHEAL STOMA  WITH CO2 LASER, CHANGE OF TRACHEAL ESOPHAGEAL PROSTHESIS, ESOPHAGOSCOPY WITH DILATION;  Surgeon: Rozetta Nunnery, MD;  Location: [place];  Service: ENT;  Laterality: N/A;  . PROSTATE BIOPSY  04/29/2005  . TONSILLECTOMY    . TOTAL LARYNGECTOMY  10/28/2007  . TRACHEAL ESOPHAGEAL PROSTHESIS (TEP) CHANGE  12/04/2009; 12/06/2010  . TRACHEOSTOMY REVISION N/A 10/12/2013   Procedure: TRACHEOSTOMA  REVSION w/ CO2 LASER;   Surgeon: Rozetta Nunnery, MD;  Location: [place];  Service: ENT;  Laterality: N/A;  . TRACHEOSTOMY REVISION N/A 11/29/2014   Procedure: tracheal esophageal prosthesis change ;  Surgeon: Rozetta Nunnery, MD;  Location: [place];  Service: ENT;  Laterality: N/A;    reports that he quit smoking about 31 years ago. He quit after 25.00 years of use. He has never used smokeless tobacco. He reports that he does not drink alcohol or use drugs. family history includes Diabetes in his brother and brother; Heart attack (age of onset: 69) in his father; Hypertension in his father. Allergies  Allergen Reactions  . Terazosin Other (See Comments)    DIZZINESS  . Levothyroxine Other (See Comments)   Current Outpatient Medications on File Prior to Visit  Medication Sig Dispense Refill  . amLODipine (NORVASC) 5 MG tablet Take 1 tablet by mouth daily.    Marland Kitchen aspirin 81 MG tablet Take 81 mg by mouth daily.    . cholecalciferol (VITAMIN D) 1000 UNITS tablet Take 1,000 Units by mouth daily.    Marland Kitchen lisinopril (PRINIVIL,ZESTRIL) 40 MG tablet Take 1 tablet by mouth daily.    Marland Kitchen loratadine (CLARITIN) 10 MG tablet Take 10 mg by mouth daily as needed for allergies.    . Multiple Vitamins-Minerals (MULTIVITAMIN ADULT PO) Take by mouth.    . pantoprazole (PROTONIX) 40 MG tablet Take 40 mg by mouth daily.    . ranitidine (ZANTAC) 150 MG capsule Take 150 mg by mouth every evening.     . tamsulosin (FLOMAX) 0.4 MG CAPS capsule Take 1 capsule (0.4 mg total) by mouth daily. 30 capsule 0  . Vitamin D, Ergocalciferol, (DRISDOL) 50000 units CAPS capsule Take 50,000 Units by mouth every 7 (seven) days.     No current facility-administered medications on file prior to visit.    Review of Systems Constitutional: Negative for other unusual diaphoresis, sweats, appetite or weight changes HENT: Negative for other worsening hearing loss, ear pain, facial swelling, mouth sores or neck  stiffness.   Eyes: Negative for other worsening pain, redness or other visual disturbance.  Respiratory: Negative for other stridor or swelling Cardiovascular: Negative for other palpitations or other chest pain  Gastrointestinal: Negative for worsening diarrhea or loose stools, blood in stool, distention or other pain Genitourinary: Negative for hematuria, flank pain or other change in urine volume.  Musculoskeletal: Negative for myalgias or other joint swelling.  Skin: Negative for other color change, or other wound or worsening drainage.  Neurological: Negative for other syncope or numbness. Hematological: Negative for other adenopathy or swelling Psychiatric/Behavioral: Negative for hallucinations, other worsening agitation, SI, self-injury, or new decreased concentration All other system neg per pt    Objective:   Physical Exam BP 136/86   Pulse 87   Temp 98.3 F (36.8 C) (Oral)   Ht 5\' 9"  (1.753 m)   Wt 201 lb (91.2 kg)   SpO2 92%   BMI 29.68 kg/m  VS noted,  Constitutional: [name] oriented to person, place, and time. Appears well-developed and well-nourished,  in no significant distress and comfortable Head: Normocephalic and atraumatic  Eyes: Conjunctivae and EOM are normal. Pupils are equal, round, and reactive to light Right Ear: External ear normal without discharge Left Ear: External ear normal without discharge Nose: Nose without discharge or deformity Mouth/Throat: Oropharynx is without other ulcerations and moist  Neck: Normal range of motion. Neck supple. No JVD present. No tracheal deviation present or significant neck LA or mass Cardiovascular: Normal rate, regular rhythm, normal heart sounds and intact distal pulses.   Pulmonary/Chest: WOB normal and breath sounds without rales or wheezing  Abdominal: Soft. Bowel sounds are normal. NT. No HSM  Musculoskeletal: Normal range of motion. Exhibits no edema Lymphadenopathy: Has no other cervical adenopathy.  Spine  nontender in midline, nor paravertebral  - "the pain is deep" he states Neurological: [name] alert and oriented to person, place, and time. Pt has normal reflexes. No cranial nerve deficit. Motor grossly intact, Gait intact Skin: Skin is warm and dry. No rash noted or new ulcerations Psychiatric:  Has normal mood and affect. Behavior is normal without agitation No other exam findings Lab Results  Component Value Date   WBC 6.7 08/05/2017   HGB 15.5 08/05/2017   HCT 46.4 08/05/2017   PLT 235.0 08/05/2017   GLUCOSE 94 08/05/2017   CHOL 91 08/05/2017   TRIG 77.0 08/05/2017   HDL 46.00 08/05/2017   LDLCALC 30 08/05/2017   ALT 49 08/05/2017   AST 49 (H) 08/05/2017   NA 140 08/05/2017   K 4.0 08/05/2017   CL 103 08/05/2017   CREATININE 1.02 08/05/2017   BUN 12 08/05/2017   CO2 30 08/05/2017   TSH 21.41 (H) 08/05/2017   INR 1.1 (H) 12/10/2011   HGBA1C 5.8 08/05/2017   P   Assessment & Plan:

## 2018-02-13 ENCOUNTER — Other Ambulatory Visit: Payer: Self-pay | Admitting: Internal Medicine

## 2018-02-13 DIAGNOSIS — E039 Hypothyroidism, unspecified: Secondary | ICD-10-CM

## 2018-02-13 NOTE — Telephone Encounter (Signed)
Ok to contact pt  I see today where he sent me information in hardcopy verifying he was actually taking the new levothyroxine sodium (synthroid) from the [place] when he subsequently had a rash that seemed associated  Levothyroxine to be placed on allergy list  Also I would like to refer to Endocrinology to discuss other option for treatment, as allergy to synthroid is very unusual

## 2018-02-14 NOTE — Assessment & Plan Note (Addendum)
Mod with neuro change,  for pain control, muscle relaxer prn, prednisone asd, MRI LS spine,  to f/u any worsening symptoms or concerns  Note:  Total time for pt hx, exam, review of record with pt in the room, determination of diagnoses and plan for further eval and tx is > 40 min, with over 50% spent in coordination and counseling of patient including the differential dx, tx, further evaluation and other management of left lumbar radiculopathy, hyperglycemia, hypothyroid, HLD, HTN

## 2018-02-14 NOTE — Assessment & Plan Note (Signed)
Lab Results  Component Value Date   LDLCALC 37 02/12/2018  stable overall by history and exam, recent data reviewed with pt, and pt to continue medical treatment as before,  to f/u any worsening symptoms or concerns

## 2018-02-14 NOTE — Assessment & Plan Note (Signed)
BP Readings from Last 3 Encounters:  02/12/18 136/86  08/05/17 128/90  01/29/17 128/66  stable overall by history and exam, recent data reviewed with pt, and pt to continue medical treatment as before,  to f/u any worsening symptoms or concerns

## 2018-02-14 NOTE — Assessment & Plan Note (Signed)
stable overall by history and exam, and pt to continue medical treatment as before,  to f/u any worsening symptoms or concerns, for f/u lab today

## 2018-02-14 NOTE — Assessment & Plan Note (Signed)
stable overall by history and exam, recent data reviewed with pt, and pt to continue medical treatment as before,  to f/u any worsening symptoms or concerns ' Lab Results  Component Value Date   HGBA1C 6.1 02/12/2018

## 2018-02-19 ENCOUNTER — Other Ambulatory Visit: Payer: Self-pay | Admitting: Internal Medicine

## 2018-02-19 ENCOUNTER — Encounter: Payer: Self-pay | Admitting: Internal Medicine

## 2018-02-19 MED ORDER — HYDROCODONE-ACETAMINOPHEN 7.5-325 MG PO TABS: 1.0000 | ORAL_TABLET | Freq: Four times a day (QID) | ORAL | 0 refills | Status: DC | PRN

## 2018-02-26 ENCOUNTER — Ambulatory Visit
Admission: RE | Admit: 2018-02-26 | Discharge: 2018-02-26 | Disposition: A | Discharge status: Home / Self Care | Payer: Medicare Other | Source: Ambulatory Visit | Attending: Internal Medicine | Admitting: Internal Medicine

## 2018-02-26 ENCOUNTER — Telehealth: Payer: Self-pay

## 2018-02-26 ENCOUNTER — Other Ambulatory Visit: Payer: Self-pay | Admitting: Internal Medicine

## 2018-02-26 DIAGNOSIS — M5416 Radiculopathy, lumbar region: Secondary | ICD-10-CM

## 2018-02-26 DIAGNOSIS — C61 Malignant neoplasm of prostate: Secondary | ICD-10-CM

## 2018-02-26 DIAGNOSIS — C7951 Secondary malignant neoplasm of bone: Principal | ICD-10-CM

## 2018-02-26 DIAGNOSIS — M545 Low back pain: Secondary | ICD-10-CM | POA: Diagnosis not present

## 2018-02-26 NOTE — Telephone Encounter (Signed)
[name] with [facility] calling with call report MR Lumbar Spine. First impression is in chart.

## 2018-02-27 ENCOUNTER — Encounter: Payer: Self-pay | Admitting: Internal Medicine

## 2018-02-27 ENCOUNTER — Telehealth: Payer: Self-pay

## 2018-02-27 MED ORDER — HYDROCODONE-ACETAMINOPHEN 7.5-325 MG/15ML PO SOLN: 15.0000 mL | Freq: Four times a day (QID) | ORAL | 0 refills | Status: DC | PRN

## 2018-02-27 NOTE — Telephone Encounter (Signed)
Pt has viewed results via MyChart  

## 2018-02-27 NOTE — Telephone Encounter (Signed)
-----   Message from Biagio Borg, MD sent at 02/26/2018  3:34 PM EDT ----- Left message on MyChart, pt to cont same tx except  The test results show that your current treatment is OK, except although the bone scan per Dr Jeffie Pollock was negative in 2015, it appears you may have some prostate cancer return in the bones, as there are several spots on the MRI.  There is no other significant problem  We need to make sure you get back to Dr Jeffie Pollock or a urologist of your choice  I will ask the office to call.Redmond Baseman to please inform pt, I will refer back to Dr Jeffie Pollock, but this can be changed if he wants outside of [place]

## 2018-03-02 ENCOUNTER — Encounter: Payer: Self-pay | Admitting: Internal Medicine

## 2018-03-06 ENCOUNTER — Other Ambulatory Visit: Payer: Self-pay | Admitting: Internal Medicine

## 2018-03-06 DIAGNOSIS — C7951 Secondary malignant neoplasm of bone: Secondary | ICD-10-CM

## 2018-03-10 ENCOUNTER — Encounter: Payer: Self-pay | Admitting: Internal Medicine

## 2018-03-10 ENCOUNTER — Ambulatory Visit (INDEPENDENT_AMBULATORY_CARE_PROVIDER_SITE_OTHER): Payer: Medicare Other | Admitting: Internal Medicine

## 2018-03-10 ENCOUNTER — Other Ambulatory Visit (INDEPENDENT_AMBULATORY_CARE_PROVIDER_SITE_OTHER): Payer: Medicare Other

## 2018-03-10 VITALS — BP 126/74 | HR 105 | Temp 98.0°F | Ht 69.0 in | Wt 194.0 lb

## 2018-03-10 DIAGNOSIS — R7302 Impaired glucose tolerance (oral): Secondary | ICD-10-CM | POA: Diagnosis not present

## 2018-03-10 DIAGNOSIS — E785 Hyperlipidemia, unspecified: Secondary | ICD-10-CM | POA: Diagnosis not present

## 2018-03-10 DIAGNOSIS — C7951 Secondary malignant neoplasm of bone: Secondary | ICD-10-CM

## 2018-03-10 DIAGNOSIS — E039 Hypothyroidism, unspecified: Secondary | ICD-10-CM

## 2018-03-10 DIAGNOSIS — Z8546 Personal history of malignant neoplasm of prostate: Secondary | ICD-10-CM | POA: Diagnosis not present

## 2018-03-10 DIAGNOSIS — I10 Essential (primary) hypertension: Secondary | ICD-10-CM | POA: Diagnosis not present

## 2018-03-10 LAB — BASIC METABOLIC PANEL
BUN: 16 mg/dL (ref 6–23)
CALCIUM: 9.5 mg/dL (ref 8.4–10.5)
CHLORIDE: 103 meq/L (ref 96–112)
CO2: 29 mEq/L (ref 19–32)
Creatinine, Ser: 0.89 mg/dL (ref 0.40–1.50)
GFR: 104.44 mL/min (ref >60.00)
Glucose, Bld: 124 mg/dL — ABNORMAL HIGH (ref 70–99)
Potassium: 3.6 mEq/L (ref 3.5–5.1)
SODIUM: 142 meq/L (ref 135–145)

## 2018-03-10 LAB — URINALYSIS, ROUTINE W REFLEX MICROSCOPIC
Hgb urine dipstick: NEGATIVE
KETONES UR: NEGATIVE
LEUKOCYTES UA: NEGATIVE
Nitrite: NEGATIVE
PH: 5 (ref 5.0–8.0)
RBC / HPF: NONE SEEN (ref 0–?)
Specific Gravity, Urine: >=1.03 — AB (ref 1.000–1.030)
Urine Glucose: NEGATIVE
Urobilinogen, UA: 1 (ref 0.0–1.0)

## 2018-03-10 LAB — CBC WITH DIFFERENTIAL/PLATELET
BASOS ABS: 0 10*3/uL (ref 0.0–0.1)
BASOS PCT: 0.4 % (ref 0.0–3.0)
EOS PCT: 0.9 % (ref 0.0–5.0)
Eosinophils Absolute: 0.1 10*3/uL (ref 0.0–0.7)
HEMATOCRIT: 36.7 % — AB (ref 39.0–52.0)
Hemoglobin: 12.1 g/dL — ABNORMAL LOW (ref 13.0–17.0)
Lymphs Abs: 0.5 10*3/uL — ABNORMAL LOW (ref 0.7–4.0)
MCHC: 32.9 g/dL (ref 30.0–36.0)
MCV: 82.4 fl (ref 78.0–100.0)
MONOS PCT: 10.5 % (ref 3.0–12.0)
Monocytes Absolute: 1.1 10*3/uL — ABNORMAL HIGH (ref 0.1–1.0)
Neutro Abs: 8.7 10*3/uL — ABNORMAL HIGH (ref 1.4–7.7)
Neutrophils Relative %: 83.5 % — ABNORMAL HIGH (ref 43.0–77.0)
PLATELETS: 420 10*3/uL — AB (ref 150.0–400.0)
RBC: 4.45 Mil/uL (ref 4.22–5.81)
RDW: 13.5 % (ref 11.5–15.5)
WBC: 10.4 10*3/uL (ref 4.0–10.5)

## 2018-03-10 LAB — LIPID PANEL
CHOLESTEROL: 86 mg/dL (ref 0–200)
HDL: 38.2 mg/dL — ABNORMAL LOW (ref >39.00)
LDL CALC: 33 mg/dL (ref 0–99)
NonHDL: 47.5
TRIGLYCERIDES: 71 mg/dL (ref 0.0–149.0)
Total CHOL/HDL Ratio: 2
VLDL: 14.2 mg/dL (ref 0.0–40.0)

## 2018-03-10 LAB — HEPATIC FUNCTION PANEL
ALBUMIN: 3.6 g/dL (ref 3.5–5.2)
ALK PHOS: 101 U/L (ref 39–117)
ALT: 18 U/L (ref 0–53)
AST: 19 U/L (ref 0–37)
Bilirubin, Direct: 0.1 mg/dL (ref 0.0–0.3)
TOTAL PROTEIN: 6.9 g/dL (ref 6.0–8.3)
Total Bilirubin: 0.5 mg/dL (ref 0.2–1.2)

## 2018-03-10 LAB — T4, FREE: Free T4: 0.86 ng/dL (ref 0.60–1.60)

## 2018-03-10 LAB — TSH: TSH: 12.97 u[IU]/mL — ABNORMAL HIGH (ref 0.35–4.50)

## 2018-03-10 LAB — HEMOGLOBIN A1C: Hgb A1c MFr Bld: 6.3 % (ref 4.6–6.5)

## 2018-03-10 MED ORDER — HYDROCODONE-ACETAMINOPHEN 7.5-325 MG PO TABS: 1.0000 | ORAL_TABLET | Freq: Four times a day (QID) | ORAL | 0 refills | Status: DC | PRN

## 2018-03-10 NOTE — Assessment & Plan Note (Signed)
stable overall by history and exam, recent data reviewed with pt, and pt to continue medical treatment as before,  to f/u any worsening symptoms or concerns BP Readings from Last 3 Encounters:  03/10/18 126/74  02/12/18 136/86  08/05/17 128/90

## 2018-03-10 NOTE — Progress Notes (Signed)
Subjective:    Patient ID: [name], male    DOB: July 09, 1931, 84 y.o.   MRN: [id number]  HPI  Here to f/u LBP, overall pain controlled, but does not tolerate well the hydrocodone soln that he requested, would like to go back to pills.  Pt continues to have recurring LBP without change in severity, bowel or bladder change, fever, wt loss,  worsening LE pain/numbness/weakness, gait change or falls, but had recent MRI with multiple bony mets.  Has seen urology, but PSA has been trending down, and not felt to likely be related to bony mets.  Pt denies chest pain, increased sob or doe, wheezing, orthopnea, PND, increased LE swelling, palpitations, dizziness or syncope.  Pt denies new neurological symptoms such as new headache, or facial or extremity weakness or numbness   Pt denies polydipsia, polyuria.   Pt denies fever, night sweats, loss of appetite, or other constitutional symptoms, but has lost wt recent Wt Readings from Last 3 Encounters:  03/10/18 194 lb (88 kg)  02/12/18 201 lb (91.2 kg)  08/05/17 214 lb (97.1 kg)   Past Medical History:  Diagnosis Date  . Cataract, immature   . Esophageal stenosis 05/2016  . Full dentures   . GERD (gastroesophageal reflux disease)   . History of cancer of larynx 2009  . History of prostate cancer 2015  . History of radiation therapy 2015   for prostate cancer  . Hypertension    states under control with meds., has been on med. x 5-6 yr.  . 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 CO2 LASER APPLICATION N/A 03/25/6268   Procedure: CO2 LASER APPLICATION;  Surgeon: Rozetta Nunnery, MD;  Location: [place];  Service: ENT;  Laterality: N/A;  . CO2 LASER APPLICATION N/A 4/85/4627   Procedure: CO2 LASER APPLICATION;  Surgeon: Rozetta Nunnery, MD;  Location: [place];  Service: ENT;  Laterality: N/A;  . DIRECT LARYNGOSCOPY  04/10/2007; 02/23/2007   with bx.  .  ESOPHAGEAL DILATION  06/24/2008; 12/04/2009; 12/06/2010; 01/10/2012  . ESOPHAGEAL DILATION N/A 10/12/2013   Procedure: ESOPHAGEAL DILATION;  Surgeon: Rozetta Nunnery, MD;  Location: [place];  Service: ENT;  Laterality: N/A;  . ESOPHAGOSCOPY WITH DILITATION  11/29/2014   Procedure: ESOPHAGOSCOPY WITH DILITATION;  Surgeon: Rozetta Nunnery, MD;  Location: [place];  Service: ENT;;  . LIPOMA EXCISION     abd.   Marland Kitchen MASS EXCISION  01/10/2012   Procedure: EXCISION MASS;  Surgeon: Rozetta Nunnery, MD;  Location: [place];  Service: ENT;  Laterality: N/A;  posterior neck mass  . NECK SURGERY  01/10/2012   tumor removed from back of neck  . PLACEMENT OF TRACHEAL ESOPHAGEAL PROTHESIS, ESOPHAGOSCOPY WITH DILATION  04/07/2008  . PLACEMENT OF TRACHEAL ESOPHAGEAL PROTHESIS, ESOPHAGOSCOPY WITH DILATION N/A 06/14/2016   Procedure: REVISION OF TRACHEAL STOMA WITH CO2 LASER, CHANGE OF TRACHEAL ESOPHAGEAL PROSTHESIS, ESOPHAGOSCOPY WITH DILATION;  Surgeon: Rozetta Nunnery, MD;  Location: [place];  Service: ENT;  Laterality: N/A;  . PROSTATE BIOPSY  04/29/2005  . TONSILLECTOMY    . TOTAL LARYNGECTOMY  10/28/2007  . TRACHEAL ESOPHAGEAL PROSTHESIS (TEP) CHANGE  12/04/2009; 12/06/2010  . TRACHEOSTOMY REVISION N/A 10/12/2013   Procedure: TRACHEOSTOMA  REVSION w/ CO2 LASER;  Surgeon: Rozetta Nunnery, MD;  Location: [place];  Service: ENT;  Laterality: N/A;  . TRACHEOSTOMY REVISION N/A  11/29/2014   Procedure: tracheal esophageal prosthesis change ;  Surgeon: Rozetta Nunnery, MD;  Location: [place];  Service: ENT;  Laterality: N/A;    reports that he quit smoking about 31 years ago. He quit after 25.00 years of use. He has never used smokeless tobacco. He reports that he does not drink alcohol or use drugs. family history includes Diabetes in his brother and brother; Heart attack (age of onset: 41) in  his father; Hypertension in his father. Allergies  Allergen Reactions  . Terazosin Other (See Comments)    DIZZINESS  . Levothyroxine Other (See Comments)   Current Outpatient Medications on File Prior to Visit  Medication Sig Dispense Refill  . amLODipine (NORVASC) 5 MG tablet Take 1 tablet by mouth daily.    Marland Kitchen aspirin 81 MG tablet Take 81 mg by mouth daily.    . cholecalciferol (VITAMIN D) 1000 UNITS tablet Take 1,000 Units by mouth daily.    Marland Kitchen lisinopril (PRINIVIL,ZESTRIL) 40 MG tablet Take 1 tablet by mouth daily.    Marland Kitchen loratadine (CLARITIN) 10 MG tablet Take 10 mg by mouth daily as needed for allergies.    . Multiple Vitamins-Minerals (MULTIVITAMIN ADULT PO) Take by mouth.    . pantoprazole (PROTONIX) 40 MG tablet Take 40 mg by mouth daily.    . predniSONE (DELTASONE) 10 MG tablet 3 tabs by mouth per day for 3 days,2tabs per day for 3 days,21tab per day for 3 days 18 tablet 0  . ranitidine (ZANTAC) 150 MG capsule Take 150 mg by mouth every evening.     . tamsulosin (FLOMAX) 0.4 MG CAPS capsule Take 1 capsule (0.4 mg total) by mouth daily. 30 capsule 0  . tiZANidine (ZANAFLEX) 2 MG tablet Take 1 tablet (2 mg total) by mouth every 6 (six) hours as needed for muscle spasms. 40 tablet 1  . Vitamin D, Ergocalciferol, (DRISDOL) 50000 units CAPS capsule Take 50,000 Units by mouth every 7 (seven) days.     No current facility-administered medications on file prior to visit.    Review of Systems  Constitutional: Negative for other unusual diaphoresis or sweats HENT: Negative for ear discharge or swelling Eyes: Negative for other worsening visual disturbances Respiratory: Negative for stridor or other swelling  Gastrointestinal: Negative for worsening distension or other blood Genitourinary: Negative for retention or other urinary change Musculoskeletal: Negative for other MSK pain or swelling Skin: Negative for color change or other new lesions Neurological: Negative for worsening tremors  and other numbness  Psychiatric/Behavioral: Negative for worsening agitation or other fatigue All other system neg per pt    Objective:   Physical Exam BP 126/74   Pulse (!) 105   Temp 98 F (36.7 C) (Oral)   Ht 5\' 9"  (1.753 m)   Wt 194 lb (88 kg)   SpO2 98%   BMI 28.65 kg/m  VS noted,  Constitutional: Pt appears in NAD HENT: Head: NCAT.  Right Ear: External ear normal.  Left Ear: External ear normal.  Eyes: . Pupils are equal, round, and reactive to light. Conjunctivae and EOM are normal Nose: without d/c or deformity Neck: Neck supple. Gross normal ROM Cardiovascular: Normal rate and regular rhythm.   Pulmonary/Chest: Effort normal and breath sounds without rales or wheezing.  Abd:  Soft, NT, ND, + BS, no organomegaly Spine nontender Neurological: [name] alert. At baseline orientation, motor grossly intact Skin: Skin is warm. No rashes, other new lesions, no LE edema Psychiatric: Pt behavior is normal  without agitation  No other exam findings Lab Results  Component Value Date   WBC 10.4 03/10/2018   HGB 12.1 (L) 03/10/2018   HCT 36.7 (L) 03/10/2018   PLT 420.0 (H) 03/10/2018   GLUCOSE 124 (H) 03/10/2018   CHOL 86 03/10/2018   TRIG 71.0 03/10/2018   HDL 38.20 (L) 03/10/2018   LDLCALC 33 03/10/2018   ALT 18 03/10/2018   AST 19 03/10/2018   NA 142 03/10/2018   K 3.6 03/10/2018   CL 103 03/10/2018   CREATININE 0.89 03/10/2018   BUN 16 03/10/2018   CO2 29 03/10/2018   TSH 12.97 (H) 03/10/2018   INR 1.1 (H) 12/10/2011   HGBA1C 6.3 03/10/2018       Assessment & Plan:

## 2018-03-10 NOTE — Assessment & Plan Note (Signed)
stable overall by history and exam, recent data reviewed with pt, and pt to continue medical treatment as before,  to f/u any worsening symptoms or concerns Lab Results  Component Value Date   HGBA1C 6.3 03/10/2018

## 2018-03-10 NOTE — Patient Instructions (Signed)
OK to change the hydrocodone solution to the pills  Please continue all other medications as before, and refills have been done if requested.  Please have the pharmacy call with any other refills you may need.  Please continue your efforts at being more active, low cholesterol diet, and weight control.  You are otherwise up to date with prevention measures today.  Please keep your appointments with your specialists as you may have planned - Dr Jeffie Pollock next week  You will be contacted regarding the referral for: Oncology  Please call if you change your mind about having the CT scans prior to Oncology appointment  Please go to the LAB in the Basement (turn left off the elevator) for the tests to be done today  You will be contacted by phone if any changes need to be made immediately.  Otherwise, you will receive a letter about your results with an explanation, but please check with MyChart first.  Please remember to sign up for MyChart if you have not done so, as this will be important to you in the future with finding out test results, communicating by private email, and scheduling acute appointments online when needed.  Please return in 2 months, or sooner if needed

## 2018-03-10 NOTE — Assessment & Plan Note (Addendum)
High likely per recent MRI, etiology unclear, needs better pain control - ok to change hydorocodone soln back to pill form, for f/u lab today, f/u Dr Jeffie Pollock as planned for next wk, and also refer to oncology; I did d/w pt we should consider CT chest/abd/pelvis but he wants to wait for urology f/u next wk  Note:  Total time for pt hx, exam, review of record with pt in the room, determination of diagnoses and plan for further eval and tx is > 40 min, with over 50% spent in coordination and counseling of patient including the differential dx, tx, further evaluation and other management of metastatic cancer to bone, HLD, HTN, hyperglycemia

## 2018-03-10 NOTE — Assessment & Plan Note (Signed)
stable overall by history and exam, recent data reviewed with pt, and pt to continue medical treatment as before,  to f/u any worsening symptoms or concerns Lab Results  Component Value Date   LDLCALC 33 03/10/2018

## 2018-03-11 ENCOUNTER — Telehealth: Payer: Self-pay | Admitting: Oncology

## 2018-03-11 ENCOUNTER — Other Ambulatory Visit: Payer: Self-pay | Admitting: Urology

## 2018-03-11 ENCOUNTER — Encounter: Payer: Self-pay | Admitting: Oncology

## 2018-03-11 DIAGNOSIS — C61 Malignant neoplasm of prostate: Secondary | ICD-10-CM

## 2018-03-11 NOTE — Telephone Encounter (Signed)
New patient appointment has been scheduled for the pt to see Dr. Alen Blew on 7/9 at 11am. Pt aware to arrive 30 minutes early. Letter mailed to the pt and message forward to the referring to notify the pt.

## 2018-03-17 ENCOUNTER — Encounter (HOSPITAL_COMMUNITY)
Admission: RE | Admit: 2018-03-17 | Discharge: 2018-03-17 | Disposition: A | Discharge status: Home / Self Care | Payer: Medicare Other | Source: Ambulatory Visit | Attending: Urology | Admitting: Urology

## 2018-03-17 DIAGNOSIS — C7802 Secondary malignant neoplasm of left lung: Secondary | ICD-10-CM | POA: Insufficient documentation

## 2018-03-17 DIAGNOSIS — Z923 Personal history of irradiation: Secondary | ICD-10-CM | POA: Diagnosis not present

## 2018-03-17 DIAGNOSIS — C61 Malignant neoplasm of prostate: Secondary | ICD-10-CM

## 2018-03-17 DIAGNOSIS — C7951 Secondary malignant neoplasm of bone: Secondary | ICD-10-CM | POA: Insufficient documentation

## 2018-03-17 DIAGNOSIS — J90 Pleural effusion, not elsewhere classified: Secondary | ICD-10-CM | POA: Insufficient documentation

## 2018-03-17 DIAGNOSIS — R918 Other nonspecific abnormal finding of lung field: Secondary | ICD-10-CM | POA: Diagnosis not present

## 2018-03-17 DIAGNOSIS — Z8546 Personal history of malignant neoplasm of prostate: Secondary | ICD-10-CM | POA: Diagnosis not present

## 2018-03-17 DIAGNOSIS — R9721 Rising PSA following treatment for malignant neoplasm of prostate: Secondary | ICD-10-CM | POA: Diagnosis not present

## 2018-03-17 DIAGNOSIS — C778 Secondary and unspecified malignant neoplasm of lymph nodes of multiple regions: Secondary | ICD-10-CM | POA: Diagnosis not present

## 2018-03-17 MED ORDER — AXUMIN (FLUCICLOVINE F 18) INJECTION
10.2300 | Freq: Once | INTRAVENOUS | Status: AC
  Administered 2018-03-17: 10.23 via INTRAVENOUS

## 2018-03-21 ENCOUNTER — Encounter: Payer: Self-pay | Admitting: Internal Medicine

## 2018-03-24 ENCOUNTER — Encounter: Payer: Self-pay | Admitting: Oncology

## 2018-03-24 ENCOUNTER — Inpatient Hospital Stay: Payer: Medicare Other | Attending: Oncology | Admitting: Oncology

## 2018-03-24 ENCOUNTER — Encounter: Payer: Self-pay | Admitting: Radiation Oncology

## 2018-03-24 ENCOUNTER — Telehealth: Payer: Self-pay

## 2018-03-24 VITALS — BP 155/73 | HR 117 | Temp 97.8°F | Resp 18 | Ht 69.0 in | Wt 189.1 lb

## 2018-03-24 DIAGNOSIS — Z87891 Personal history of nicotine dependence: Secondary | ICD-10-CM | POA: Diagnosis not present

## 2018-03-24 DIAGNOSIS — K5900 Constipation, unspecified: Secondary | ICD-10-CM | POA: Diagnosis not present

## 2018-03-24 DIAGNOSIS — C7951 Secondary malignant neoplasm of bone: Secondary | ICD-10-CM

## 2018-03-24 DIAGNOSIS — Z923 Personal history of irradiation: Secondary | ICD-10-CM | POA: Diagnosis not present

## 2018-03-24 DIAGNOSIS — C3432 Malignant neoplasm of lower lobe, left bronchus or lung: Secondary | ICD-10-CM

## 2018-03-24 DIAGNOSIS — C61 Malignant neoplasm of prostate: Secondary | ICD-10-CM

## 2018-03-24 DIAGNOSIS — C3490 Malignant neoplasm of unspecified part of unspecified bronchus or lung: Secondary | ICD-10-CM

## 2018-03-24 DIAGNOSIS — K219 Gastro-esophageal reflux disease without esophagitis: Secondary | ICD-10-CM | POA: Diagnosis not present

## 2018-03-24 DIAGNOSIS — C419 Malignant neoplasm of bone and articular cartilage, unspecified: Secondary | ICD-10-CM

## 2018-03-24 DIAGNOSIS — T148XXA Other injury of unspecified body region, initial encounter: Secondary | ICD-10-CM

## 2018-03-24 MED ORDER — OXYCODONE HCL 5 MG PO TABS: 5.0000 mg | ORAL_TABLET | ORAL | 0 refills | Status: DC | PRN

## 2018-03-24 MED ORDER — SENNOSIDES-DOCUSATE SODIUM 8.6-50 MG PO TABS: 1.0000 | ORAL_TABLET | Freq: Two times a day (BID) | ORAL | 3 refills | Status: DC

## 2018-03-24 NOTE — Progress Notes (Signed)
Histology and Location of Primary Cancer: Pt with recently diagnosed lung cancer. Pt also has known metastatic prostate cancer.  Sites of Visceral and Bony Metastatic Disease: Per PET scan dated 03/17/18:  IMPRESSION: Diffuse lytic bone metastases, not typical for prostate carcinoma, and instead suspicious for metastatic lung or possibly laryngeal carcinoma.  5.4 cm lung mass in posterior left lower lobe, highly suspicious for primary bronchogenic carcinoma.  Metastatic lymphadenopathy in left hilum and bilateral mediastinum.  Left lateral pleural based metastasis and tiny pleural effusion.  Location(s) of Symptomatic Metastases: Bone pain: He has an L3 lesion documented on his previous MRI.  I will refer him to Dr. Sondra Come from radiation oncology to evaluate him for palliative radiation treatments.  I will also refer him to interventional radiology for evaluation for possible kyphoplasty and obtaining tissue biopsy at the same time.     Past/Anticipated chemotherapy by medical oncology, if any: Per Dr. Alen Blew 03/24/18:   Lung neoplasm suspected with metastatic disease:  He presented with a 5.4 cm mass in the posterior left lower lobe of the lung with lymphadenopathy in the left hilum and bilateral mediastinum.    The differential diagnosis was reviewed with the patient today.  These findings are suspicious for primary lung neoplasm and metastatic disease from prostate or laryngeal cancer is unlikely.  From a management standpoint, the next step is to obtain tissue biopsy to determine the exact histology which will dictate treatment.  Molecular testing will be necessary as well especially for PDL 1 as well as any other targetable mutation.  From a staging standpoint, PET scan was adequate to determine that we are dealing with stage IV disease.  The ramification of this diagnosis was discussed with patient and his family and any treatment we are dealing with here will likely be palliative.  No  surgical resection will be opted at this time.  After obtaining tissue biopsy, systemic therapy will be tailored according to the exact pathology and the pathological features.  If we are dealing indeed with lung cancer treatment options will include systemic chemotherapy, oral targeted therapy, immunotherapy or combination of these treatments.    Pain on a scale of 0-10 is: 10, with no relief from medications or non-pharmaceutical measures   If Spine Met(s), symptoms, if any, include:  Bowel/Bladder retention or incontinence (please describe): constipation from pain meds  Numbness or weakness in extremities (please describe): weakness in legs  Current Decadron regimen, if applicable: No  Ambulatory status? Walker? Wheelchair?: single point cane  SAFETY ISSUES:  Prior radiation? Throat and prostate  Pacemaker/ICD? No  Possible current pregnancy? N/A  Is the patient on methotrexate? No  Current Complaints / other details:  Pt presents today for initial consult with Dr. Sondra Come for Radiation Oncology. [name] accompanied by daughter. [name] in severe pain, which is currently unrelieved by medications. SW consult placed R/T distress screening. Pt and daughter aware of SW consult. Loma Sousa, RN BSN

## 2018-03-24 NOTE — Progress Notes (Signed)
Reason for the request:   Lung cancer  HPI: I was asked by Dr. Jenny Reichmann to evaluate [name] for metastatic malignancy arising from the lung.  He is a pleasant gentleman with history of prostate cancer followed by [name].  He was diagnosed in 2005 with a Gleason score 6 stage T1c.  His PSA was up to 19 and that was treated with definitive radiation therapy concluded in July 2015.  His last PSA on January 13, 2018 was 0.15.  He was noted to have increased back pain and MR of the lumbar spine obtained on 02/26/2018 showed foci of metastatic disease with L3 vertebral body completely replaced by tumor.  He was evaluated by Dr. Sandrea Matte for suspicious for metastatic malignancy arising from the prostate.  He is PET scan obtained on 03/17/2018 showed a 5.4 cm mass in the posterior left lower lobe suspicious for primary lung malignancy.  Metastatic lymphadenopathy left time and bilateral mediastinum was noted.  Increased radiotracer uptake throughout the spine, sternum and pelvis were also noted corresponding to lytic bone lesions on CT scan.  He continues to have issues with back pain and has been prescribed hydrocodone.  His pain is in the lower back with radiation to the front and described as more of a stabbing persistent pain.  He is able to ambulate with the help of a cane without any falls or syncope.  Hydrocodone has been helpful at times although not completely relieving his pain.  He does report constipation associated with it.  He denies any shortness of breath or dyspnea on exertion.  He denies any hemoptysis.  He does not report any headaches, blurry vision, syncope or seizures. Does not report any fevers, chills or sweats.  Does not report any cough, wheezing or hemoptysis.  Does not report any chest pain, palpitation, orthopnea or leg edema.  Does not report any nausea, vomiting or abdominal pain.  Does not report any r diarrhea.  Does not report any skeletal complaints.    Does not report frequency, urgency  or hematuria.  Does not report any skin rashes or lesions. Does not report any heat or cold intolerance.  Does not report any lymphadenopathy or petechiae.  Does not report any anxiety or depression.  Remaining review of systems is negative.    Past Medical History:  Diagnosis Date  . Cataract, immature   . Esophageal stenosis 05/2016  . Full dentures   . GERD (gastroesophageal reflux disease)   . History of cancer of larynx 2009  . History of prostate cancer 2015  . History of radiation therapy 2015   for prostate cancer  . Hypertension    states under control with meds., has been on med. x 5-6 yr.  . 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 CO2 LASER APPLICATION N/A 02/20/3709   Procedure: CO2 LASER APPLICATION;  Surgeon: Rozetta Nunnery, MD;  Location: [place];  Service: ENT;  Laterality: N/A;  . CO2 LASER APPLICATION N/A 03/11/9484   Procedure: CO2 LASER APPLICATION;  Surgeon: Rozetta Nunnery, MD;  Location: [place];  Service: ENT;  Laterality: N/A;  . DIRECT LARYNGOSCOPY  04/10/2007; 02/23/2007   with bx.  . ESOPHAGEAL DILATION  06/24/2008; 12/04/2009; 12/06/2010; 01/10/2012  . ESOPHAGEAL DILATION N/A 10/12/2013   Procedure: ESOPHAGEAL DILATION;  Surgeon: Rozetta Nunnery, MD;  Location: [place];  Service: ENT;  Laterality: N/A;  . ESOPHAGOSCOPY WITH DILITATION  11/29/2014   Procedure: ESOPHAGOSCOPY WITH DILITATION;  Surgeon: Rozetta Nunnery, MD;  Location: [place];  Service: ENT;;  . LIPOMA EXCISION     abd.   Marland Kitchen MASS EXCISION  01/10/2012   Procedure: EXCISION MASS;  Surgeon: Rozetta Nunnery, MD;  Location: [place];  Service: ENT;  Laterality: N/A;  posterior neck mass  . NECK SURGERY  01/10/2012   tumor removed from back of neck  . PLACEMENT OF TRACHEAL ESOPHAGEAL PROTHESIS, ESOPHAGOSCOPY WITH DILATION  04/07/2008  . PLACEMENT  OF TRACHEAL ESOPHAGEAL PROTHESIS, ESOPHAGOSCOPY WITH DILATION N/A 06/14/2016   Procedure: REVISION OF TRACHEAL STOMA WITH CO2 LASER, CHANGE OF TRACHEAL ESOPHAGEAL PROSTHESIS, ESOPHAGOSCOPY WITH DILATION;  Surgeon: Rozetta Nunnery, MD;  Location: [place];  Service: ENT;  Laterality: N/A;  . PROSTATE BIOPSY  04/29/2005  . TONSILLECTOMY    . TOTAL LARYNGECTOMY  10/28/2007  . TRACHEAL ESOPHAGEAL PROSTHESIS (TEP) CHANGE  12/04/2009; 12/06/2010  . TRACHEOSTOMY REVISION N/A 10/12/2013   Procedure: TRACHEOSTOMA  REVSION w/ CO2 LASER;  Surgeon: Rozetta Nunnery, MD;  Location: [place];  Service: ENT;  Laterality: N/A;  . TRACHEOSTOMY REVISION N/A 11/29/2014   Procedure: tracheal esophageal prosthesis change ;  Surgeon: Rozetta Nunnery, MD;  Location: [place];  Service: ENT;  Laterality: N/A;  :   Current Outpatient Medications:  .  amLODipine (NORVASC) 5 MG tablet, Take 1 tablet by mouth daily., Disp: , Rfl:  .  aspirin 81 MG tablet, Take 81 mg by mouth daily., Disp: , Rfl:  .  cholecalciferol (VITAMIN D) 1000 UNITS tablet, Take 1,000 Units by mouth daily., Disp: , Rfl:  .  HYDROcodone-acetaminophen (NORCO) 7.5-325 MG tablet, Take 1 tablet by mouth every 6 (six) hours as needed for moderate pain., Disp: 120 tablet, Rfl: 0 .  lisinopril (PRINIVIL,ZESTRIL) 40 MG tablet, Take 1 tablet by mouth daily., Disp: , Rfl:  .  loratadine (CLARITIN) 10 MG tablet, Take 10 mg by mouth daily as needed for allergies., Disp: , Rfl:  .  Multiple Vitamins-Minerals (MULTIVITAMIN ADULT PO), Take by mouth., Disp: , Rfl:  .  pantoprazole (PROTONIX) 40 MG tablet, Take 40 mg by mouth daily., Disp: , Rfl:  .  predniSONE (DELTASONE) 10 MG tablet, 3 tabs by mouth per day for 3 days,2tabs per day for 3 days,21tab per day for 3 days, Disp: 18 tablet, Rfl: 0 .  ranitidine (ZANTAC) 150 MG capsule, Take 150 mg by mouth every evening. , Disp: , Rfl:  .  tamsulosin (FLOMAX)  0.4 MG CAPS capsule, Take 1 capsule (0.4 mg total) by mouth daily., Disp: 30 capsule, Rfl: 0 .  tiZANidine (ZANAFLEX) 2 MG tablet, Take 1 tablet (2 mg total) by mouth every 6 (six) hours as needed for muscle spasms., Disp: 40 tablet, Rfl: 1 .  Vitamin D, Ergocalciferol, (DRISDOL) 50000 units CAPS capsule, Take 50,000 Units by mouth every 7 (seven) days., Disp: , Rfl: :  Allergies  Allergen Reactions  . Terazosin Other (See Comments)    DIZZINESS  . Levothyroxine Other (See Comments)  :  Family History  Problem Relation Age of Onset  . Hypertension Father   . Heart attack Father 64  . Diabetes Brother   . Diabetes Brother   . Colon cancer Neg Hx   :  Social History   Socioeconomic History  . Marital status: Married    Spouse name: Not on file  . Number of children: 1  .  Years of education: Not on file  . Highest education level: Not on file  Occupational History  . Occupation: Retired  Scientific laboratory technician  . Financial resource strain: Not on file  . Food insecurity:    Worry: Not on file    Inability: Not on file  . Transportation needs:    Medical: Not on file    Non-medical: Not on file  Tobacco Use  . Smoking status: Former Smoker    Years: 25.00    Last attempt to quit: 01/07/1987    Years since quitting: 31.2  . Smokeless tobacco: Never Used  Substance and Sexual Activity  . Alcohol use: No  . Drug use: No  . Sexual activity: Not on file  Lifestyle  . Physical activity:    Days per week: Not on file    Minutes per session: Not on file  . Stress: Not on file  Relationships  . Social connections:    Talks on phone: Not on file    Gets together: Not on file    Attends religious service: Not on file    Active member of club or organization: Not on file    Attends meetings of clubs or organizations: Not on file    Relationship status: Not on file  . Intimate partner violence:    Fear of current or ex partner: Not on file    Emotionally abused: Not on file     Physically abused: Not on file    Forced sexual activity: Not on file  Other Topics Concern  . Not on file  Social History Narrative   Daily caffeine   :  Pertinent items are noted in HPI.  Exam: Blood pressure (!) 155/73, pulse (!) 117, temperature 97.8 F (36.6 C), temperature source Oral, resp. rate 18, height 5\' 9"  (1.753 m), weight 189 lb 1.6 oz (85.8 kg), SpO2 96 %.  ECOG General appearance: alert and cooperative appeared without distress. Head: atraumatic without any abnormalities. Eyes: conjunctivae/corneas clear. PERRL.  Sclera anicteric. Throat: lips, mucosa, and tongue normal; without oral thrush or ulcers. Resp: clear to auscultation bilaterally without rhonchi, wheezes or dullness to percussion. Cardio: regular rate and rhythm, S1, S2 normal, no murmur, click, rub or gallop GI: soft, non-tender; bowel sounds normal; no masses,  no organomegaly Skin: Skin color, texture, turgor normal. No rashes or lesions Lymph nodes: Cervical, supraclavicular, and axillary nodes normal. Neurologic: Grossly normal without any motor, sensory or deep tendon reflexes. Musculoskeletal: No joint deformity or effusion.  CBC    Component Value Date/Time   WBC 10.4 03/10/2018 1107   RBC 4.45 03/10/2018 1107   HGB 12.1 (L) 03/10/2018 1107   HCT 36.7 (L) 03/10/2018 1107   PLT 420.0 (H) 03/10/2018 1107   MCV 82.4 03/10/2018 1107   MCH 29.3 01/08/2014 1723   MCHC 32.9 03/10/2018 1107   RDW 13.5 03/10/2018 1107   LYMPHSABS 0.5 (L) 03/10/2018 1107   MONOABS 1.1 (H) 03/10/2018 1107   EOSABS 0.1 03/10/2018 1107   BASOSABS 0.0 03/10/2018 1107     Mr Lumbar Spine Wo Contrast  Result Date: 02/26/2018 CLINICAL DATA:  Low back pain for 1 month in a patient with a history of prostate cancer. EXAM: MRI LUMBAR SPINE WITHOUT CONTRAST TECHNIQUE: Multiplanar, multisequence MR imaging of the lumbar spine was performed. No intravenous contrast was administered. COMPARISON:  Whole-body bone scan  11/22/2013. FINDINGS: Segmentation:  Standard. Alignment:  Maintained. Vertebrae: There is abnormal marrow signal in all imaged bones most consistent with  metastatic disease. The L3 vertebral body is almost completely replaced by tumor. Small lesions are also identified in the iliac wings and sacrum. Scattered Schmorl's nodes are noted but no fracture is identified. Degenerative endplate signal change is seen at L4-5. Conus medullaris and cauda equina: Conus extends to the L2 level. Conus and cauda equina appear normal. Paraspinal and other soft tissues: Negative. Disc levels: T10-11: Negative. T11-12: Negative. T12-L1: Negative. L1-2: Negative. L2-3: Minimal disc bulge without stenosis L3-4: Minimal disc bulge without stenosis. L4-5: Shallow disc bulge and endplate spur. The central canal is open. Mild to moderate bilateral foraminal narrowing is seen. L5-S1: There is some facet degenerative disease. Shallow central protrusion is seen. The central canal is widely patent. Mild bilateral foraminal narrowing is seen. IMPRESSION: Foci of abnormal signal in all imaged bones consistent metastatic disease, likely prostate carcinoma given the patient's history. The L3 vertebral body is almost completely replaced by tumor. Negative for fracture. Mild lumbar degenerative disease as described above. These results will be called to the ordering clinician or representative by the Radiologist Assistant, and communication documented in the PACS or zVision Dashboard. Electronically Signed   By: Inge Rise M.D.   On: 02/26/2018 13:43   Nm Pet (axumin) Skull Base To Mid Thigh  Result Date: 03/18/2018 CLINICAL DATA:  Prostate carcinoma with biochemical recurrence. Personal history of laryngeal carcinoma. EXAM: NUCLEAR MEDICINE PET SKULL BASE TO THIGH TECHNIQUE: 10.2 mCi F-18 Fluciclovine was injected intravenously. Full-ring PET imaging was performed from the skull base to thigh after the radiotracer. CT data was obtained  and used for attenuation correction and anatomic localization. COMPARISON:  Lumbar MRI on 02/26/2018 FINDINGS: NECK No radiotracer activity in neck lymph nodes. Incidental CT finding: None CHEST A 5.4 cm mass is seen in the posterior left lower lobe which shows radiotracer uptake. This is highly suspicious for primary bronchogenic carcinoma. There are other scattered sub-cm pulmonary nodule seen in both lungs which show no radiotracer uptake, but are suspicious for small pulmonary metastases. Moderate emphysema is also noted. A focus of radiotracer uptake is seen within the left lateral pleural space, consistent with pleural metastasis. Radiotracer uptake is seen within mild left hilar lymphadenopathy. Mild mediastinal lymphadenopathy is also seen subcarinal, AP window, lateral aortic, and right paratracheal regions which shows radiotracer uptake. Incidental CT finding: None ABDOMEN/PELVIS Prostate: Several fiducial markers are seen within the prostate which is mildly enlarged. Mild to moderate diffuse radiotracer uptake is seen prostate gland. Lymph nodes: No abnormal radiotracer accumulation within pelvic or abdominal nodes. Liver: No evidence of liver metastasis Incidental CT finding: None SKELETON Foci of increased radiotracer uptake are seen throughout the spine, sternum, and pelvis, which correspond to lytic bone lesions on CT. These are not typical for prostate carcinoma, and are suspicious for metastatic lung carcinoma or possibly laryngeal carcinoma. IMPRESSION: Diffuse lytic bone metastases, not typical for prostate carcinoma, and instead suspicious for metastatic lung or possibly laryngeal carcinoma. 5.4 cm lung mass in posterior left lower lobe, highly suspicious for primary bronchogenic carcinoma. Metastatic lymphadenopathy in left hilum and bilateral mediastinum. Left lateral pleural based metastasis and tiny pleural effusion. These results will be called to the ordering clinician or representative by  the Radiologist Assistant, and communication documented in the PACS or zVision Dashboard. Electronically Signed   By: Earle Gell M.D.   On: 03/18/2018 08:39    Assessment and Plan:   84 year old man with the following issues:  1.  Lung neoplasm suspected with metastatic disease:  He presented  with a 5.4 cm mass in the posterior left lower lobe of the lung with lymphadenopathy in the left hilum and bilateral mediastinum.    The differential diagnosis was reviewed with the patient today.  These findings are suspicious for primary lung neoplasm and metastatic disease from prostate or laryngeal cancer is unlikely.  From a management standpoint, the next step is to obtain tissue biopsy to determine the exact histology which will dictate treatment.  Molecular testing will be necessary as well especially for PDL 1 as well as any other targetable mutation.  From a staging standpoint, PET scan was adequate to determine that we are dealing with stage IV disease.  The ramification of this diagnosis was discussed with patient and his family and any treatment we are dealing with here will likely be palliative.  No surgical resection will be opted at this time.  After obtaining tissue biopsy, systemic therapy will be tailored according to the exact pathology and the pathological features.  If we are dealing indeed with lung cancer treatment options will include systemic chemotherapy, oral targeted therapy, immunotherapy or combination of these treatments.  2.  Bone pain: He has an L3 lesion documented on his previous MRI.  I will refer him to Dr. Sondra Come from radiation oncology to evaluate him for palliative radiation treatments.  I will also refer him to interventional radiology for evaluation for possible kyphoplasty and obtaining tissue biopsy at the same time.  I gave a prescription for oxycodone to use for the time being.  3.  Constipation: We have discussed strategies to combat this including using stool  softener which I prescribed to him today.  4.  Prostate cancer: His PSA remains under control at 0.15 without any further treatment.  I doubt this is metastatic prostate cancer at this time.  5.  Goals of care and prognosis: His disease is incurable and any treatment is palliative.  Prognosis is overall poor given his advanced malignancy and pain.  His performance status remains adequate and aggressive therapy is warranted for the time being.  6.  Follow-up: We will be in the next few weeks to follow his progress.   Thank you for the referral.  I had the pleasure of meeting this patient today.  A copy of this consult has been forwarded to the requesting physician.  60  minutes was spent with the patient face-to-face today.  More than 50% of time was dedicated to patient counseling, education and coordination of the his multifaceted care.

## 2018-03-24 NOTE — Telephone Encounter (Signed)
Printed avs and calender of upcoming appointment. Per 7/9 los 

## 2018-03-25 ENCOUNTER — Telehealth: Payer: Self-pay

## 2018-03-25 ENCOUNTER — Ambulatory Visit
Admission: RE | Admit: 2018-03-25 | Discharge: 2018-03-25 | Disposition: A | Discharge status: Home / Self Care | Payer: Medicare Other | Source: Ambulatory Visit | Attending: Radiation Oncology | Admitting: Radiation Oncology

## 2018-03-25 ENCOUNTER — Other Ambulatory Visit: Payer: Self-pay

## 2018-03-25 ENCOUNTER — Encounter: Payer: Self-pay | Admitting: Radiation Oncology

## 2018-03-25 VITALS — BP 146/76 | HR 122 | Temp 98.2°F | Resp 20 | Wt 185.6 lb

## 2018-03-25 DIAGNOSIS — G893 Neoplasm related pain (acute) (chronic): Secondary | ICD-10-CM | POA: Diagnosis not present

## 2018-03-25 DIAGNOSIS — Z923 Personal history of irradiation: Secondary | ICD-10-CM | POA: Diagnosis not present

## 2018-03-25 DIAGNOSIS — C801 Malignant (primary) neoplasm, unspecified: Secondary | ICD-10-CM | POA: Diagnosis not present

## 2018-03-25 DIAGNOSIS — C7951 Secondary malignant neoplasm of bone: Secondary | ICD-10-CM

## 2018-03-25 DIAGNOSIS — Z8546 Personal history of malignant neoplasm of prostate: Secondary | ICD-10-CM | POA: Diagnosis not present

## 2018-03-25 DIAGNOSIS — Z8521 Personal history of malignant neoplasm of larynx: Secondary | ICD-10-CM | POA: Diagnosis not present

## 2018-03-25 MED ORDER — MORPHINE SULFATE ER 30 MG PO TBCR: 30.0000 mg | EXTENDED_RELEASE_TABLET | Freq: Two times a day (BID) | ORAL | 0 refills | Status: DC

## 2018-03-25 NOTE — Telephone Encounter (Signed)
Contacted pt's wife to convey that Dr. Sondra Come had called in a long-acting pain medication for pt. Wife stated that daughter had just went to pick up script. Encouraged wife to call back with any questions/concerns. Wife verbalized understanding. Loma Sousa, RN BSN

## 2018-03-25 NOTE — Telephone Encounter (Signed)
Spoke with Dr. Alen Blew regarding pain management for pt. Complaint that new oxycodone prescription not working. Pt still experiencing 9/10 pain. Dr. Tammi Klippel saw pt for initial visit today and started on long-acting morphine in addition to oxycodone. Dr. Alen Blew had previously discussed with Dr. Tammi Klippel. In agreement with pain coverage plan.

## 2018-03-25 NOTE — Progress Notes (Signed)
Radiation Oncology         [phone] ________________________________  Initial Outpatient Consultation  Name: [name] MRN: [id number]  Date: 03/25/2018  DOB: July 25, 1931  SA:YTKZ, Hunt Oris, MD  [name], MD   REFERRING PHYSICIAN: [name], MD  DIAGNOSIS: 84 year-old gentleman with presumptive metastatic lung cancer.  HISTORY OF PRESENT ILLNESS::[name] is a 84 y.o. male who is seen today regarding probable diagnosis of lung cancer. Patient also has known metastatic prostate cancer as well as a prior history of laryngeal carcinoma   The patient was diagnosed in 2005 with a Gleason score 6 Stage T1c prostate cancer. His PSA was up to 19 and that was treated with definitive radiation therapy concluded in July 2015. His last PSA on 01/13/2018 was 0.15. He was noted to have increased back pain and MRI lumbar spine was performed on 02/26/2018. This imaging showed foci of abnormal signal in all imaged bones consistent metastatic disease, likely prostate carcinoma given the patient's history. The L3 vertebral body is almost completely replaced by tumor. Negative for fracture. Mild lumbar degenerative disease.  The patient was then evaluated by [name] for suspicious metastatic malignancy arising from the prostate. Accordingly a PET scan was obtained on 03/17/2018 which shows diffuse lytic bone metastases, not typical for prostate carcinoma, and instead suspicious for metastatic lung or possibly laryngeal carcinoma. There is a 5.4 cm lung mass in posterior left lower lobe, highly suspicious for primary bronchogenic carcinoma. Metastatic lymphadenopathy in left hilum and bilateral mediastinum. Left lateral pleural based metastasis and tiny pleural effusion.  The patient is here today for further evaluation and discussion of radiation treatment options in the management of his disease.  PREVIOUS RADIATION THERAPY: Yes;  02/14/2014 - 04/12/2014: Prostate treated to 78 Gy in 40  fractions with Dr. Sondra Come. 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    PAST SURGICAL HISTORY: Past Surgical History:  Procedure Laterality Date  . CO2 LASER APPLICATION N/A 02/15/931   Procedure: CO2 LASER APPLICATION;  Surgeon: Rozetta Nunnery, MD;  Location: [place];  Service: ENT;  Laterality: N/A;  . CO2 LASER APPLICATION N/A 3/55/7322   Procedure: CO2 LASER APPLICATION;  Surgeon: Rozetta Nunnery, MD;  Location: [place];  Service: ENT;  Laterality: N/A;  . DIRECT LARYNGOSCOPY  04/10/2007; 02/23/2007   with bx.  . ESOPHAGEAL DILATION  06/24/2008; 12/04/2009; 12/06/2010; 01/10/2012  . ESOPHAGEAL DILATION N/A 10/12/2013   Procedure: ESOPHAGEAL DILATION;  Surgeon: Rozetta Nunnery, MD;  Location: [place];  Service: ENT;  Laterality: N/A;  . ESOPHAGOSCOPY WITH DILITATION  11/29/2014   Procedure: ESOPHAGOSCOPY WITH DILITATION;  Surgeon: Rozetta Nunnery, MD;  Location: [place];  Service: ENT;;  . LIPOMA EXCISION     abd.   Marland Kitchen MASS EXCISION  01/10/2012   Procedure: EXCISION MASS;  Surgeon: Rozetta Nunnery, MD;  Location: [place];  Service: ENT;  Laterality: N/A;  posterior neck mass  . NECK SURGERY  01/10/2012   tumor removed from back of neck  . PLACEMENT OF TRACHEAL ESOPHAGEAL PROTHESIS, ESOPHAGOSCOPY WITH  DILATION  04/07/2008  . PLACEMENT OF TRACHEAL ESOPHAGEAL PROTHESIS, ESOPHAGOSCOPY WITH DILATION N/A 06/14/2016   Procedure: REVISION OF TRACHEAL STOMA WITH CO2 LASER,  CHANGE OF TRACHEAL ESOPHAGEAL PROSTHESIS, ESOPHAGOSCOPY WITH DILATION;  Surgeon: Rozetta Nunnery, MD;  Location: [place];  Service: ENT;  Laterality: N/A;  . PROSTATE BIOPSY  04/29/2005  . TONSILLECTOMY    . TOTAL LARYNGECTOMY  10/28/2007  . TRACHEAL ESOPHAGEAL PROSTHESIS (TEP) CHANGE  12/04/2009; 12/06/2010  . TRACHEOSTOMY REVISION N/A 10/12/2013   Procedure: TRACHEOSTOMA  REVSION w/ CO2 LASER;  Surgeon: Rozetta Nunnery, MD;  Location: [place];  Service: ENT;  Laterality: N/A;  . TRACHEOSTOMY REVISION N/A 11/29/2014   Procedure: tracheal esophageal prosthesis change ;  Surgeon: Rozetta Nunnery, MD;  Location: [place];  Service: ENT;  Laterality: N/A;    FAMILY HISTORY: family history includes Diabetes in his brother and brother; Heart attack (age of onset: 43) in his father; Hypertension in his father.  SOCIAL HISTORY:  reports that he quit smoking about 31 years ago. He quit after 25.00 years of use. He has never used smokeless tobacco. He reports that he does not drink alcohol or use drugs.  ALLERGIES: Terazosin and Levothyroxine  MEDICATIONS:  Current Outpatient Medications  Medication Sig Dispense Refill  . amLODipine (NORVASC) 5 MG tablet Take 1 tablet by mouth daily.    Marland Kitchen aspirin 81 MG tablet Take 81 mg by mouth daily.    . cholecalciferol (VITAMIN D) 1000 UNITS tablet Take 1,000 Units by mouth daily.    Marland Kitchen HYDROcodone-acetaminophen (NORCO) 7.5-325 MG tablet Take 1 tablet by mouth every 6 (six) hours as needed for moderate pain. 120 tablet 0  . lisinopril (PRINIVIL,ZESTRIL) 40 MG tablet Take 1 tablet by mouth daily.    Marland Kitchen loratadine (CLARITIN) 10 MG tablet Take 10 mg by mouth daily as needed for allergies.    . Multiple Vitamins-Minerals (MULTIVITAMIN ADULT PO) Take by mouth.    . pantoprazole (PROTONIX) 40 MG tablet Take 40 mg by mouth daily.    . ranitidine (ZANTAC) 150 MG capsule Take 150 mg by mouth every evening.      . senna-docusate (SENNA S) 8.6-50 MG tablet Take 1 tablet by mouth 2 (two) times daily. 60 tablet 3  . tamsulosin (FLOMAX) 0.4 MG CAPS capsule Take 1 capsule (0.4 mg total) by mouth daily. 30 capsule 0  . Vitamin D, Ergocalciferol, (DRISDOL) 50000 units CAPS capsule Take 50,000 Units by mouth every 7 (seven) days.    Marland Kitchen oxyCODONE (OXY IR/ROXICODONE) 5 MG immediate release tablet Take 1 tablet (5 mg total) by mouth every 4 (four) hours as needed for severe pain. (Patient not taking: Reported on 03/25/2018) 30 tablet 0  . predniSONE (DELTASONE) 10 MG tablet 3 tabs by mouth per day for 3 days,2tabs per day for 3 days,21tab per day for 3 days (Patient not taking: Reported on 03/25/2018) 18 tablet 0  . tiZANidine (ZANAFLEX) 2 MG tablet Take 1 tablet (2 mg total) by mouth every 6 (six) hours as needed for muscle spasms. (Patient not taking: Reported on 03/25/2018) 40 tablet 1   No current facility-administered medications for this encounter.     REVIEW OF SYSTEMS: On review of systems, the patient reports that he is doing well overall. He is accompanied by his daughter today. He reports severe back pain as 10 out of 10 on pain scale, with no relief from medications or non-pharmaceutical measures. This pain began about one month  ago. He is not eating or sleeping well secondary to pain. He has lost about 20 lbs. He reports constipation from pain medication. He reports weakness in legs. He denies lower extremity numbness. He is not currently taking Decadron. He uses a single point cane to ambulate. He denies dizziness with standing. Patient states he is able to lay completely flat.   A 10+ POINT REVIEW OF SYSTEMS WAS OBTAINED including neurology, dermatology, psychiatry, cardiac, respiratory, lymph, extremities, GI, GU, musculoskeletal, constitutional, reproductive, HEENT. All pertinent positives are noted in the HPI. All others are negative.   PHYSICAL EXAM:  weight is 185 lb 9.6 oz (84.2 kg). His oral  temperature is 98.2 F (36.8 C). His blood pressure is 146/76 (abnormal) and his pulse is 122 (abnormal). His respiration is 20 and oxygen saturation is 94%.   General: Alert and oriented, in no acute distress HEENT: Head is normocephalic. Extraocular movements are intact. Oropharynx is clear. Dentures on top and bottom. Neck: Neck is Byrd, no palpable cervical or supraclavicular lymphadenopathy. Heart: Regular in rate and rhythm with no murmurs, rubs, or gallops. Chest: Clear to auscultation bilaterally, with no rhonchi, wheezes, or rales. Abdomen: Soft, nontender, nondistended, with no rigidity or guarding. Extremities: No cyanosis or edema. Lymphatics: see Neck Exam Skin: No concerning lesions. Musculoskeletal: symmetric strength and muscle tone throughout. Increased pain with palpation of lower back, consistent with his L3 disease. Neurologic: Cranial nerves II through XII are grossly intact. No obvious focalities. Speech is fluent. Coordination is intact. Psychiatric: Judgment and insight are intact. Affect is appropriate. Patient laying on exam table for duration of examination in light of his severe back pain.  ECOG = 3  0 - Asymptomatic (Fully active, able to carry on all predisease activities without restriction)  1 - Symptomatic but completely ambulatory (Restricted in physically strenuous activity but ambulatory and able to carry out work of a light or sedentary nature. For example, light housework, office work)  2 - Symptomatic, <50% in bed during the day (Ambulatory and capable of all self care but unable to carry out any work activities. Up and about more than 50% of waking hours)  3 - Symptomatic, >50% in bed, but not bedbound (Capable of only limited self-care, confined to bed or chair 50% or more of waking hours)  4 - Bedbound (Completely disabled. Cannot carry on any self-care. Totally confined to bed or chair)  5 - Death   Eustace Pen MM, Creech RH, Tormey DC, et al.  [phone]). "Toxicity and response criteria of the [facility]". [place] Oncol. 5 (6): 649-55  LABORATORY DATA:  Lab Results  Component Value Date   WBC 10.4 03/10/2018   HGB 12.1 (L) 03/10/2018   HCT 36.7 (L) 03/10/2018   MCV 82.4 03/10/2018   PLT 420.0 (H) 03/10/2018   NEUTROABS 8.7 (H) 03/10/2018   Lab Results  Component Value Date   NA 142 03/10/2018   K 3.6 03/10/2018   CL 103 03/10/2018   CO2 29 03/10/2018   GLUCOSE 124 (H) 03/10/2018   CREATININE 0.89 03/10/2018   CALCIUM 9.5 03/10/2018      RADIOGRAPHY: Mr Lumbar Spine Wo Contrast  Result Date: 02/26/2018 CLINICAL DATA:  Low back pain for 1 month in a patient with a history of prostate cancer. EXAM: MRI LUMBAR SPINE WITHOUT CONTRAST TECHNIQUE: Multiplanar, multisequence MR imaging of the lumbar spine was performed. No intravenous contrast was administered. COMPARISON:  Whole-body bone scan 11/22/2013. FINDINGS: Segmentation:  Standard. Alignment:  Maintained.  Vertebrae: There is abnormal marrow signal in all imaged bones most consistent with metastatic disease. The L3 vertebral body is almost completely replaced by tumor. Small lesions are also identified in the iliac wings and sacrum. Scattered Schmorl's nodes are noted but no fracture is identified. Degenerative endplate signal change is seen at L4-5. Conus medullaris and cauda equina: Conus extends to the L2 level. Conus and cauda equina appear normal. Paraspinal and other soft tissues: Negative. Disc levels: T10-11: Negative. T11-12: Negative. T12-L1: Negative. L1-2: Negative. L2-3: Minimal disc bulge without stenosis L3-4: Minimal disc bulge without stenosis. L4-5: Shallow disc bulge and endplate spur. The central canal is open. Mild to moderate bilateral foraminal narrowing is seen. L5-S1: There is some facet degenerative disease. Shallow central protrusion is seen. The central canal is widely patent. Mild bilateral foraminal narrowing is seen.  IMPRESSION: Foci of abnormal signal in all imaged bones consistent metastatic disease, likely prostate carcinoma given the patient's history. The L3 vertebral body is almost completely replaced by tumor. Negative for fracture. Mild lumbar degenerative disease as described above. These results will be called to the ordering clinician or representative by the Radiologist Assistant, and communication documented in the PACS or zVision Dashboard. Electronically Signed   By: Inge Rise M.D.   On: 02/26/2018 13:43   Nm Pet (axumin) Skull Base To Mid Thigh  Result Date: 03/18/2018 CLINICAL DATA:  Prostate carcinoma with biochemical recurrence. Personal history of laryngeal carcinoma. EXAM: NUCLEAR MEDICINE PET SKULL BASE TO THIGH TECHNIQUE: 10.2 mCi F-18 Fluciclovine was injected intravenously. Full-ring PET imaging was performed from the skull base to thigh after the radiotracer. CT data was obtained and used for attenuation correction and anatomic localization. COMPARISON:  Lumbar MRI on 02/26/2018 FINDINGS: NECK No radiotracer activity in neck lymph nodes. Incidental CT finding: None CHEST A 5.4 cm mass is seen in the posterior left lower lobe which shows radiotracer uptake. This is highly suspicious for primary bronchogenic carcinoma. There are other scattered sub-cm pulmonary nodule seen in both lungs which show no radiotracer uptake, but are suspicious for small pulmonary metastases. Moderate emphysema is also noted. A focus of radiotracer uptake is seen within the left lateral pleural space, consistent with pleural metastasis. Radiotracer uptake is seen within mild left hilar lymphadenopathy. Mild mediastinal lymphadenopathy is also seen subcarinal, AP window, lateral aortic, and right paratracheal regions which shows radiotracer uptake. Incidental CT finding: None ABDOMEN/PELVIS Prostate: Several fiducial markers are seen within the prostate which is mildly enlarged. Mild to moderate diffuse radiotracer  uptake is seen prostate gland. Lymph nodes: No abnormal radiotracer accumulation within pelvic or abdominal nodes. Liver: No evidence of liver metastasis Incidental CT finding: None SKELETON Foci of increased radiotracer uptake are seen throughout the spine, sternum, and pelvis, which correspond to lytic bone lesions on CT. These are not typical for prostate carcinoma, and are suspicious for metastatic lung carcinoma or possibly laryngeal carcinoma. IMPRESSION: Diffuse lytic bone metastases, not typical for prostate carcinoma, and instead suspicious for metastatic lung or possibly laryngeal carcinoma. 5.4 cm lung mass in posterior left lower lobe, highly suspicious for primary bronchogenic carcinoma. Metastatic lymphadenopathy in left hilum and bilateral mediastinum. Left lateral pleural based metastasis and tiny pleural effusion. These results will be called to the ordering clinician or representative by the Radiologist Assistant, and communication documented in the PACS or zVision Dashboard. Electronically Signed   By: Earle Gell M.D.   On: 03/18/2018 08:39      IMPRESSION: 84 year-old gentleman with presumptive metastatic lung  cancer.  Presumptive Stage IV lung cancer with extensive osseous metastases, in the setting of prior prostate cancer and laryngeal cancer. Imaging studies are more consistent with metastatic lung cancer rather than recurrent prostate cancer. PSA remains low after his radiation treatment. Patient will proceed with CT biopsy of his lung mass 04/01/2018. At a later date he will undergo anticipated vertebrolplasty at L3. Given the intensity of his pain I would recommend proceeding with urgent radiation therapy which the patient agrees with. The patient will return tomorrow for CT simulation and plan for treatments to begin early next week.  Today, I talked to the patient and family about the findings and work-up thus far.  We discussed the natural history of lung  cancer and general  treatment, highlighting the role of radiotherapy in the management.  We discussed the available radiation techniques, and focused on the details of logistics and delivery.  We reviewed the anticipated acute and late sequelae associated with radiation in this setting.  The patient was encouraged to ask questions that I answered to the best of my ability. The patient would like to proceed with radiation and will be scheduled for CT simulation.  PLAN: CT simulation at 10 am tomorrow. Due to patient's pain severity, he will be placed on a long-acting pain medication, MS Contin 30 mg bid.      ------------------------------------------------  Blair Promise, PhD, MD  This document serves as a record of services personally performed by Gery Pray, MD. It was created on his behalf by Arlyce Harman, a trained medical scribe. The creation of this record is based on the scribe's personal observations and the provider's statements to them. This document has been checked and approved by the attending provider.

## 2018-03-26 ENCOUNTER — Encounter: Payer: Self-pay | Admitting: General Practice

## 2018-03-26 ENCOUNTER — Ambulatory Visit
Admission: RE | Admit: 2018-03-26 | Discharge: 2018-03-26 | Disposition: A | Discharge status: Home / Self Care | Payer: Medicare Other | Source: Ambulatory Visit | Attending: Radiation Oncology | Admitting: Radiation Oncology

## 2018-03-26 DIAGNOSIS — C3490 Malignant neoplasm of unspecified part of unspecified bronchus or lung: Secondary | ICD-10-CM | POA: Diagnosis not present

## 2018-03-26 DIAGNOSIS — Z510 Encounter for antineoplastic radiation therapy: Secondary | ICD-10-CM | POA: Insufficient documentation

## 2018-03-26 DIAGNOSIS — C7951 Secondary malignant neoplasm of bone: Secondary | ICD-10-CM | POA: Diagnosis not present

## 2018-03-26 MED ORDER — DEXAMETHASONE 4 MG PO TABS: 4.0000 mg | ORAL_TABLET | Freq: Two times a day (BID) | ORAL | 0 refills | Status: DC

## 2018-03-26 NOTE — Progress Notes (Signed)
  Radiation Oncology         [phone] ________________________________  Name: Marquice Uddin MRN: [id number]  Date: 03/26/2018  DOB: 10-26-1930  SIMULATION AND TREATMENT PLANNING NOTE    ICD-10-CM   1. Metastatic cancer to bone [facility]) C79.51     DIAGNOSIS:  Presumptive Stage IV Lung Cancer with Osteometastasis   NARRATIVE:  The patient was brought to the [facility].  Identity was confirmed.  All relevant records and images related to the planned course of therapy were reviewed.  The patient freely provided informed written consent to proceed with treatment after reviewing the details related to the planned course of therapy. The consent form was witnessed and verified by the simulation staff.  Then, the patient was set-up in a stable reproducible  supine position for radiation therapy.  CT images were obtained.  Surface markings were placed.  The CT images were loaded into the planning software.  Then the target and avoidance structures were contoured.  Treatment planning then occurred.  The radiation prescription was entered and confirmed.  Then, I designed and supervised the construction of a total of 3 medically necessary complex treatment devices.  I have requested : 3D Simulation.  I have requested a DVH of the following structures: GTV, spinal cord, and kidneys.  I have ordered: dose calculation.  PLAN:  The patient will receive 30 Gy in 10 fractions.  -----------------------------------  Blair Promise, PhD, MD  This document serves as a record of services personally performed by Gery Pray, MD. It was created on his behalf by Wilburn Mylar, a trained medical scribe. The creation of this record is based on the scribe's personal observations and the provider's statements to them. This document has been checked and approved by the attending provider.

## 2018-03-26 NOTE — Progress Notes (Signed)
[name] Psychosocial Distress Screening Clinical Social Work  Clinical Social Work was referred by distress screening protocol.  The patient scored a 10 on the Psychosocial Distress Thermometer which indicates severe distress. Clinical Social Worker contacted patient by phone to assess for distress and other psychosocial needs. Patient very difficulty to understand on phone, says there is no one else who can speak w CSW.  Says daughter does take him to appointments.  Per RN from clinic yesterday, she may not be able to help all the time.  Asked patient to ask to speak w CSW on next appointment in hopes that we can speak w both patient and family member.  Transportation help is available if needed, must be scheduled in advance.    ONCBCN DISTRESS SCREENING 03/25/2018  Screening Type Initial Screening  Distress experienced in past week (1-10) 10  Practical problem type Transportation  Family Problem type   Emotional problem type Nervousness/Anxiety;Adjusting to illness  Information Concerns Type Lack of info about diagnosis;Lack of info about treatment  Physical Problem type Pain;Getting around;Bathing/dressing;Loss of appetitie;Constipation/diarrhea;Sexual problems;Skin dry/itchy    Clinical Social Worker follow up needed: yes  If yes, follow up plan:  Meet w patient on next [facility] visit due to communication barriers over telephone.  Beverely Pace, [place], LCSW Clinical Social Worker Phone:  [phone]

## 2018-03-30 ENCOUNTER — Inpatient Hospital Stay (HOSPITAL_COMMUNITY)
Admission: EM | Admit: 2018-03-30 | Discharge: 2018-04-16 | DRG: 987 | Disposition: A | Discharge status: Hospice | Payer: Medicare Other | Attending: Internal Medicine | Admitting: Internal Medicine

## 2018-03-30 DIAGNOSIS — R918 Other nonspecific abnormal finding of lung field: Secondary | ICD-10-CM | POA: Diagnosis not present

## 2018-03-30 DIAGNOSIS — I10 Essential (primary) hypertension: Secondary | ICD-10-CM | POA: Diagnosis not present

## 2018-03-30 DIAGNOSIS — R1310 Dysphagia, unspecified: Secondary | ICD-10-CM

## 2018-03-30 DIAGNOSIS — R000 Tachycardia, unspecified: Secondary | ICD-10-CM | POA: Diagnosis present

## 2018-03-30 DIAGNOSIS — Z9089 Acquired absence of other organs: Secondary | ICD-10-CM

## 2018-03-30 DIAGNOSIS — J690 Pneumonitis due to inhalation of food and vomit: Principal | ICD-10-CM | POA: Diagnosis present

## 2018-03-30 DIAGNOSIS — F419 Anxiety disorder, unspecified: Secondary | ICD-10-CM | POA: Diagnosis present

## 2018-03-30 DIAGNOSIS — G893 Neoplasm related pain (acute) (chronic): Secondary | ICD-10-CM | POA: Diagnosis present

## 2018-03-30 DIAGNOSIS — R0902 Hypoxemia: Secondary | ICD-10-CM

## 2018-03-30 DIAGNOSIS — C3432 Malignant neoplasm of lower lobe, left bronchus or lung: Secondary | ICD-10-CM | POA: Diagnosis present

## 2018-03-30 DIAGNOSIS — Z833 Family history of diabetes mellitus: Secondary | ICD-10-CM

## 2018-03-30 DIAGNOSIS — Y95 Nosocomial condition: Secondary | ICD-10-CM | POA: Diagnosis present

## 2018-03-30 DIAGNOSIS — G9341 Metabolic encephalopathy: Secondary | ICD-10-CM | POA: Diagnosis present

## 2018-03-30 DIAGNOSIS — R1319 Other dysphagia: Secondary | ICD-10-CM | POA: Diagnosis present

## 2018-03-30 DIAGNOSIS — Z8546 Personal history of malignant neoplasm of prostate: Secondary | ICD-10-CM

## 2018-03-30 DIAGNOSIS — R64 Cachexia: Secondary | ICD-10-CM | POA: Diagnosis present

## 2018-03-30 DIAGNOSIS — K922 Gastrointestinal hemorrhage, unspecified: Secondary | ICD-10-CM | POA: Diagnosis present

## 2018-03-30 DIAGNOSIS — Z930 Tracheostomy status: Secondary | ICD-10-CM

## 2018-03-30 DIAGNOSIS — J309 Allergic rhinitis, unspecified: Secondary | ICD-10-CM | POA: Diagnosis present

## 2018-03-30 DIAGNOSIS — Z8249 Family history of ischemic heart disease and other diseases of the circulatory system: Secondary | ICD-10-CM

## 2018-03-30 DIAGNOSIS — J9601 Acute respiratory failure with hypoxia: Secondary | ICD-10-CM | POA: Diagnosis present

## 2018-03-30 DIAGNOSIS — E440 Moderate protein-calorie malnutrition: Secondary | ICD-10-CM

## 2018-03-30 DIAGNOSIS — Z6826 Body mass index (BMI) 26.0-26.9, adult: Secondary | ICD-10-CM

## 2018-03-30 DIAGNOSIS — Z66 Do not resuscitate: Secondary | ICD-10-CM | POA: Diagnosis present

## 2018-03-30 DIAGNOSIS — J9600 Acute respiratory failure, unspecified whether with hypoxia or hypercapnia: Secondary | ICD-10-CM | POA: Diagnosis present

## 2018-03-30 DIAGNOSIS — Z515 Encounter for palliative care: Secondary | ICD-10-CM | POA: Diagnosis present

## 2018-03-30 DIAGNOSIS — Z972 Presence of dental prosthetic device (complete) (partial): Secondary | ICD-10-CM

## 2018-03-30 DIAGNOSIS — Z7982 Long term (current) use of aspirin: Secondary | ICD-10-CM

## 2018-03-30 DIAGNOSIS — Z8521 Personal history of malignant neoplasm of larynx: Secondary | ICD-10-CM

## 2018-03-30 DIAGNOSIS — Z4659 Encounter for fitting and adjustment of other gastrointestinal appliance and device: Secondary | ICD-10-CM

## 2018-03-30 DIAGNOSIS — Z888 Allergy status to other drugs, medicaments and biological substances status: Secondary | ICD-10-CM

## 2018-03-30 DIAGNOSIS — D62 Acute posthemorrhagic anemia: Secondary | ICD-10-CM | POA: Diagnosis present

## 2018-03-30 DIAGNOSIS — R509 Fever, unspecified: Secondary | ICD-10-CM

## 2018-03-30 DIAGNOSIS — C781 Secondary malignant neoplasm of mediastinum: Secondary | ICD-10-CM | POA: Diagnosis present

## 2018-03-30 DIAGNOSIS — K219 Gastro-esophageal reflux disease without esophagitis: Secondary | ICD-10-CM | POA: Diagnosis present

## 2018-03-30 DIAGNOSIS — R042 Hemoptysis: Secondary | ICD-10-CM | POA: Diagnosis present

## 2018-03-30 DIAGNOSIS — R531 Weakness: Secondary | ICD-10-CM | POA: Diagnosis not present

## 2018-03-30 DIAGNOSIS — C7951 Secondary malignant neoplasm of bone: Secondary | ICD-10-CM | POA: Diagnosis present

## 2018-03-30 DIAGNOSIS — Z7952 Long term (current) use of systemic steroids: Secondary | ICD-10-CM

## 2018-03-30 DIAGNOSIS — N390 Urinary tract infection, site not specified: Secondary | ICD-10-CM | POA: Diagnosis present

## 2018-03-30 DIAGNOSIS — J189 Pneumonia, unspecified organism: Secondary | ICD-10-CM

## 2018-03-30 DIAGNOSIS — Z79891 Long term (current) use of opiate analgesic: Secondary | ICD-10-CM

## 2018-03-30 DIAGNOSIS — E039 Hypothyroidism, unspecified: Secondary | ICD-10-CM | POA: Diagnosis present

## 2018-03-30 DIAGNOSIS — C771 Secondary and unspecified malignant neoplasm of intrathoracic lymph nodes: Secondary | ICD-10-CM | POA: Diagnosis present

## 2018-03-30 DIAGNOSIS — Z79890 Hormone replacement therapy (postmenopausal): Secondary | ICD-10-CM

## 2018-03-30 DIAGNOSIS — E870 Hyperosmolality and hypernatremia: Secondary | ICD-10-CM | POA: Diagnosis not present

## 2018-03-30 DIAGNOSIS — J80 Acute respiratory distress syndrome: Secondary | ICD-10-CM | POA: Diagnosis not present

## 2018-03-30 DIAGNOSIS — K9423 Gastrostomy malfunction: Secondary | ICD-10-CM

## 2018-03-30 DIAGNOSIS — Z79899 Other long term (current) drug therapy: Secondary | ICD-10-CM

## 2018-03-30 DIAGNOSIS — Z87891 Personal history of nicotine dependence: Secondary | ICD-10-CM

## 2018-03-30 DIAGNOSIS — Z923 Personal history of irradiation: Secondary | ICD-10-CM

## 2018-03-30 NOTE — ED Notes (Signed)
Bed: RESA Expected date:  Expected time:  Means of arrival:  Comments: 84 yo M/Trach pt difficulty breathing

## 2018-03-31 ENCOUNTER — Inpatient Hospital Stay (HOSPITAL_COMMUNITY): Payer: Medicare Other

## 2018-03-31 ENCOUNTER — Ambulatory Visit
Admission: RE | Admit: 2018-03-31 | Discharge: 2018-03-31 | Disposition: A | Discharge status: Home / Self Care | Payer: Medicare Other | Source: Ambulatory Visit | Attending: Radiation Oncology | Admitting: Radiation Oncology

## 2018-03-31 ENCOUNTER — Emergency Department (HOSPITAL_COMMUNITY): Payer: Medicare Other

## 2018-03-31 ENCOUNTER — Other Ambulatory Visit: Payer: Self-pay | Admitting: Radiology

## 2018-03-31 ENCOUNTER — Other Ambulatory Visit: Payer: Self-pay

## 2018-03-31 ENCOUNTER — Encounter (HOSPITAL_COMMUNITY): Payer: Self-pay | Admitting: Emergency Medicine

## 2018-03-31 DIAGNOSIS — D62 Acute posthemorrhagic anemia: Secondary | ICD-10-CM

## 2018-03-31 DIAGNOSIS — J9601 Acute respiratory failure with hypoxia: Secondary | ICD-10-CM

## 2018-03-31 DIAGNOSIS — Z7189 Other specified counseling: Secondary | ICD-10-CM | POA: Diagnosis not present

## 2018-03-31 DIAGNOSIS — R531 Weakness: Secondary | ICD-10-CM | POA: Diagnosis not present

## 2018-03-31 DIAGNOSIS — M899 Disorder of bone, unspecified: Secondary | ICD-10-CM | POA: Diagnosis not present

## 2018-03-31 DIAGNOSIS — J690 Pneumonitis due to inhalation of food and vomit: Secondary | ICD-10-CM | POA: Diagnosis not present

## 2018-03-31 DIAGNOSIS — R404 Transient alteration of awareness: Secondary | ICD-10-CM | POA: Diagnosis not present

## 2018-03-31 DIAGNOSIS — C801 Malignant (primary) neoplasm, unspecified: Secondary | ICD-10-CM | POA: Diagnosis not present

## 2018-03-31 DIAGNOSIS — Z930 Tracheostomy status: Secondary | ICD-10-CM | POA: Diagnosis not present

## 2018-03-31 DIAGNOSIS — C329 Malignant neoplasm of larynx, unspecified: Secondary | ICD-10-CM | POA: Diagnosis not present

## 2018-03-31 DIAGNOSIS — C61 Malignant neoplasm of prostate: Secondary | ICD-10-CM | POA: Diagnosis not present

## 2018-03-31 DIAGNOSIS — R0600 Dyspnea, unspecified: Secondary | ICD-10-CM | POA: Diagnosis not present

## 2018-03-31 DIAGNOSIS — R279 Unspecified lack of coordination: Secondary | ICD-10-CM | POA: Diagnosis not present

## 2018-03-31 DIAGNOSIS — G9341 Metabolic encephalopathy: Secondary | ICD-10-CM | POA: Diagnosis present

## 2018-03-31 DIAGNOSIS — N390 Urinary tract infection, site not specified: Secondary | ICD-10-CM | POA: Diagnosis present

## 2018-03-31 DIAGNOSIS — Z8546 Personal history of malignant neoplasm of prostate: Secondary | ICD-10-CM | POA: Diagnosis not present

## 2018-03-31 DIAGNOSIS — R918 Other nonspecific abnormal finding of lung field: Secondary | ICD-10-CM

## 2018-03-31 DIAGNOSIS — Z4682 Encounter for fitting and adjustment of non-vascular catheter: Secondary | ICD-10-CM | POA: Diagnosis not present

## 2018-03-31 DIAGNOSIS — E870 Hyperosmolality and hypernatremia: Secondary | ICD-10-CM | POA: Diagnosis not present

## 2018-03-31 DIAGNOSIS — I10 Essential (primary) hypertension: Secondary | ICD-10-CM | POA: Diagnosis present

## 2018-03-31 DIAGNOSIS — Z789 Other specified health status: Secondary | ICD-10-CM | POA: Diagnosis not present

## 2018-03-31 DIAGNOSIS — E46 Unspecified protein-calorie malnutrition: Secondary | ICD-10-CM | POA: Diagnosis not present

## 2018-03-31 DIAGNOSIS — J9600 Acute respiratory failure, unspecified whether with hypoxia or hypercapnia: Secondary | ICD-10-CM | POA: Diagnosis not present

## 2018-03-31 DIAGNOSIS — Z85819 Personal history of malignant neoplasm of unspecified site of lip, oral cavity, and pharynx: Secondary | ICD-10-CM | POA: Diagnosis not present

## 2018-03-31 DIAGNOSIS — Z8521 Personal history of malignant neoplasm of larynx: Secondary | ICD-10-CM | POA: Diagnosis not present

## 2018-03-31 DIAGNOSIS — C3490 Malignant neoplasm of unspecified part of unspecified bronchus or lung: Secondary | ICD-10-CM | POA: Diagnosis not present

## 2018-03-31 DIAGNOSIS — C7951 Secondary malignant neoplasm of bone: Secondary | ICD-10-CM

## 2018-03-31 DIAGNOSIS — R1310 Dysphagia, unspecified: Secondary | ICD-10-CM | POA: Diagnosis not present

## 2018-03-31 DIAGNOSIS — Z515 Encounter for palliative care: Secondary | ICD-10-CM | POA: Diagnosis present

## 2018-03-31 DIAGNOSIS — R1319 Other dysphagia: Secondary | ICD-10-CM | POA: Diagnosis present

## 2018-03-31 DIAGNOSIS — Z743 Need for continuous supervision: Secondary | ICD-10-CM | POA: Diagnosis not present

## 2018-03-31 DIAGNOSIS — J309 Allergic rhinitis, unspecified: Secondary | ICD-10-CM | POA: Diagnosis present

## 2018-03-31 DIAGNOSIS — R109 Unspecified abdominal pain: Secondary | ICD-10-CM

## 2018-03-31 DIAGNOSIS — R64 Cachexia: Secondary | ICD-10-CM | POA: Diagnosis present

## 2018-03-31 DIAGNOSIS — K219 Gastro-esophageal reflux disease without esophagitis: Secondary | ICD-10-CM | POA: Diagnosis present

## 2018-03-31 DIAGNOSIS — R0902 Hypoxemia: Secondary | ICD-10-CM | POA: Diagnosis not present

## 2018-03-31 DIAGNOSIS — J189 Pneumonia, unspecified organism: Secondary | ICD-10-CM | POA: Diagnosis not present

## 2018-03-31 DIAGNOSIS — E039 Hypothyroidism, unspecified: Secondary | ICD-10-CM | POA: Diagnosis present

## 2018-03-31 DIAGNOSIS — R491 Aphonia: Secondary | ICD-10-CM | POA: Diagnosis not present

## 2018-03-31 DIAGNOSIS — C3432 Malignant neoplasm of lower lobe, left bronchus or lung: Secondary | ICD-10-CM | POA: Diagnosis present

## 2018-03-31 DIAGNOSIS — K922 Gastrointestinal hemorrhage, unspecified: Secondary | ICD-10-CM | POA: Diagnosis present

## 2018-03-31 DIAGNOSIS — G893 Neoplasm related pain (acute) (chronic): Secondary | ICD-10-CM | POA: Diagnosis not present

## 2018-03-31 DIAGNOSIS — C781 Secondary malignant neoplasm of mediastinum: Secondary | ICD-10-CM | POA: Diagnosis present

## 2018-03-31 DIAGNOSIS — R000 Tachycardia, unspecified: Secondary | ICD-10-CM | POA: Diagnosis present

## 2018-03-31 DIAGNOSIS — Y95 Nosocomial condition: Secondary | ICD-10-CM | POA: Diagnosis present

## 2018-03-31 DIAGNOSIS — R042 Hemoptysis: Secondary | ICD-10-CM | POA: Diagnosis present

## 2018-03-31 DIAGNOSIS — E440 Moderate protein-calorie malnutrition: Secondary | ICD-10-CM | POA: Diagnosis present

## 2018-03-31 DIAGNOSIS — J181 Lobar pneumonia, unspecified organism: Secondary | ICD-10-CM | POA: Diagnosis not present

## 2018-03-31 DIAGNOSIS — J90 Pleural effusion, not elsewhere classified: Secondary | ICD-10-CM | POA: Diagnosis not present

## 2018-03-31 DIAGNOSIS — J984 Other disorders of lung: Secondary | ICD-10-CM | POA: Diagnosis not present

## 2018-03-31 DIAGNOSIS — C771 Secondary and unspecified malignant neoplasm of intrathoracic lymph nodes: Secondary | ICD-10-CM | POA: Diagnosis present

## 2018-03-31 DIAGNOSIS — Z66 Do not resuscitate: Secondary | ICD-10-CM | POA: Diagnosis present

## 2018-03-31 LAB — CBC WITH DIFFERENTIAL/PLATELET
BASOS ABS: 0 10*3/uL (ref 0.0–0.1)
BASOS PCT: 0 %
Basophils Absolute: 0 10*3/uL (ref 0.0–0.1)
Basophils Relative: 0 %
EOS ABS: 0 10*3/uL (ref 0.0–0.7)
Eosinophils Absolute: 0 10*3/uL (ref 0.0–0.7)
Eosinophils Relative: 0 %
Eosinophils Relative: 0 %
HEMATOCRIT: 35.1 % — AB (ref 39.0–52.0)
HEMATOCRIT: 35.3 % — AB (ref 39.0–52.0)
HEMOGLOBIN: 11 g/dL — AB (ref 13.0–17.0)
Hemoglobin: 11 g/dL — ABNORMAL LOW (ref 13.0–17.0)
LYMPHS ABS: 0.8 10*3/uL (ref 0.7–4.0)
LYMPHS PCT: 5 %
Lymphocytes Relative: 3 %
Lymphs Abs: 0.5 10*3/uL — ABNORMAL LOW (ref 0.7–4.0)
MCH: 26.5 pg (ref 26.0–34.0)
MCH: 26.8 pg (ref 26.0–34.0)
MCHC: 31.2 g/dL (ref 30.0–36.0)
MCHC: 31.3 g/dL (ref 30.0–36.0)
MCV: 85.1 fL (ref 78.0–100.0)
MCV: 85.4 fL (ref 78.0–100.0)
MONO ABS: 0.7 10*3/uL (ref 0.1–1.0)
MONOS PCT: 3 %
Monocytes Absolute: 0.5 10*3/uL (ref 0.1–1.0)
Monocytes Relative: 4 %
NEUTROS ABS: 15.4 10*3/uL — AB (ref 1.7–7.7)
NEUTROS ABS: 16 10*3/uL — AB (ref 1.7–7.7)
NEUTROS PCT: 92 %
NEUTROS PCT: 93 %
Platelets: 274 10*3/uL (ref 150–400)
Platelets: 329 10*3/uL (ref 150–400)
RBC: 4.11 MIL/uL — ABNORMAL LOW (ref 4.22–5.81)
RBC: 4.15 MIL/uL — ABNORMAL LOW (ref 4.22–5.81)
RDW: 14.2 % (ref 11.5–15.5)
RDW: 14.4 % (ref 11.5–15.5)
WBC: 16.6 10*3/uL — ABNORMAL HIGH (ref 4.0–10.5)
WBC: 17.4 10*3/uL — ABNORMAL HIGH (ref 4.0–10.5)

## 2018-03-31 LAB — URINALYSIS, ROUTINE W REFLEX MICROSCOPIC
Bacteria, UA: NONE SEEN
Bilirubin Urine: NEGATIVE
Glucose, UA: NEGATIVE mg/dL
Ketones, ur: 20 mg/dL — AB
Leukocytes, UA: NEGATIVE
Nitrite: NEGATIVE
Protein, ur: 30 mg/dL — AB
Specific Gravity, Urine: 1.024 (ref 1.005–1.030)
pH: 5 (ref 5.0–8.0)

## 2018-03-31 LAB — COMPREHENSIVE METABOLIC PANEL WITH GFR
ALT: 40 U/L (ref 0–44)
AST: 52 U/L — ABNORMAL HIGH (ref 15–41)
Albumin: 2.5 g/dL — ABNORMAL LOW (ref 3.5–5.0)
Alkaline Phosphatase: 141 U/L — ABNORMAL HIGH (ref 38–126)
Anion gap: 13 (ref 5–15)
BUN: 21 mg/dL (ref 8–23)
CO2: 30 mmol/L (ref 22–32)
Calcium: 9.4 mg/dL (ref 8.9–10.3)
Chloride: 102 mmol/L (ref 98–111)
Creatinine, Ser: 0.8 mg/dL (ref 0.61–1.24)
GFR calc Af Amer: >60 mL/min
GFR calc non Af Amer: >60 mL/min
Glucose, Bld: 132 mg/dL — ABNORMAL HIGH (ref 70–99)
Potassium: 3 mmol/L — ABNORMAL LOW (ref 3.5–5.1)
Sodium: 145 mmol/L (ref 135–145)
Total Bilirubin: 1 mg/dL (ref 0.3–1.2)
Total Protein: 6.2 g/dL — ABNORMAL LOW (ref 6.5–8.1)

## 2018-03-31 LAB — STREP PNEUMONIAE URINARY ANTIGEN: STREP PNEUMO URINARY ANTIGEN: NEGATIVE

## 2018-03-31 LAB — BRAIN NATRIURETIC PEPTIDE: B Natriuretic Peptide: 94 pg/mL (ref 0.0–100.0)

## 2018-03-31 LAB — SAMPLE TO BLOOD BANK

## 2018-03-31 LAB — LACTIC ACID, PLASMA
Lactic Acid, Venous: 1.3 mmol/L (ref 0.5–1.9)
Lactic Acid, Venous: 1.5 mmol/L (ref 0.5–1.9)

## 2018-03-31 LAB — PROTIME-INR
INR: 1.4
Prothrombin Time: 17.1 seconds — ABNORMAL HIGH (ref 11.4–15.2)

## 2018-03-31 LAB — APTT: APTT: 37 s — AB (ref 24–36)

## 2018-03-31 LAB — PROCALCITONIN: PROCALCITONIN: 7.4 ng/mL

## 2018-03-31 MED ORDER — ACETAMINOPHEN 650 MG RE SUPP
650.0000 mg | Freq: Four times a day (QID) | RECTAL | Status: DC | PRN
  Administered 2018-03-31: 650 mg via RECTAL
  Filled 2018-03-31: qty 1

## 2018-03-31 MED ORDER — PANTOPRAZOLE SODIUM 40 MG PO TBEC
40.0000 mg | DELAYED_RELEASE_TABLET | Freq: Two times a day (BID) | ORAL | Status: DC
  Administered 2018-03-31 – 2018-04-02 (×4): 40 mg via ORAL
  Filled 2018-03-31 (×5): qty 1

## 2018-03-31 MED ORDER — CEFEPIME HCL 1 G IJ SOLR
1.0000 g | Freq: Three times a day (TID) | INTRAMUSCULAR | Status: DC
  Filled 2018-03-31 (×2): qty 1

## 2018-03-31 MED ORDER — POLYETHYLENE GLYCOL 3350 17 G PO PACK
17.0000 g | PACK | Freq: Two times a day (BID) | ORAL | Status: DC
  Administered 2018-03-31: 17 g via ORAL
  Filled 2018-03-31: qty 1

## 2018-03-31 MED ORDER — IOPAMIDOL (ISOVUE-370) INJECTION 76%
100.0000 mL | Freq: Once | INTRAVENOUS | Status: AC | PRN
  Administered 2018-03-31: 100 mL via INTRAVENOUS

## 2018-03-31 MED ORDER — PIPERACILLIN-TAZOBACTAM 3.375 G IVPB
3.3750 g | Freq: Once | INTRAVENOUS | Status: AC
  Administered 2018-03-31: 3.375 g via INTRAVENOUS
  Filled 2018-03-31: qty 50

## 2018-03-31 MED ORDER — SODIUM CHLORIDE 0.9 % IV BOLUS
500.0000 mL | Freq: Once | INTRAVENOUS | Status: AC
  Administered 2018-03-31: 500 mL via INTRAVENOUS

## 2018-03-31 MED ORDER — MORPHINE SULFATE ER 30 MG PO TBCR: 30.0000 mg | EXTENDED_RELEASE_TABLET | Freq: Two times a day (BID) | ORAL | Status: DC

## 2018-03-31 MED ORDER — PIPERACILLIN-TAZOBACTAM 3.375 G IVPB
3.3750 g | Freq: Three times a day (TID) | INTRAVENOUS | Status: DC
  Administered 2018-03-31 – 2018-04-02 (×6): 3.375 g via INTRAVENOUS
  Filled 2018-03-31 (×7): qty 50

## 2018-03-31 MED ORDER — VANCOMYCIN HCL IN DEXTROSE 1-5 GM/200ML-% IV SOLN
1000.0000 mg | Freq: Once | INTRAVENOUS | Status: AC
  Administered 2018-03-31: 1000 mg via INTRAVENOUS
  Filled 2018-03-31: qty 200

## 2018-03-31 MED ORDER — MORPHINE SULFATE ER 30 MG PO TBCR
30.0000 mg | EXTENDED_RELEASE_TABLET | Freq: Two times a day (BID) | ORAL | Status: DC
  Administered 2018-03-31 (×2): 30 mg via ORAL
  Filled 2018-03-31 (×2): qty 1

## 2018-03-31 MED ORDER — PANTOPRAZOLE SODIUM 40 MG PO TBEC: 40.0000 mg | DELAYED_RELEASE_TABLET | Freq: Every day | ORAL | Status: DC

## 2018-03-31 MED ORDER — ONDANSETRON HCL 4 MG/2ML IJ SOLN: 4.0000 mg | Freq: Four times a day (QID) | INTRAMUSCULAR | Status: DC | PRN

## 2018-03-31 MED ORDER — SODIUM CHLORIDE 0.9 % IV BOLUS
1000.0000 mL | Freq: Once | INTRAVENOUS | Status: AC
  Administered 2018-03-31: 1000 mL via INTRAVENOUS

## 2018-03-31 MED ORDER — OXYCODONE HCL 5 MG PO TABS
10.0000 mg | ORAL_TABLET | ORAL | Status: DC | PRN
  Administered 2018-04-01 (×2): 10 mg via ORAL
  Filled 2018-03-31 (×2): qty 2

## 2018-03-31 MED ORDER — ACETAMINOPHEN 650 MG RE SUPP: 650.0000 mg | RECTAL | Status: DC | PRN

## 2018-03-31 MED ORDER — ACETAMINOPHEN 325 MG PO TABS
650.0000 mg | ORAL_TABLET | Freq: Four times a day (QID) | ORAL | Status: DC | PRN
Start: 2018-03-31 — End: 2018-04-16
  Administered 2018-04-04 – 2018-04-14 (×3): 650 mg via ORAL
  Filled 2018-03-31 (×4): qty 2

## 2018-03-31 MED ORDER — TAMSULOSIN HCL 0.4 MG PO CAPS
0.4000 mg | ORAL_CAPSULE | Freq: Every day | ORAL | Status: DC
  Administered 2018-03-31 – 2018-04-15 (×10): 0.4 mg via ORAL
  Filled 2018-03-31 (×10): qty 1

## 2018-03-31 MED ORDER — POLYETHYLENE GLYCOL 3350 17 G PO PACK: 17.0000 g | PACK | Freq: Every day | ORAL | Status: DC

## 2018-03-31 MED ORDER — SODIUM CHLORIDE 0.9 % IV SOLN
INTRAVENOUS | Status: DC
  Administered 2018-03-31 – 2018-04-01 (×2): via INTRAVENOUS

## 2018-03-31 MED ORDER — IOPAMIDOL (ISOVUE-370) INJECTION 76%
INTRAVENOUS | Status: AC
  Filled 2018-03-31: qty 100

## 2018-03-31 MED ORDER — SODIUM CHLORIDE 0.9 % IV SOLN
8.0000 mg/h | INTRAVENOUS | Status: DC
  Administered 2018-03-31: 8 mg/h via INTRAVENOUS
  Filled 2018-03-31: qty 80

## 2018-03-31 MED ORDER — MORPHINE 100MG IN NS 100ML (1MG/ML) PREMIX INFUSION
1.0000 mg/h | INTRAVENOUS | Status: DC
  Administered 2018-03-31: 1 mg/h via INTRAVENOUS
  Filled 2018-03-31: qty 100

## 2018-03-31 MED ORDER — POTASSIUM CHLORIDE 20 MEQ/15ML (10%) PO SOLN
20.0000 meq | Freq: Two times a day (BID) | ORAL | Status: DC
  Administered 2018-03-31 – 2018-04-05 (×9): 20 meq via ORAL
  Filled 2018-03-31 (×10): qty 15

## 2018-03-31 MED ORDER — HYDROMORPHONE HCL 1 MG/ML IJ SOLN
1.0000 mg | Freq: Once | INTRAMUSCULAR | Status: AC
Start: 2018-03-31 — End: 2018-03-31
  Administered 2018-03-31: 1 mg via INTRAVENOUS
  Filled 2018-03-31: qty 1

## 2018-03-31 MED ORDER — LORAZEPAM 2 MG/ML IJ SOLN
0.5000 mg | INTRAMUSCULAR | Status: DC | PRN
  Administered 2018-04-04 – 2018-04-06 (×2): 0.5 mg via INTRAVENOUS
  Filled 2018-03-31 (×2): qty 1

## 2018-03-31 MED ORDER — PANTOPRAZOLE SODIUM 40 MG IV SOLR
80.0000 mg | Freq: Once | INTRAVENOUS | Status: AC
  Administered 2018-03-31: 80 mg via INTRAVENOUS
  Filled 2018-03-31: qty 80

## 2018-03-31 MED ORDER — SENNOSIDES-DOCUSATE SODIUM 8.6-50 MG PO TABS: 1.0000 | ORAL_TABLET | Freq: Two times a day (BID) | ORAL | Status: DC | PRN

## 2018-03-31 MED ORDER — PANTOPRAZOLE SODIUM 40 MG IV SOLR: 40.0000 mg | Freq: Two times a day (BID) | INTRAVENOUS | Status: DC

## 2018-03-31 MED ORDER — ONDANSETRON HCL 4 MG PO TABS: 4.0000 mg | ORAL_TABLET | Freq: Four times a day (QID) | ORAL | Status: DC | PRN

## 2018-03-31 MED ORDER — HYOSCYAMINE SULFATE 0.125 MG/ML PO SOLN
0.2500 mg | ORAL | Status: DC | PRN
  Filled 2018-03-31: qty 2

## 2018-03-31 MED ORDER — ACETAMINOPHEN 325 MG PO TABS: 650.0000 mg | ORAL_TABLET | Freq: Four times a day (QID) | ORAL | Status: DC | PRN

## 2018-03-31 MED ORDER — VANCOMYCIN HCL 10 G IV SOLR
1250.0000 mg | INTRAVENOUS | Status: DC
  Administered 2018-03-31 – 2018-04-01 (×2): 1250 mg via INTRAVENOUS
  Filled 2018-03-31 (×2): qty 1250

## 2018-03-31 MED ORDER — DEXAMETHASONE 4 MG PO TABS
4.0000 mg | ORAL_TABLET | Freq: Two times a day (BID) | ORAL | Status: DC
  Administered 2018-03-31 – 2018-04-05 (×10): 4 mg via ORAL
  Filled 2018-03-31 (×10): qty 1

## 2018-03-31 NOTE — H&P (Signed)
History and Physical    [name] POE:[id number] DOB: May 27, 1931 DOA: 03/30/2018  PCP: Biagio Borg, MD  Patient coming from: Home.  History obtained from patient's daughter and wife at bedside.  Patient is weak confused.  Chief Complaint: Weakness difficulty breathing.  HPI: [name] is a 84 y.o. male with history of laryngeal carcinoma status post tracheostomy and previous history of prostate cancer was recently diagnosed with possible metastatic lung cancer to the spine and was originally planned to have radiation therapy today was brought to the ER last evening after patient was found to be progressively very weak unable to walk and was found to be short of breath with increased secretions on tracheal tube.  Patient has been in poor appetite last few days and also was recently placed on steroids.  Patient also noticed some black stools.  Has not complained of any chest pain abdominal pain.  ED Course: In the ER patient was febrile tachycardic hypoxic and adequate 100% nonrebreather.  CT angiogram of the chest shows bilateral consolidation.  Following the CT scan patient had increased secretions coming out of the tracheal stoma and NG tube placed shows frank blood aspirated.  Patient became more hypoxic desaturating on 100% nonrebreather.  Plan was initially to intubate patient and anesthetist was called in.  But after further discussion with family including patient's wife and patient's daughter patient was made complete comfort measures and morphine drip started.  As per the family no further tests.  Review of Systems: As per HPI, rest all negative.   Past Medical History:  Diagnosis Date  . Cataract, immature   . Esophageal stenosis 05/2016  . Full dentures   . GERD (gastroesophageal reflux disease)   . History of cancer of larynx 2009  . History of prostate cancer 2015  . History of radiation therapy 2015   for prostate cancer  . Hypertension    states under control  with meds., has been on med. x 5-6 yr.  . 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 CO2 LASER APPLICATION N/A 11/29/4006   Procedure: CO2 LASER APPLICATION;  Surgeon: Rozetta Nunnery, MD;  Location: [place];  Service: ENT;  Laterality: N/A;  . CO2 LASER APPLICATION N/A 6/76/1950   Procedure: CO2 LASER APPLICATION;  Surgeon: Rozetta Nunnery, MD;  Location: [place];  Service: ENT;  Laterality: N/A;  . DIRECT LARYNGOSCOPY  04/10/2007; 02/23/2007   with bx.  . ESOPHAGEAL DILATION  06/24/2008; 12/04/2009; 12/06/2010; 01/10/2012  . ESOPHAGEAL DILATION N/A 10/12/2013   Procedure: ESOPHAGEAL DILATION;  Surgeon: Rozetta Nunnery, MD;  Location: [facility];  Service: ENT;  Laterality: N/A;  . ESOPHAGOSCOPY WITH DILITATION  11/29/2014   Procedure: ESOPHAGOSCOPY WITH DILITATION;  Surgeon: Rozetta Nunnery, MD;  Location: [place];  Service: ENT;;  . LIPOMA EXCISION     abd.   Marland Kitchen MASS EXCISION  01/10/2012   Procedure: EXCISION MASS;  Surgeon: Rozetta Nunnery, MD;  Location: [place];  Service: ENT;  Laterality: N/A;  posterior neck mass  . NECK SURGERY  01/10/2012   tumor removed from back of neck  . PLACEMENT OF TRACHEAL ESOPHAGEAL PROTHESIS, ESOPHAGOSCOPY WITH DILATION  04/07/2008  . PLACEMENT OF TRACHEAL ESOPHAGEAL PROTHESIS, ESOPHAGOSCOPY WITH DILATION N/A 06/14/2016   Procedure: REVISION OF TRACHEAL STOMA WITH CO2 LASER, CHANGE OF TRACHEAL ESOPHAGEAL PROSTHESIS, ESOPHAGOSCOPY WITH DILATION;  Surgeon: Rozetta Nunnery,  MD;  Location: [place];  Service: ENT;  Laterality: N/A;  . PROSTATE BIOPSY  04/29/2005  . TONSILLECTOMY    . TOTAL LARYNGECTOMY  10/28/2007  . TRACHEAL ESOPHAGEAL PROSTHESIS (TEP) CHANGE  12/04/2009; 12/06/2010  . TRACHEOSTOMY REVISION N/A 10/12/2013   Procedure: TRACHEOSTOMA  REVSION w/ CO2 LASER;  Surgeon: Rozetta Nunnery, MD;  Location: [place];  Service: ENT;  Laterality: N/A;  . TRACHEOSTOMY REVISION N/A 11/29/2014   Procedure: tracheal esophageal prosthesis change ;  Surgeon: Rozetta Nunnery, MD;  Location: [place] [facility];  Service: ENT;  Laterality: N/A;     reports that he quit smoking about 31 years ago. He quit after 25.00 years of use. He has never used smokeless tobacco. He reports that he does not drink alcohol or use drugs.  Allergies  Allergen Reactions  . Terazosin Other (See Comments)    DIZZINESS  . Levothyroxine Other (See Comments)    Family History  Problem Relation Age of Onset  . Hypertension Father   . Heart attack Father 68  . Diabetes Brother   . Diabetes Brother   . Colon cancer Neg Hx     Prior to Admission medications   Medication Sig Start Date End Date Taking? Authorizing Provider  amLODipine (NORVASC) 5 MG tablet Take 5 mg by mouth daily.    Yes [provider]  aspirin 81 MG tablet Take 81 mg by mouth daily.   Yes [provider]  HYDROcodone-acetaminophen (NORCO) 7.5-325 MG tablet Take 1 tablet by mouth every 6 (six) hours as needed for moderate pain. 03/10/18  Yes Biagio Borg, MD  lisinopril (PRINIVIL,ZESTRIL) 40 MG tablet Take 40 mg by mouth daily.    Yes [provider]  morphine (MS CONTIN) 30 MG 12 hr tablet Take 1 tablet (30 mg total) by mouth every 12 (twelve) hours. 03/25/18  Yes Gery Pray, MD  Multiple Vitamins-Minerals (MULTIVITAMIN ADULT PO) Take by mouth.   Yes [provider]  senna-docusate (SENNA S) 8.6-50 MG tablet Take 1 tablet by mouth 2 (two) times daily. Patient taking differently: Take 1 tablet by mouth 2 (two) times daily as needed for mild constipation.  03/24/18  Yes [name], MD  tamsulosin (FLOMAX) 0.4 MG CAPS capsule Take 1 capsule (0.4 mg total) by mouth daily. 11/03/16  Yes Shary Decamp, PA-C  Vitamin D, Ergocalciferol, (DRISDOL) 50000 units CAPS capsule Take  50,000 Units by mouth every 7 (seven) days.   Yes [provider]  dexamethasone (DECADRON) 4 MG tablet Take 1 tablet (4 mg total) by mouth 2 (two) times daily with a meal. Patient not taking: Reported on 03/31/2018 03/26/18   Gery Pray, MD  oxyCODONE (OXY IR/ROXICODONE) 5 MG immediate release tablet Take 1 tablet (5 mg total) by mouth every 4 (four) hours as needed for severe pain. Patient not taking: Reported on 03/25/2018 03/24/18   [name], MD  pantoprazole (PROTONIX) 40 MG tablet Take 40 mg by mouth daily.    [provider]  predniSONE (DELTASONE) 10 MG tablet 3 tabs by mouth per day for 3 days,2tabs per day for 3 days,21tab per day for 3 days Patient not taking: Reported on 03/25/2018 02/12/18   Biagio Borg, MD  ranitidine (ZANTAC) 150 MG capsule Take 150 mg by mouth every evening.     [provider]  tiZANidine (ZANAFLEX) 2 MG tablet Take 1 tablet (2 mg total) by mouth every 6 (six) hours as  needed for muscle spasms. Patient not taking: Reported on 03/25/2018 02/12/18   Biagio Borg, MD    Physical Exam: Vitals:   03/31/18 0308 03/31/18 0424 03/31/18 0435 03/31/18 0500  BP: (!) 175/76 (!) 176/66  134/62  Pulse: (!) 131 (!) 135  (!) 122  Resp: (!) 22 20  20   Temp:   (!) 103.9 F (39.9 C)   TempSrc:   Rectal   SpO2: 93% 90%  93%      Constitutional: Moderately built and nourished. Vitals:   03/31/18 0308 03/31/18 0424 03/31/18 0435 03/31/18 0500  BP: (!) 175/76 (!) 176/66  134/62  Pulse: (!) 131 (!) 135  (!) 122  Resp: (!) 22 20  20   Temp:   (!) 103.9 F (39.9 C)   TempSrc:   Rectal   SpO2: 93% 90%  93%   Eyes: Anicteric no pallor. ENMT: No discharge from the ears eyes nose or mouth. Neck: Tracheal tube has mild discharge coming out looks bloody. Respiratory: Coarse crepitations bilaterally. Cardiovascular: S1-S2 heard no murmurs appreciated. Abdomen: Soft nontender bowel sounds present. Musculoskeletal: No edema. Skin: No  rash. Neurologic: Patient appears mildly confused but easily arousable and follows commands.  Moves all extremities. Psychiatric: Appears mildly confused.   Labs on Admission: I have personally reviewed following labs and imaging studies  CBC: Recent Labs  Lab 03/31/18 0010  WBC 17.4*  NEUTROABS 16.0*  HGB 11.0*  HCT 35.1*  MCV 85.4  PLT 062   Basic Metabolic Panel: Recent Labs  Lab 03/31/18 0010  NA 145  K 3.0*  CL 102  CO2 30  GLUCOSE 132*  BUN 21  CREATININE 0.80  CALCIUM 9.4   GFR: Estimated Creatinine Clearance: 67.5 mL/min (by C-G formula based on SCr of 0.8 mg/dL). Liver Function Tests: Recent Labs  Lab 03/31/18 0010  AST 52*  ALT 40  ALKPHOS 141*  BILITOT 1.0  PROT 6.2*  ALBUMIN 2.5*   No results for input(s): LIPASE, AMYLASE in the last 168 hours. No results for input(s): AMMONIA in the last 168 hours. Coagulation Profile: No results for input(s): INR, PROTIME in the last 168 hours. Cardiac Enzymes: No results for input(s): CKTOTAL, CKMB, CKMBINDEX, TROPONINI in the last 168 hours. BNP (last 3 results) No results for input(s): PROBNP in the last 8760 hours. HbA1C: No results for input(s): HGBA1C in the last 72 hours. CBG: No results for input(s): GLUCAP in the last 168 hours. Lipid Profile: No results for input(s): CHOL, HDL, LDLCALC, TRIG, CHOLHDL, LDLDIRECT in the last 72 hours. Thyroid Function Tests: No results for input(s): TSH, T4TOTAL, FREET4, T3FREE, THYROIDAB in the last 72 hours. Anemia Panel: No results for input(s): VITAMINB12, FOLATE, FERRITIN, TIBC, IRON, RETICCTPCT in the last 72 hours. Urine analysis:    Component Value Date/Time   COLORURINE YELLOW 03/31/2018 0141   APPEARANCEUR CLEAR 03/31/2018 0141   LABSPEC 1.024 03/31/2018 0141   PHURINE 5.0 03/31/2018 0141   GLUCOSEU NEGATIVE 03/31/2018 0141   GLUCOSEU NEGATIVE 03/10/2018 1140   HGBUR SMALL (A) 03/31/2018 0141   BILIRUBINUR NEGATIVE 03/31/2018 0141   KETONESUR 20  (A) 03/31/2018 0141   PROTEINUR 30 (A) 03/31/2018 0141   UROBILINOGEN 1.0 03/10/2018 1140   NITRITE NEGATIVE 03/31/2018 0141   LEUKOCYTESUR NEGATIVE 03/31/2018 0141   Sepsis Labs: @LABRCNTIP (procalcitonin:4,lacticidven:4) )No results found for this or any previous visit (from the past 240 hour(s)).   Radiological Exams on Admission: Dg Chest 1 View  Result Date: 03/31/2018 CLINICAL DATA:  84 year old male with  weakness.  Tracheostomy. EXAM: CHEST  1 VIEW COMPARISON:  PET-CT dated 03/17/2018 FINDINGS: A tracheostomy is noted. Bilateral mid to lower lung field densities likely combination of pleural effusions and associated atelectatic changes. Pneumonia is not excluded. Clinical correlation is recommended. The left lower lobe mass seen on the prior PET-CT is not well visualized on this radiograph. There is no pneumothorax. The cardiac silhouette is within normal limits. No acute osseous pathology. IMPRESSION: Bilateral pleural effusions with associated atelectatic changes of the lower lobes. Pneumonia is not excluded. Clinical correlation is recommended. Electronically Signed   By: Anner Crete M.D.   On: 03/31/2018 01:26   Dg Abdomen 1 View  Result Date: 03/31/2018 CLINICAL DATA:  84 year old male status post NG tube placement. EXAM: ABDOMEN - 1 VIEW COMPARISON:  Chest CTA 0400 hours today. FINDINGS: Portable AP supine view at 0433 hours. Enteric tube courses from the mediastinum to the epigastrium. Side hole is at the level of the gastroesophageal junction. Stable visible lung bases. Excreted IV contrast in nondilated bilateral renal collecting systems. Visualized bowel gas pattern is non obstructed. No acute osseous abnormality identified. IMPRESSION: 1. Enteric tube courses to the stomach. Side hole is at the GEJ level, advance 5 cm to ensure side hole placement within the stomach. 2. Nonobstructed visible bowel gas pattern. Nonobstructed kidneys with IV contrast excretion. Electronically  Signed   By: Genevie Ann M.D.   On: 03/31/2018 05:09   Ct Angio Chest Pe W Or Wo Contrast  Result Date: 03/31/2018 CLINICAL DATA:  84 year old male with dyspnea. EXAM: CT ANGIOGRAPHY CHEST WITH CONTRAST TECHNIQUE: Multidetector CT imaging of the chest was performed using the standard protocol during bolus administration of intravenous contrast. Multiplanar CT image reconstructions and MIPs were obtained to evaluate the vascular anatomy. CONTRAST:  142mL ISOVUE-370 IOPAMIDOL (ISOVUE-370) INJECTION 76% COMPARISON:  Chest radiograph dated 03/31/2018 and PET-CT dated 03/17/2018. FINDINGS: Cardiovascular: There is no cardiomegaly or pericardial effusion. The thoracic aorta is unremarkable. Probable diminutive left vertebral artery. The remainder of the visualized origins of the great vessels of the aortic arch appear patent. Evaluation of the pulmonary arteries is very limited due to respiratory motion artifact as well as streak artifact caused by patient's arms. No large or central pulmonary artery embolus identified. Mediastinum/Nodes: Bilateral hilar adenopathy measure 15 mm on the right approximately 15 mm on the left. A 13 mm rounded prevascular lymph node noted. Esophagus is grossly unremarkable. No mediastinal fluid collection. Lungs/Pleura: There is a small left pleural effusion. There is near complete consolidative changes of the left lower lobe as well as the large area of consolidative change with air bronchogram in the right lower lobe. Additional airspace density in the right upper lobe. These consolidative areas are new compared to the prior CT most consistent with multifocal pneumonia. The previously seen left lower lobe mass is not well visualized due to consolidative changes of the left lower lobe. There is a background of emphysema. There is a 4 mm left upper lobe subpleural nodule. There is no pneumothorax. A tracheostomy is noted. There is high-grade narrowing of the left lower lobe bronchus as well  as narrowing of the right lower lobe bronchus. This is likely related to mass effect caused by hilar adenopathy. Upper Abdomen: Right renal upper pole cyst. The visualized upper abdomen is otherwise unremarkable. Musculoskeletal: T8 sclerotic lesion seen on the PET-CT. There is osteopenia. Scattered lytic lesions involving T6 with associated mild compression of the superior endplate consistent with a pathologic fracture. There is lytic  metastatic disease involving T11 and T12 as well as sternum. These lesions are better seen on the prior PET-CT. Review of the MIP images confirms the above findings. IMPRESSION: 1. Very limited evaluation of the pulmonary arteries due to respiratory motion artifact. No large or central pulmonary artery embolus. 2. Large areas of consolidation involving the lower lobes as well as right upper lobe most consistent with pneumonia. There is high-grade narrowing of the left lower lobe bronchus as well as narrowing of the right lower lobe bronchus likely related to mass effect caused by hilar adenopathy. 3. Small left pleural effusion increased in size compared to the PET-CT of 03/17/2018. 4. Multiple osseous lytic lesions with minimally depressed pathology fracture of the superior endplate of T6. 5.  Emphysema (ICD10-J43.9). Electronically Signed   By: Anner Crete M.D.   On: 03/31/2018 04:47    EKG: Independently reviewed.  Sinus tachycardia.  Assessment/Plan Principal Problem:   Acute respiratory failure with hypoxia (HCC) Active Problems:   Essential hypertension   Hypothyroidism   Metastatic cancer to bone (HCC)   Acute GI bleeding   Acute blood loss anemia   Acute respiratory failure ([place])    1. Acute respiratory failure with hypoxia likely secondary to aspiration pneumonia and patient's progressive metastasis. 2. Acute GI bleeding. 3. Acute blood loss anemia. 4. Likely developing sepsis from pneumonia. 5. Acute encephalopathy likely from sepsis and metabolic  reasons. 6. Recently diagnosed metastatic cancer likely from bronchogenic origin.  Has mets to the spine. 7. Hypothyroidism. 8. History of hypertension. 9. History of laryngeal cancer status post tracheostomy. 10. Previous history of prostate cancer in remission. 11. Severe protein calorie malnutrition.  Plan -after lengthy discussion with patient's wife and daughter at the bedside the family has decided that patient will be made full comfort measures and morphine drip started.  No further test as requested by patient's family and patient is DNR.  Palliative team consulted.  Please notify Dr. Alen Blew patient's oncologist.   DVT prophylaxis: SCDs. Code Status: DNR. Family Communication: Patient's wife and daughter. Disposition Plan: To be determined.  Likely terminal of this admission. Consults called: Palliative care. Admission status: Inpatient.   Rise Patience MD [facility] Pager [phone].  If 7PM-7AM, please contact night-coverage [email or web address] Password [email or web address]  03/31/2018, 5:50 AM

## 2018-03-31 NOTE — Progress Notes (Cosign Needed)
Morphine drip D/C'd and 82 cc of drug wasted with Aloha Gell, RN.

## 2018-03-31 NOTE — Progress Notes (Addendum)
Pharmacy Antibiotic Note  [name] is a 84 y.o. male admitted on 03/30/2018 with weakness, SOB, GIB, pneumonia (suspect aspiration of secretions), and sepsis.  PMH is significant for laryngeal cancer s/p trach, spinal mets.  Pharmacy has been consulted for Vancomycin and Zosyn dosing.  Plan:  Zosyn 3.375g IV Q8H infused over 4hrs.   Vancomycin 1g IV x1 then 1250 mg IV q24h.  (Next dose in only 16 hours since only given partial loading dose)  Check vancomycin levels if remains on vancomycin > 3-4 days.  Goal AUC 400-500.  Daily SCr  Follow up renal fxn, culture results, and clinical course.  F/u ability to de-escalate antibiotics.      Temp (24hrs), Avg:100.4 F (38 C), Min:98.2 F (36.8 C), Max:103.9 F (39.9 C)  Recent Labs  Lab 03/31/18 0010  WBC 17.4*  CREATININE 0.80    Estimated Creatinine Clearance: 67.5 mL/min (by C-G formula based on SCr of 0.8 mg/dL).    Allergies  Allergen Reactions  . Terazosin Other (See Comments)    DIZZINESS  . Levothyroxine Other (See Comments)    Antimicrobials this admission: 7/16 Zosyn >> 7/16 Vancomycin >>   Dose adjustments this admission:   Microbiology results: 7/16 BCx:   7/16 Sputum:  Thank you for allowing pharmacy to be a part of this patient's care.  Gretta Arab PharmD, BCPS Pager [phone] 03/31/2018 11:26 AM

## 2018-03-31 NOTE — Progress Notes (Signed)
Pt was in a procedure the last 2 ATC checks

## 2018-03-31 NOTE — ED Notes (Signed)
ED TO INPATIENT HANDOFF REPORT  Name/Age/Gender [name] 84 y.o. male  Code Status    Code Status Orders  (From admission, onward)        Start     Ordered   03/31/18 0549  Do not attempt resuscitation (DNR)  Continuous    Question Answer Comment  In the event of cardiac or respiratory ARREST Do not call a "code blue"   In the event of cardiac or respiratory ARREST Do not perform Intubation, CPR, defibrillation or ACLS   In the event of cardiac or respiratory ARREST Use medication by any route, position, wound care, and other measures to relive pain and suffering. May use oxygen, suction and manual treatment of airway obstruction as needed for comfort.      03/31/18 0550    Code Status History    This patient has a current code status but no historical code status.      Home/SNF/Other Home  Chief Complaint SHORTNESS OF BREATH  Level of Care/Admitting Diagnosis ED Disposition    ED Disposition Condition Comment   Admit  Hospital Area: [facility] [[id number]]  Level of Care: Med-Surg [16]  Diagnosis: Acute respiratory failure ([place]) [518.81.ICD-9-CM]  Admitting Physician: Rise Patience [[id number]]  Attending Physician: Rise Patience [phone]  Estimated length of stay: past midnight tomorrow  Certification:: I certify this patient will need inpatient services for at least 2 midnights  PT Class (Do Not Modify): Inpatient [101]  PT Acc Code (Do Not Modify): Private [1]       Medical History Past Medical History:  Diagnosis Date  . Cataract, immature   . Esophageal stenosis 05/2016  . Full dentures   . GERD (gastroesophageal reflux disease)   . History of cancer of larynx 2009  . History of prostate cancer 2015  . History of radiation therapy 2015   for prostate cancer  . Hypertension    states under control with meds., has been on med. x 5-6 yr.  . 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  Terazosin Other (See Comments)    DIZZINESS  . Levothyroxine Other (See Comments)    IV Location/Drains/Wounds Patient Lines/Drains/Airways Status   Active Line/Drains/Airways    Name:   Placement date:   Placement time:   Site:   Days:   Peripheral IV 03/31/18 Left Antecubital   03/31/18    0309    Antecubital   less than 1   NG/OG Tube Nasogastric 16 Fr. Right nare Xray   03/31/18    0429    Right nare   less than 1   Urethral Catheter Laurina Bustle, RN Straight-tip 18 Fr.   11/04/16    0010    Straight-tip   512          Labs/Imaging Results for orders placed or performed during the hospital encounter of 03/30/18 (from the past 48 hour(s))  Comprehensive metabolic panel     Status: Abnormal   Collection Time: 03/31/18 12:10 AM  Result Value Ref Range   Sodium 145 135 - 145 mmol/L   Potassium 3.0 (L) 3.5 - 5.1 mmol/L   Chloride 102 98 - 111 mmol/L    Comment: Please note change in reference range.   CO2 30 22 - 32 mmol/L   Glucose, Bld 132 (H) 70 - 99 mg/dL    Comment: Please note change in reference range.   BUN 21 8 - 23 mg/dL  Comment: Please note change in reference range.   Creatinine, Ser 0.80 0.61 - 1.24 mg/dL   Calcium 9.4 8.9 - 10.3 mg/dL   Total Protein 6.2 (L) 6.5 - 8.1 g/dL   Albumin 2.5 (L) 3.5 - 5.0 g/dL   AST 52 (H) 15 - 41 U/L   ALT 40 0 - 44 U/L    Comment: Please note change in reference range.   Alkaline Phosphatase 141 (H) 38 - 126 U/L   Total Bilirubin 1.0 0.3 - 1.2 mg/dL   GFR calc non Af Amer >60 >60 mL/min   GFR calc Af Amer >60 >60 mL/min    Comment: (NOTE) The eGFR has been calculated using the CKD EPI equation. This calculation has not been validated in all clinical situations. eGFR's persistently <60 mL/min signify possible Chronic Kidney Disease.    Anion gap 13 5 - 15    Comment: Performed at [facility], [place] [address], [place], [place] [address]  CBC with Differential     Status: Abnormal   Collection Time:  03/31/18 12:10 AM  Result Value Ref Range   WBC 17.4 (H) 4.0 - 10.5 K/uL   RBC 4.11 (L) 4.22 - 5.81 MIL/uL   Hemoglobin 11.0 (L) 13.0 - 17.0 g/dL   HCT 35.1 (L) 39.0 - 52.0 %   MCV 85.4 78.0 - 100.0 fL   MCH 26.8 26.0 - 34.0 pg   MCHC 31.3 30.0 - 36.0 g/dL   RDW 14.2 11.5 - 15.5 %   Platelets 329 150 - 400 K/uL   Neutrophils Relative % 92 %   Neutro Abs 16.0 (H) 1.7 - 7.7 K/uL   Lymphocytes Relative 5 %   Lymphs Abs 0.8 0.7 - 4.0 K/uL   Monocytes Relative 3 %   Monocytes Absolute 0.5 0.1 - 1.0 K/uL   Eosinophils Relative 0 %   Eosinophils Absolute 0.0 0.0 - 0.7 K/uL   Basophils Relative 0 %   Basophils Absolute 0.0 0.0 - 0.1 K/uL    Comment: Performed at [facility], [place] [address], [place], [place] [address]  Brain natriuretic peptide     Status: None   Collection Time: 03/31/18 12:10 AM  Result Value Ref Range   B Natriuretic Peptide 94.0 0.0 - 100.0 pg/mL    Comment: Performed at [facility], [place] [address] [place], [place] [address]  Sample to Blood Bank     Status: None   Collection Time: 03/31/18 12:22 AM  Result Value Ref Range   Blood Bank Specimen SAMPLE AVAILABLE FOR TESTING    Sample Expiration      04/03/2018 Performed at [facility], [place] [address] [place], [place] [address]   Urinalysis, Routine w reflex microscopic     Status: Abnormal   Collection Time: 03/31/18  1:41 AM  Result Value Ref Range   Color, Urine YELLOW YELLOW   APPearance CLEAR CLEAR   Specific Gravity, Urine 1.024 1.005 - 1.030   pH 5.0 5.0 - 8.0   Glucose, UA NEGATIVE NEGATIVE mg/dL   Hgb urine dipstick SMALL (A) NEGATIVE   Bilirubin Urine NEGATIVE NEGATIVE   Ketones, ur 20 (A) NEGATIVE mg/dL   Protein, ur 30 (A) NEGATIVE mg/dL   Nitrite NEGATIVE NEGATIVE   Leukocytes, UA NEGATIVE NEGATIVE   RBC / HPF 0-5 0 - 5 RBC/hpf   WBC, UA 0-5 0 - 5 WBC/hpf   Bacteria, UA NONE SEEN NONE SEEN   Mucus PRESENT  Comment: Performed at  [facility], [place] [address], [place], [place] [address]  Blood gas, arterial     Status: Abnormal (Preliminary result)   Collection Time: 03/31/18  4:40 AM  Result Value Ref Range   FIO2 100.00    O2 Content PENDING L/min   Delivery systems TRACH COLLAR/TRACH TUBE    pH, Arterial 7.418 7.350 - 7.450   pCO2 arterial 47.2 32.0 - 48.0 mmHg   pO2, Arterial 67.8 (L) 83.0 - 108.0 mmHg   Bicarbonate 28.8 (H) 20.0 - 28.0 mmol/L   Acid-Base Excess 5.0 (H) 0.0 - 2.0 mmol/L   O2 Saturation 86.8 %   Patient temperature 103.9    Collection site RIGHT RADIAL    Drawn by [phone]    Sample type ARTERIAL DRAW    Allens test (pass/fail) PASS PASS    Comment: Performed at [facility], [place] [address], [place], [place] [address]   Dg Chest 1 View  Result Date: 03/31/2018 CLINICAL DATA:  84 year old male with weakness.  Tracheostomy. EXAM: CHEST  1 VIEW COMPARISON:  PET-CT dated 03/17/2018 FINDINGS: A tracheostomy is noted. Bilateral mid to lower lung field densities likely combination of pleural effusions and associated atelectatic changes. Pneumonia is not excluded. Clinical correlation is recommended. The left lower lobe mass seen on the prior PET-CT is not well visualized on this radiograph. There is no pneumothorax. The cardiac silhouette is within normal limits. No acute osseous pathology. IMPRESSION: Bilateral pleural effusions with associated atelectatic changes of the lower lobes. Pneumonia is not excluded. Clinical correlation is recommended. Electronically Signed   By: Anner Crete M.D.   On: 03/31/2018 01:26   Dg Abdomen 1 View  Result Date: 03/31/2018 CLINICAL DATA:  84 year old male status post NG tube placement. EXAM: ABDOMEN - 1 VIEW COMPARISON:  Chest CTA 0400 hours today. FINDINGS: Portable AP supine view at 0433 hours. Enteric tube courses from the mediastinum to the epigastrium. Side hole is at the level of the gastroesophageal junction. Stable  visible lung bases. Excreted IV contrast in nondilated bilateral renal collecting systems. Visualized bowel gas pattern is non obstructed. No acute osseous abnormality identified. IMPRESSION: 1. Enteric tube courses to the stomach. Side hole is at the GEJ level, advance 5 cm to ensure side hole placement within the stomach. 2. Nonobstructed visible bowel gas pattern. Nonobstructed kidneys with IV contrast excretion. Electronically Signed   By: Genevie Ann M.D.   On: 03/31/2018 05:09   Ct Angio Chest Pe W Or Wo Contrast  Result Date: 03/31/2018 CLINICAL DATA:  84 year old male with dyspnea. EXAM: CT ANGIOGRAPHY CHEST WITH CONTRAST TECHNIQUE: Multidetector CT imaging of the chest was performed using the standard protocol during bolus administration of intravenous contrast. Multiplanar CT image reconstructions and MIPs were obtained to evaluate the vascular anatomy. CONTRAST:  157m ISOVUE-370 IOPAMIDOL (ISOVUE-370) INJECTION 76% COMPARISON:  Chest radiograph dated 03/31/2018 and PET-CT dated 03/17/2018. FINDINGS: Cardiovascular: There is no cardiomegaly or pericardial effusion. The thoracic aorta is unremarkable. Probable diminutive left vertebral artery. The remainder of the visualized origins of the great vessels of the aortic arch appear patent. Evaluation of the pulmonary arteries is very limited due to respiratory motion artifact as well as streak artifact caused by patient's arms. No large or central pulmonary artery embolus identified. Mediastinum/Nodes: Bilateral hilar adenopathy measure 15 mm on the right approximately 15 mm on the left. A 13 mm rounded prevascular lymph node noted. Esophagus is grossly unremarkable. No mediastinal fluid collection. Lungs/Pleura: There is a  small left pleural effusion. There is near complete consolidative changes of the left lower lobe as well as the large area of consolidative change with air bronchogram in the right lower lobe. Additional airspace density in the right upper  lobe. These consolidative areas are new compared to the prior CT most consistent with multifocal pneumonia. The previously seen left lower lobe mass is not well visualized due to consolidative changes of the left lower lobe. There is a background of emphysema. There is a 4 mm left upper lobe subpleural nodule. There is no pneumothorax. A tracheostomy is noted. There is high-grade narrowing of the left lower lobe bronchus as well as narrowing of the right lower lobe bronchus. This is likely related to mass effect caused by hilar adenopathy. Upper Abdomen: Right renal upper pole cyst. The visualized upper abdomen is otherwise unremarkable. Musculoskeletal: T8 sclerotic lesion seen on the PET-CT. There is osteopenia. Scattered lytic lesions involving T6 with associated mild compression of the superior endplate consistent with a pathologic fracture. There is lytic metastatic disease involving T11 and T12 as well as sternum. These lesions are better seen on the prior PET-CT. Review of the MIP images confirms the above findings. IMPRESSION: 1. Very limited evaluation of the pulmonary arteries due to respiratory motion artifact. No large or central pulmonary artery embolus. 2. Large areas of consolidation involving the lower lobes as well as right upper lobe most consistent with pneumonia. There is high-grade narrowing of the left lower lobe bronchus as well as narrowing of the right lower lobe bronchus likely related to mass effect caused by hilar adenopathy. 3. Small left pleural effusion increased in size compared to the PET-CT of 03/17/2018. 4. Multiple osseous lytic lesions with minimally depressed pathology fracture of the superior endplate of T6. 5.  Emphysema (ICD10-J43.9). Electronically Signed   By: Anner Crete M.D.   On: 03/31/2018 04:47    Pending Labs Unresulted Labs (From admission, onward)   None      Vitals/Pain Today's Vitals   03/31/18 0308 03/31/18 0424 03/31/18 0435 03/31/18 0500  BP:  (!) 175/76 (!) 176/66  134/62  Pulse: (!) 131 (!) 135  (!) 122  Resp: (!) _0 Temp:   (!) 103.9 F (39.9 C)   TempSrc:   Rectal   SpO2: 93% 90%  93%    Isolation Precautions No active isolations  Medications Medications  iopamidol (ISOVUE-370) 76 % injection (has no administration in time range)  morphine (MS CONTIN) 12 hr tablet 30 mg (has no administration in time range)  acetaminophen (TYLENOL) tablet 650 mg (has no administration in time range)    Or  acetaminophen (TYLENOL) suppository 650 mg (has no administration in time range)  ondansetron (ZOFRAN) tablet 4 mg (has no administration in time range)    Or  ondansetron (ZOFRAN) injection 4 mg (has no administration in time range)  morphine 194m in NS 1098m(4m8mL) infusion - premix (has no administration in time range)  sodium chloride 0.9 % bolus 500 mL (0 mLs Intravenous Stopped 03/31/18 0308)  vancomycin (VANCOCIN) IVPB 1000 mg/200 mL premix (0 mg Intravenous Stopped 03/31/18 0528)  piperacillin-tazobactam (ZOSYN) IVPB 3.375 g (0 g Intravenous Stopped 03/31/18 0501)  HYDROmorphone (DILAUDID) injection 1 mg (1 mg Intravenous Given 03/31/18 0203)  iopamidol (ISOVUE-370) 76 % injection 100 mL (100 mLs Intravenous Contrast Given 03/31/18 0358)  pantoprazole (PROTONIX) 80 mg in sodium chloride 0.9 % 100 mL IVPB (0 mg Intravenous Stopped 03/31/18 0528)    Mobility  walks

## 2018-03-31 NOTE — Progress Notes (Signed)
  Radiation Oncology         ([phone] ________________________________  Name: [name] MRN: [id number]  Date: 03/31/2018  DOB: 1931/07/22  Simulation Verification Note    ICD-10-CM   1. Metastatic cancer to bone [facility]) C79.51     Status: inpatient  NARRATIVE: The patient was brought to the treatment unit and placed in the planned treatment position. The clinical setup was verified. Then port films were obtained and uploaded to the radiation oncology medical record software.  The treatment beams were carefully compared against the planned radiation fields. The position location and shape of the radiation fields was reviewed. They targeted volume of tissue appears to be appropriately covered by the radiation beams. Organs at risk appear to be excluded as planned.  Based on my personal review, I approved the simulation verification. The patient's treatment will proceed as planned.  -----------------------------------  Blair Promise, PhD, MD

## 2018-03-31 NOTE — ED Provider Notes (Addendum)
[facility] DEPT Provider Note   CSN: [id number] Arrival date & time: 03/30/18  2358     History   Chief Complaint Chief Complaint  Patient presents with  . Respiratory Distress    HPI [name] is a 84 y.o. male.  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  He is brought here by EMS for evaluation of weakness.  According to the daughter, patient has had a rapid decline over the past week.  He has become so weak that he cannot stand and walk on his own.  The patient denies any specific complaints.  He denies any abdominal pain, fevers, urinary complaints.  He is due to start radiation therapy tomorrow.  The history is provided by the patient.    Past Medical History:  Diagnosis Date  . Cataract, immature   . Esophageal stenosis 05/2016  . Full dentures   . GERD (gastroesophageal reflux disease)   . History of cancer of larynx 2009  . History of prostate cancer 2015  . History of radiation therapy 2015   for prostate cancer  . Hypertension    states under control with meds., has been on med. x 5-6 yr.  . 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 Metastatic cancer to bone ([place]) 03/10/2018  . Left lumbar radiculopathy 02/12/2018  . Hypothyroidism 08/05/2017  . Abnormal TSH 01/29/2017  . Dizziness 04/12/2016  . Back pain 01/20/2015  . Insomnia 01/20/2015  . Malignant neoplasm of prostate ([place]) 02/22/2014  . Hematochezia 12/10/2011  . Abdominal pain, other specified site 12/10/2011  . Laryngeal cancer ([place]) 11/10/2011  . Diarrhea 11/10/2011  . Impaired glucose tolerance   . Preventative health care 11/03/2011  . Hyperlipidemia 08/29/2008  . ANXIETY 08/29/2008  . Essential hypertension 08/29/2008  . ALLERGIC RHINITIS 08/29/2008  . DIVERTICULOSIS,  COLON 08/29/2008  . GERD 08/20/2007  . BENIGN PROSTATIC HYPERTROPHY 08/20/2007    Past Surgical History:  Procedure Laterality Date  . CO2 LASER APPLICATION N/A 5/00/9381   Procedure: CO2 LASER APPLICATION;  Surgeon: Rozetta Nunnery, MD;  Location: [facility];  Service: ENT;  Laterality: N/A;  . CO2 LASER APPLICATION N/A 05/14/9370   Procedure: CO2 LASER APPLICATION;  Surgeon: Rozetta Nunnery, MD;  Location: [facility];  Service: ENT;  Laterality: N/A;  . DIRECT LARYNGOSCOPY  04/10/2007; 02/23/2007   with bx.  . ESOPHAGEAL DILATION  06/24/2008; 12/04/2009; 12/06/2010; 01/10/2012  . ESOPHAGEAL DILATION N/A 10/12/2013   Procedure: ESOPHAGEAL DILATION;  Surgeon: Rozetta Nunnery, MD;  Location: [facility];  Service: ENT;  Laterality: N/A;  . ESOPHAGOSCOPY WITH DILITATION  11/29/2014   Procedure: ESOPHAGOSCOPY WITH DILITATION;  Surgeon: Rozetta Nunnery, MD;  Location: [facility];  Service: ENT;;  . LIPOMA EXCISION     abd.   Marland Kitchen MASS EXCISION  01/10/2012   Procedure: EXCISION MASS;  Surgeon: Rozetta Nunnery, MD;  Location: [facility];  Service: ENT;  Laterality: N/A;  posterior neck mass  . NECK SURGERY  01/10/2012   tumor removed from back of neck  . PLACEMENT OF TRACHEAL ESOPHAGEAL PROTHESIS, ESOPHAGOSCOPY WITH DILATION  04/07/2008  . PLACEMENT OF TRACHEAL ESOPHAGEAL PROTHESIS, ESOPHAGOSCOPY WITH DILATION N/A 06/14/2016   Procedure: REVISION OF TRACHEAL STOMA WITH CO2 LASER, CHANGE OF TRACHEAL ESOPHAGEAL PROSTHESIS,  ESOPHAGOSCOPY WITH DILATION;  Surgeon: Rozetta Nunnery, MD;  Location: [facility];  Service: ENT;  Laterality: N/A;  . PROSTATE BIOPSY  04/29/2005  . TONSILLECTOMY    . TOTAL LARYNGECTOMY  10/28/2007  . TRACHEAL ESOPHAGEAL PROSTHESIS (TEP) CHANGE  12/04/2009; 12/06/2010  . TRACHEOSTOMY REVISION N/A 10/12/2013   Procedure: TRACHEOSTOMA  REVSION w/ CO2 LASER;  Surgeon:  Rozetta Nunnery, MD;  Location: [facility];  Service: ENT;  Laterality: N/A;  . TRACHEOSTOMY REVISION N/A 11/29/2014   Procedure: tracheal esophageal prosthesis change ;  Surgeon: Rozetta Nunnery, MD;  Location: [facility];  Service: ENT;  Laterality: N/A;        Home Medications    Prior to Admission medications   Medication Sig Start Date End Date Taking? Authorizing Provider  amLODipine (NORVASC) 5 MG tablet Take 5 mg by mouth daily.     [provider]  aspirin 81 MG tablet Take 81 mg by mouth daily.    [provider]  dexamethasone (DECADRON) 4 MG tablet Take 1 tablet (4 mg total) by mouth 2 (two) times daily with a meal. 03/26/18   Gery Pray, MD  HYDROcodone-acetaminophen (NORCO) 7.5-325 MG tablet Take 1 tablet by mouth every 6 (six) hours as needed for moderate pain. 03/10/18   Biagio Borg, MD  lisinopril (PRINIVIL,ZESTRIL) 40 MG tablet Take 40 mg by mouth daily.     [provider]  loratadine (CLARITIN) 10 MG tablet Take 10 mg by mouth daily as needed for allergies.    [provider]  morphine (MS CONTIN) 30 MG 12 hr tablet Take 1 tablet (30 mg total) by mouth every 12 (twelve) hours. 03/25/18   Gery Pray, MD  Multiple Vitamins-Minerals (MULTIVITAMIN ADULT PO) Take by mouth.    [provider]  oxyCODONE (OXY IR/ROXICODONE) 5 MG immediate release tablet Take 1 tablet (5 mg total) by mouth every 4 (four) hours as needed for severe pain. Patient not taking: Reported on 03/25/2018 03/24/18   [name], MD  pantoprazole (PROTONIX) 40 MG tablet Take 40 mg by mouth daily.    [provider]  predniSONE (DELTASONE) 10 MG tablet 3 tabs by mouth per day for 3 days,2tabs per day for 3 days,21tab per day for 3 days Patient not taking: Reported on 03/25/2018 02/12/18   Biagio Borg, MD  ranitidine (ZANTAC) 150 MG capsule Take 150 mg by mouth every evening.     [provider]    senna-docusate (SENNA S) 8.6-50 MG tablet Take 1 tablet by mouth 2 (two) times daily. 03/24/18   [name], MD  tamsulosin (FLOMAX) 0.4 MG CAPS capsule Take 1 capsule (0.4 mg total) by mouth daily. 11/03/16   Shary Decamp, PA-C  tiZANidine (ZANAFLEX) 2 MG tablet Take 1 tablet (2 mg total) by mouth every 6 (six) hours as needed for muscle spasms. Patient not taking: Reported on 03/25/2018 02/12/18   Biagio Borg, MD    Family History Family History  Problem Relation Age of Onset  . Hypertension Father   . Heart attack Father 13  . Diabetes Brother   . Diabetes Brother   . Colon cancer Neg Hx     Social History Social History   Tobacco Use  . Smoking status: Former Smoker    Years: 25.00    Last attempt to quit: 01/07/1987    Years since quitting: 31.2  . Smokeless tobacco: Never Used  Substance Use Topics  .  Alcohol use: No  . Drug use: No     Allergies   Terazosin and Levothyroxine   Review of Systems Review of Systems  All other systems reviewed and are negative.    Physical Exam Updated Vital Signs BP (!) 168/74 (BP Location: Right Arm)   Pulse (!) 127   Temp 98.2 F (36.8 C) (Oral)   Resp (!) 23   SpO2 92%   Physical Exam  Constitutional: He is oriented to person, place, and time. He appears well-developed and well-nourished. No distress.  HENT:  Head: Normocephalic and atraumatic.  Mouth/Throat: Oropharynx is clear and moist.  Tracheostomy is present.  Neck: Normal range of motion. Neck supple.  Cardiovascular: Normal rate and regular rhythm. Exam reveals no friction rub.  No murmur heard. Pulmonary/Chest: Effort normal. No respiratory distress. He has no wheezes. He has no rales.  There are rhonchorous breath sounds bilaterally.  Abdominal: Soft. Bowel sounds are normal. He exhibits no distension. There is no tenderness.  Musculoskeletal: Normal range of motion. He exhibits no edema.  Neurological: He is alert and oriented to person, place, and  time. Coordination normal.  Skin: Skin is warm and dry. He is not diaphoretic.  Nursing note and vitals reviewed.    ED Treatments / Results  Labs (all labs ordered are listed, but only abnormal results are displayed) Labs Reviewed  COMPREHENSIVE METABOLIC PANEL  CBC WITH DIFFERENTIAL/PLATELET  BRAIN NATRIURETIC PEPTIDE  URINALYSIS, ROUTINE W REFLEX MICROSCOPIC  I-STAT CG4 LACTIC ACID, ED    EKG None  Radiology No results found.  Procedures Procedures (including critical care time)  Medications Ordered in ED Medications  sodium chloride 0.9 % bolus 500 mL (has no administration in time range)     Initial Impression / Assessment and Plan / ED Course  I have reviewed the triage vital signs and the nursing notes.  Pertinent labs & imaging results that were available during my care of the patient were reviewed by me and considered in my medical decision making (see chart for details).  Patient brought by family for evaluation of weakness.  This is progressed rapidly over the past 2 days.  He has extensive past medical history, most notably tracheostomy for laryngeal cancer.  He was recently diagnosed with lung cancer metastatic to the spine.  His work-up today reveals what appears to be infiltrates.  He has low oxygen saturations and appears to have seepage of stomach contents into his tracheostomy.  He has required suctioning on multiple occasions to maintain his saturations.  He was initially afebrile, but tachycardic with no hypotension.  Work-up was initiated into the cause of his weakness.  Chest x-ray was suggestive of bilateral pneumonia, likely aspiration.  Vancomycin and Zosyn were ordered.  I have consulted the hospitalist service for admission.  Dr. Hal Hope agrees to admit.  CRITICAL CARE Performed by: Veryl Speak Total critical care time: 35 minutes Critical care time was exclusive of separately billable procedures and treating other patients. Critical  care was necessary to treat or prevent imminent or life-threatening deterioration. Critical care was time spent personally by me on the following activities: development of treatment plan with patient and/or surrogate as well as nursing, discussions with consultants, evaluation of patient's response to treatment, examination of patient, obtaining history from patient or surrogate, ordering and performing treatments and interventions, ordering and review of laboratory studies, ordering and review of radiographic studies, pulse oximetry and re-evaluation of patient's condition.   Final Clinical Impressions(s) / ED Diagnoses  Final diagnoses:  None    ED Discharge Orders    None       Veryl Speak, MD 03/31/18 Reed Pandy    Veryl Speak, MD 03/31/18 [phone]

## 2018-03-31 NOTE — ED Notes (Signed)
Family requested pt's trach get suctioned. When RN went to assist pt with this, he refused.

## 2018-03-31 NOTE — Progress Notes (Signed)
Nutrition Brief Note  Chart reviewed. Pt now transitioning to comfort care.  No nutrition interventions warranted at this time.   Clayton Bibles, MS, RD, [place] Dietitian Pager: [phone] After Hours Pager: [phone]

## 2018-03-31 NOTE — ED Triage Notes (Addendum)
Pt here via EMS with c/o respiratory distress. Pt's daughter is in town and noted pt to have a "gurgling sound". Pt has hx of tumor on spine and has limited positions he can sit and be comfortable. Pt has voicebox in place but does not have his inner cannula with him. Per family he has not had not had it in all day. Pt has had trach x 10 years. Pt recently started taking morphine. Pt reports red suction from trach and has reported tary stool x 2 weeks

## 2018-03-31 NOTE — Progress Notes (Signed)
[facility] PROGRESS NOTE    Progress Note  [name]  ZSW:[id number] DOB: [birth year] DOA: 03/30/2018 PCP: Biagio Borg, MD     Brief Narrative:   [name] is an 84 y.o. male past medical history of laryngeal carcinoma status post tracheostomy, prostate cancer last PSA 0.15, for which he completed his radiation therapy in 2015 with a new lung mass, he was noted to have increased back pain and MRI of the lumbar spine on 02/26/2018 show foci metastatic disease to L3 vertebrae by tumor.  PET scan obtained on 03/17/2018 show 5.4 cm mass in the posterior left lower lobe, with multiple metastatic lymphadenopathy bilaterally in the mediastinum and increase radiotracer uptake throughout the spine sternum and pelvis.  Into the ED for progressive weakness shortness of breath and increased secretion through his tracheal tube.  The patient initially wanted to be a DNR but now have decided to be a full code as he would like to discuss with his family.  Assessment/Plan:   Acute respiratory failure with hypoxia (HCC) due to HCAP (healthcare-associated pneumonia): Start him on empiric IV antibiotics for this possible new healthcare versus aspiration pneumonia. Get sputum cultures and blood cultures. We will continue on oxygen, started empirically on IV fluids and start him on sepsis pathway. Check a procalcitonin and lactic acid. Monitor strict I & O's.  Essential hypertension: Hold antihypertensive medication.  Hypothyroidism: Continue Synthroid.  Metastatic cancer to bone [facility]): Continue oxycodone for pain. DC IV morphine drip. Continue oral dexamethasone.  Acute blood loss anemia: Had some mild hemoptysis when he was coughing in the ED, will avoid anticoagulation and place him on SCDs, his hemoglobin on May was 14, now 45. Cont to trend Hbg. We will put him Protonix, will check FOBT's. Wife was at bedside relates that he she has not seen any black stools or red stools and she is  currently the caregiver.    DVT prophylaxis: SCd's Family Communication:daughter and wife Disposition Plan/Barrier to D/C: unable to determine Code Status:     Code Status Orders  (From admission, onward)        Start     Ordered   03/31/18 1111  Full code  Continuous     03/31/18 1113    Code Status History    Date Active Date Inactive Code Status Order ID Comments User Context   03/31/2018 0550 03/31/2018 1113 DNR [id number]  Rise Patience, MD ED        IV Access:    Peripheral IV   Procedures and diagnostic studies:   Dg Chest 1 View  Result Date: 03/31/2018 CLINICAL DATA:  84 year old male with weakness.  Tracheostomy. EXAM: CHEST  1 VIEW COMPARISON:  PET-CT dated 03/17/2018 FINDINGS: A tracheostomy is noted. Bilateral mid to lower lung field densities likely combination of pleural effusions and associated atelectatic changes. Pneumonia is not excluded. Clinical correlation is recommended. The left lower lobe mass seen on the prior PET-CT is not well visualized on this radiograph. There is no pneumothorax. The cardiac silhouette is within normal limits. No acute osseous pathology. IMPRESSION: Bilateral pleural effusions with associated atelectatic changes of the lower lobes. Pneumonia is not excluded. Clinical correlation is recommended. Electronically Signed   By: Anner Crete M.D.   On: 03/31/2018 01:26   Dg Abdomen 1 View  Result Date: 03/31/2018 CLINICAL DATA:  84 year old male status post NG tube placement. EXAM: ABDOMEN - 1 VIEW COMPARISON:  Chest CTA 0400 hours today. FINDINGS: Portable AP supine view at  0433 hours. Enteric tube courses from the mediastinum to the epigastrium. Side hole is at the level of the gastroesophageal junction. Stable visible lung bases. Excreted IV contrast in nondilated bilateral renal collecting systems. Visualized bowel gas pattern is non obstructed. No acute osseous abnormality identified. IMPRESSION: 1. Enteric tube courses to  the stomach. Side hole is at the GEJ level, advance 5 cm to ensure side hole placement within the stomach. 2. Nonobstructed visible bowel gas pattern. Nonobstructed kidneys with IV contrast excretion. Electronically Signed   By: Genevie Ann M.D.   On: 03/31/2018 05:09   Ct Angio Chest Pe W Or Wo Contrast  Result Date: 03/31/2018 CLINICAL DATA:  84 year old male with dyspnea. EXAM: CT ANGIOGRAPHY CHEST WITH CONTRAST TECHNIQUE: Multidetector CT imaging of the chest was performed using the standard protocol during bolus administration of intravenous contrast. Multiplanar CT image reconstructions and MIPs were obtained to evaluate the vascular anatomy. CONTRAST:  19mL ISOVUE-370 IOPAMIDOL (ISOVUE-370) INJECTION 76% COMPARISON:  Chest radiograph dated 03/31/2018 and PET-CT dated 03/17/2018. FINDINGS: Cardiovascular: There is no cardiomegaly or pericardial effusion. The thoracic aorta is unremarkable. Probable diminutive left vertebral artery. The remainder of the visualized origins of the great vessels of the aortic arch appear patent. Evaluation of the pulmonary arteries is very limited due to respiratory motion artifact as well as streak artifact caused by patient's arms. No large or central pulmonary artery embolus identified. Mediastinum/Nodes: Bilateral hilar adenopathy measure 15 mm on the right approximately 15 mm on the left. A 13 mm rounded prevascular lymph node noted. Esophagus is grossly unremarkable. No mediastinal fluid collection. Lungs/Pleura: There is a small left pleural effusion. There is near complete consolidative changes of the left lower lobe as well as the large area of consolidative change with air bronchogram in the right lower lobe. Additional airspace density in the right upper lobe. These consolidative areas are new compared to the prior CT most consistent with multifocal pneumonia. The previously seen left lower lobe mass is not well visualized due to consolidative changes of the left lower  lobe. There is a background of emphysema. There is a 4 mm left upper lobe subpleural nodule. There is no pneumothorax. A tracheostomy is noted. There is high-grade narrowing of the left lower lobe bronchus as well as narrowing of the right lower lobe bronchus. This is likely related to mass effect caused by hilar adenopathy. Upper Abdomen: Right renal upper pole cyst. The visualized upper abdomen is otherwise unremarkable. Musculoskeletal: T8 sclerotic lesion seen on the PET-CT. There is osteopenia. Scattered lytic lesions involving T6 with associated mild compression of the superior endplate consistent with a pathologic fracture. There is lytic metastatic disease involving T11 and T12 as well as sternum. These lesions are better seen on the prior PET-CT. Review of the MIP images confirms the above findings. IMPRESSION: 1. Very limited evaluation of the pulmonary arteries due to respiratory motion artifact. No large or central pulmonary artery embolus. 2. Large areas of consolidation involving the lower lobes as well as right upper lobe most consistent with pneumonia. There is high-grade narrowing of the left lower lobe bronchus as well as narrowing of the right lower lobe bronchus likely related to mass effect caused by hilar adenopathy. 3. Small left pleural effusion increased in size compared to the PET-CT of 03/17/2018. 4. Multiple osseous lytic lesions with minimally depressed pathology fracture of the superior endplate of T6. 5.  Emphysema (ICD10-J43.9). Electronically Signed   By: Anner Crete M.D.   On: 03/31/2018 04:47  Medical Consultants:    None.  Anti-Infectives:   IV Vanc and cefepime  Subjective:    Caton Popowski he relates he is thirsty he would like everything done he would like to discuss it with his family before making a decision.  Objective:    Vitals:   03/31/18 0530 03/31/18 0600 03/31/18 0615 03/31/18 0650  BP: 126/88 113/67  120/67  Pulse: (!) 125 (!) 118 (!)  119 (!) 117  Resp: (!) 24 20 20 20   Temp:    99.2 F (37.3 C)  TempSrc:    Oral  SpO2: 95% 96% 95% 96%    Intake/Output Summary (Last 24 hours) at 03/31/2018 1117 Last data filed at 03/31/2018 0440 Gross per 24 hour  Intake -  Output 50 ml  Net -50 ml   There were no vitals filed for this visit.  Exam: General exam: In no acute distress cachectic Respiratory system: Good air movement and clear to auscultation. Cardiovascular system: S1 & S2 heard, RRR. Gastrointestinal system: Abdomen is nondistended, soft and nontender.  Central nervous system: Alert and oriented. No focal neurological deficits. Extremities: No pedal edema. Skin: No rashes, lesions or ulcers Psychiatry: Judgement and insight appear normal. Mood & affect appropriate.    Data Reviewed:    Labs: Basic Metabolic Panel: Recent Labs  Lab 03/31/18 0010  NA 145  K 3.0*  CL 102  CO2 30  GLUCOSE 132*  BUN 21  CREATININE 0.80  CALCIUM 9.4   GFR Estimated Creatinine Clearance: 67.5 mL/min (by C-G formula based on SCr of 0.8 mg/dL). Liver Function Tests: Recent Labs  Lab 03/31/18 0010  AST 52*  ALT 40  ALKPHOS 141*  BILITOT 1.0  PROT 6.2*  ALBUMIN 2.5*   No results for input(s): LIPASE, AMYLASE in the last 168 hours. No results for input(s): AMMONIA in the last 168 hours. Coagulation profile No results for input(s): INR, PROTIME in the last 168 hours.  CBC: Recent Labs  Lab 03/31/18 0010  WBC 17.4*  NEUTROABS 16.0*  HGB 11.0*  HCT 35.1*  MCV 85.4  PLT 329   Cardiac Enzymes: No results for input(s): CKTOTAL, CKMB, CKMBINDEX, TROPONINI in the last 168 hours. BNP (last 3 results) No results for input(s): PROBNP in the last 8760 hours. CBG: No results for input(s): GLUCAP in the last 168 hours. D-Dimer: No results for input(s): DDIMER in the last 72 hours. Hgb A1c: No results for input(s): HGBA1C in the last 72 hours. Lipid Profile: No results for input(s): CHOL, HDL, LDLCALC, TRIG,  CHOLHDL, LDLDIRECT in the last 72 hours. Thyroid function studies: No results for input(s): TSH, T4TOTAL, T3FREE, THYROIDAB in the last 72 hours.  Invalid input(s): FREET3 Anemia work up: No results for input(s): VITAMINB12, FOLATE, FERRITIN, TIBC, IRON, RETICCTPCT in the last 72 hours. Sepsis Labs: Recent Labs  Lab 03/31/18 0010  WBC 17.4*   Microbiology No results found for this or any previous visit (from the past 240 hour(s)).   Medications:   . iopamidol      . morphine  30 mg Oral Q12H  . pantoprazole  40 mg Oral Daily  . polyethylene glycol  17 g Oral BID  . senna-docusate  1 tablet Oral BID   Continuous Infusions: . sodium chloride    . ceFEPime (MAXIPIME) IV    . sodium chloride    . sodium chloride        LOS: 0 days   [facility] Pager [phone]  *Please  refer to [email or web address], password [email or web address] to get updated schedule on who will round on this patient, as hospitalists switch teams weekly. If 7PM-7AM, please contact night-coverage at [email or web address], password [email or web address] for any overnight needs.  03/31/2018, 11:17 AM

## 2018-03-31 NOTE — ED Notes (Signed)
Dr.Kakrakandy notified temp being 103.9 and result of NG tube placement. Dr.Kakrakandy to follow up with pt.

## 2018-03-31 NOTE — Progress Notes (Signed)
IP PROGRESS NOTE  Subjective:   [name] is not known to me after I saw him in consultation on 03/24/2018.  He presented with suspecting lung neoplasm and bone pain to metastatic disease.  He was hospitalized earlier this morning after presenting with symptoms of shortness of breath and hypoxia.  His evaluation including a CT scan of the chest which showed possible pneumonia as well as lymphadenopathy related to malignancy.  He was started on antibiotics as well as intravenous morphine with some improvement in his overall clinical status.  He has also commenced radiation therapy.  He denies any fevers or chills.  He does for some sweats.  He denies any chest pain or difficulty breathing.  He denies any abdominal discomfort.  He denies any confusion or alteration mental status although according to the family he did have some lethargy but has improved in the last few hours.  Objective:  Vital signs in last 24 hours: Temp:  [98.2 F (36.8 C)-103.9 F (39.9 C)] 99.2 F (37.3 C) (07/16 0650) Pulse Rate:  [117-135] 117 (07/16 0650) Resp:  [19-24] 20 (07/16 0650) BP: (113-184)/(62-88) 120/67 (07/16 0650) SpO2:  [87 %-96 %] 96 % (07/16 0650) Weight change:     Intake/Output from previous day: 07/15 0701 - 07/16 0700 In: -  Out: 50 [Emesis/NG output:50] General:chronically ill-appearing without distress. Head: Normocephalic without normalities. Mouth: No thrush or ulcers. Eyes: Sclera anicteric. Resp: Scattered rhonchi and wheezes bilaterally. Cardio: regular rate and rhythm, S1, S2 normal, no murmur, click, rub or gallop GI: soft, non-tender; bowel sounds normal; no masses,  no organomegaly Musculoskeletal: No joint deformity or effusion. Neurological: No motor, sensory deficits.  Intact deep tendon reflexes. Skin: No rashes or lesions.    Lab Results: Recent Labs    03/31/18 0010 03/31/18 1136  WBC 17.4* 16.6*  HGB 11.0* 11.0*  HCT 35.1* 35.3*  PLT 329 274    BMET Recent  Labs    03/31/18 0010  NA 145  K 3.0*  CL 102  CO2 30  GLUCOSE 132*  BUN 21  CREATININE 0.80  CALCIUM 9.4      Medications: I have reviewed the patient's current medications.  Assessment/Plan:  84 year old man with the following:  1.  Advanced lung malignancy presented with 5.4 cm mass in the posterior lower lobe suspicious for primary lung neoplasm.  He is scheduled to have a biopsy completed in the near future.  I recommend completing this step while he is hospitalized to fully characterize this malignancy and have a better idea of his overall prognosis and treatment options.  Please consult with interventional radiology for evaluation to obtaining tissue biopsy while he is hospitalized.  2.  Respiratory failure: Related to lung malignancy and possible pneumonia.  I agree with aggressive treatments with oxygen as well as antibiotics as you are doing.  3.  Advanced directives: I have recommended no ventilation support if his respiratory status declines given the underlying lung malignancy as well as his overall deterioration in his health.  He understands he has an incurable malignancy but is still willing to proceed with a biopsy and consideration for treatment depending on the results.  The patient and his family understands also that if his conditions deteriorates treatment options may be limited at that time and would favor comfort care if that happens.  4.  Pain: Related to advanced malignancy and metastatic disease to the bone.  He is currently receiving palliative radiation therapy and morphine intravenously.  We will continue  to follow his status periodically while he is hospitalized.  25  minutes was spent with the patient face-to-face today.  More than 50% of time was dedicated to patient counseling, education and discussing the natural course of this disease as well as treatment approach and management.    LOS: 0 days   Zola Button 03/31/2018, 1:57 PM

## 2018-03-31 NOTE — ED Notes (Signed)
Pt went to CT and when he returned he had coffee ground emesis around stoma. Pt's oxygen desaturated to 70%. RT called to assess pt and suction trach. EDP notified.

## 2018-03-31 NOTE — Progress Notes (Signed)
Called stat to see patient who has desaturations with Fritz Pickerel tube in place with NRB over stoma opening.  When mask is removed and placed over nose and mouth, saturations decrease.  Suctioned via stoma for large amounts of brown coffee ground materials.  MD at bedside, awaiting orders at this time.

## 2018-03-31 NOTE — ED Notes (Addendum)
Report given to [facility] on 6E

## 2018-04-01 ENCOUNTER — Ambulatory Visit (HOSPITAL_COMMUNITY)
Admission: RE | Admit: 2018-04-01 | Discharge: 2018-04-01 | Disposition: A | Discharge status: Home / Self Care | Payer: Medicare Other | Source: Ambulatory Visit | Attending: Oncology | Admitting: Oncology

## 2018-04-01 ENCOUNTER — Encounter (HOSPITAL_COMMUNITY): Payer: Self-pay | Admitting: Physician Assistant

## 2018-04-01 ENCOUNTER — Ambulatory Visit
Admission: RE | Admit: 2018-04-01 | Discharge: 2018-04-01 | Disposition: A | Discharge status: Home / Self Care | Payer: Medicare Other | Source: Ambulatory Visit | Attending: Radiation Oncology | Admitting: Radiation Oncology

## 2018-04-01 ENCOUNTER — Inpatient Hospital Stay (HOSPITAL_COMMUNITY): Payer: Medicare Other

## 2018-04-01 ENCOUNTER — Encounter (HOSPITAL_COMMUNITY): Payer: Self-pay

## 2018-04-01 DIAGNOSIS — I10 Essential (primary) hypertension: Secondary | ICD-10-CM

## 2018-04-01 DIAGNOSIS — E870 Hyperosmolality and hypernatremia: Secondary | ICD-10-CM

## 2018-04-01 LAB — BASIC METABOLIC PANEL
Anion gap: 12 (ref 5–15)
BUN: 31 mg/dL — AB (ref 8–23)
CO2: 27 mmol/L (ref 22–32)
Calcium: 9.1 mg/dL (ref 8.9–10.3)
Chloride: 110 mmol/L (ref 98–111)
Creatinine, Ser: 1.06 mg/dL (ref 0.61–1.24)
Glucose, Bld: 146 mg/dL — ABNORMAL HIGH (ref 70–99)
POTASSIUM: 3.9 mmol/L (ref 3.5–5.1)
SODIUM: 149 mmol/L — AB (ref 135–145)

## 2018-04-01 LAB — CBC
HCT: 32.9 % — ABNORMAL LOW (ref 39.0–52.0)
Hemoglobin: 10.2 g/dL — ABNORMAL LOW (ref 13.0–17.0)
MCH: 26.5 pg (ref 26.0–34.0)
MCHC: 31 g/dL (ref 30.0–36.0)
MCV: 85.5 fL (ref 78.0–100.0)
PLATELETS: 295 10*3/uL (ref 150–400)
RBC: 3.85 MIL/uL — AB (ref 4.22–5.81)
RDW: 15 % (ref 11.5–15.5)
WBC: 16.7 10*3/uL — AB (ref 4.0–10.5)

## 2018-04-01 LAB — BLOOD GAS, ARTERIAL
Acid-Base Excess: 5 mmol/L — ABNORMAL HIGH (ref 0.0–2.0)
Bicarbonate: 28.8 mmol/L — ABNORMAL HIGH (ref 20.0–28.0)
Drawn by: 51425
FIO2: 100
O2 Saturation: 86.8 %
PCO2 ART: 47.2 mmHg (ref 32.0–48.0)
Patient temperature: 103.9
pH, Arterial: 7.418 (ref 7.350–7.450)
pO2, Arterial: 67.8 mmHg — ABNORMAL LOW (ref 83.0–108.0)

## 2018-04-01 LAB — MRSA PCR SCREENING: MRSA by PCR: POSITIVE — AB

## 2018-04-01 LAB — HIV ANTIBODY (ROUTINE TESTING W REFLEX): HIV Screen 4th Generation wRfx: NONREACTIVE

## 2018-04-01 MED ORDER — HYDROCODONE-ACETAMINOPHEN 7.5-325 MG PO TABS: 1.0000 | ORAL_TABLET | Freq: Four times a day (QID) | ORAL | Status: DC | PRN

## 2018-04-01 MED ORDER — MIDAZOLAM HCL 2 MG/2ML IJ SOLN
INTRAMUSCULAR | Status: AC
  Filled 2018-04-01: qty 4

## 2018-04-01 MED ORDER — FENTANYL CITRATE (PF) 100 MCG/2ML IJ SOLN
INTRAMUSCULAR | Status: AC | PRN
  Administered 2018-04-01: 25 ug via INTRAVENOUS

## 2018-04-01 MED ORDER — MORPHINE SULFATE (PF) 4 MG/ML IV SOLN
4.0000 mg | INTRAVENOUS | Status: DC | PRN
  Administered 2018-04-01 – 2018-04-03 (×3): 4 mg via INTRAVENOUS
  Filled 2018-04-01 (×3): qty 1

## 2018-04-01 MED ORDER — LIDOCAINE HCL (PF) 1 % IJ SOLN
INTRAMUSCULAR | Status: AC | PRN
  Administered 2018-04-01: 10 mL

## 2018-04-01 MED ORDER — FENTANYL CITRATE (PF) 100 MCG/2ML IJ SOLN
INTRAMUSCULAR | Status: AC
  Filled 2018-04-01: qty 2

## 2018-04-01 MED ORDER — MIDAZOLAM HCL 2 MG/2ML IJ SOLN
INTRAMUSCULAR | Status: AC | PRN
  Administered 2018-04-01: 1 mg via INTRAVENOUS

## 2018-04-01 MED ORDER — SODIUM CHLORIDE 0.45 % IV SOLN
INTRAVENOUS | Status: DC
  Administered 2018-04-01 – 2018-04-02 (×2): via INTRAVENOUS

## 2018-04-01 MED ORDER — MORPHINE SULFATE ER 30 MG PO TBCR
60.0000 mg | EXTENDED_RELEASE_TABLET | Freq: Two times a day (BID) | ORAL | Status: DC
  Administered 2018-04-01 – 2018-04-02 (×3): 60 mg via ORAL
  Filled 2018-04-01 (×3): qty 2

## 2018-04-01 NOTE — Progress Notes (Signed)
[facility] PROGRESS NOTE    Progress Note  [name]  YNW:[id number] DOB: 04-04-1931 DOA: 03/30/2018 PCP: Biagio Borg, MD     Brief Narrative:   [name] is an 84 y.o. male past medical history of laryngeal carcinoma status post tracheostomy, prostate cancer last PSA 0.15, for which he completed his radiation therapy in 2015 with a new lung mass, he was noted to have increased back pain and MRI of the lumbar spine on 02/26/2018 show foci metastatic disease to L3 vertebrae by tumor.  PET scan obtained on 03/17/2018 show 5.4 cm mass in the posterior left lower lobe, with multiple metastatic lymphadenopathy bilaterally in the mediastinum and increase radiotracer uptake throughout the spine sternum and pelvis.  Into the ED for progressive weakness shortness of breath and increased secretion through his tracheal tube.  The patient initially wanted to be a DNR but now have decided to be a full code as he would like to discuss with his family.  Assessment/Plan:   Acute respiratory failure with hypoxia (HCC) due to HCAP (healthcare-associated pneumonia): Cont empiric IV antibiotics for healthcare PNA versus aspiration pneumonia. Cultures is pending. Cont IV vanc and cefepime. Has remained a febrile, with persistent leukocytosis. Procalcitonin high. Monitor strict I & O's.  Essential hypertension: Hold antihypertensive medication.  Hypothyroidism: Continue Synthroid.  Metastatic cancer to bone [facility]): Continue oxycodone for pain. DC IV morphine drip. Continue oral dexamethasone.  New lung mass: Oncology was consulted and recommended having biopsy of new lung mass in house.  Acute blood loss anemia: Had some mild hemoptysis on admission, hbg is now stable. Cont oral Protonix, will check FOBT's. Wife has not seen melena or hematochezia.  New hypernatremia: Change IV fluid O.45% saline infusion. Likely decrease oral intake. Recheck b-met in am.   Advance  directives: Oncology recommended  And I agree with  No vent support. Awaiting PMT recommendations.  DVT prophylaxis: SCd's Family Communication:daughter and wife Disposition Plan/Barrier to D/C: unable to determine Code Status:     Code Status Orders  (From admission, onward)        Start     Ordered   03/31/18 1111  Full code  Continuous     03/31/18 1113    Code Status History    Date Active Date Inactive Code Status Order ID Comments User Context   03/31/2018 0550 03/31/2018 1113 DNR [id number]  Rise Patience, MD ED        IV Access:    Peripheral IV   Procedures and diagnostic studies:   Dg Chest 1 View  Result Date: 03/31/2018 CLINICAL DATA:  84 year old male with weakness.  Tracheostomy. EXAM: CHEST  1 VIEW COMPARISON:  PET-CT dated 03/17/2018 FINDINGS: A tracheostomy is noted. Bilateral mid to lower lung field densities likely combination of pleural effusions and associated atelectatic changes. Pneumonia is not excluded. Clinical correlation is recommended. The left lower lobe mass seen on the prior PET-CT is not well visualized on this radiograph. There is no pneumothorax. The cardiac silhouette is within normal limits. No acute osseous pathology. IMPRESSION: Bilateral pleural effusions with associated atelectatic changes of the lower lobes. Pneumonia is not excluded. Clinical correlation is recommended. Electronically Signed   By: Anner Crete M.D.   On: 03/31/2018 01:26   Dg Abdomen 1 View  Result Date: 03/31/2018 CLINICAL DATA:  84 year old male status post NG tube placement. EXAM: ABDOMEN - 1 VIEW COMPARISON:  Chest CTA 0400 hours today. FINDINGS: Portable AP supine view at 0433 hours. Enteric tube  courses from the mediastinum to the epigastrium. Side hole is at the level of the gastroesophageal junction. Stable visible lung bases. Excreted IV contrast in nondilated bilateral renal collecting systems. Visualized bowel gas pattern is non obstructed. No  acute osseous abnormality identified. IMPRESSION: 1. Enteric tube courses to the stomach. Side hole is at the GEJ level, advance 5 cm to ensure side hole placement within the stomach. 2. Nonobstructed visible bowel gas pattern. Nonobstructed kidneys with IV contrast excretion. Electronically Signed   By: Genevie Ann M.D.   On: 03/31/2018 05:09   Ct Angio Chest Pe W Or Wo Contrast  Result Date: 03/31/2018 CLINICAL DATA:  84 year old male with dyspnea. EXAM: CT ANGIOGRAPHY CHEST WITH CONTRAST TECHNIQUE: Multidetector CT imaging of the chest was performed using the standard protocol during bolus administration of intravenous contrast. Multiplanar CT image reconstructions and MIPs were obtained to evaluate the vascular anatomy. CONTRAST:  142m ISOVUE-370 IOPAMIDOL (ISOVUE-370) INJECTION 76% COMPARISON:  Chest radiograph dated 03/31/2018 and PET-CT dated 03/17/2018. FINDINGS: Cardiovascular: There is no cardiomegaly or pericardial effusion. The thoracic aorta is unremarkable. Probable diminutive left vertebral artery. The remainder of the visualized origins of the great vessels of the aortic arch appear patent. Evaluation of the pulmonary arteries is very limited due to respiratory motion artifact as well as streak artifact caused by patient's arms. No large or central pulmonary artery embolus identified. Mediastinum/Nodes: Bilateral hilar adenopathy measure 15 mm on the right approximately 15 mm on the left. A 13 mm rounded prevascular lymph node noted. Esophagus is grossly unremarkable. No mediastinal fluid collection. Lungs/Pleura: There is a small left pleural effusion. There is near complete consolidative changes of the left lower lobe as well as the large area of consolidative change with air bronchogram in the right lower lobe. Additional airspace density in the right upper lobe. These consolidative areas are new compared to the prior CT most consistent with multifocal pneumonia. The previously seen left lower  lobe mass is not well visualized due to consolidative changes of the left lower lobe. There is a background of emphysema. There is a 4 mm left upper lobe subpleural nodule. There is no pneumothorax. A tracheostomy is noted. There is high-grade narrowing of the left lower lobe bronchus as well as narrowing of the right lower lobe bronchus. This is likely related to mass effect caused by hilar adenopathy. Upper Abdomen: Right renal upper pole cyst. The visualized upper abdomen is otherwise unremarkable. Musculoskeletal: T8 sclerotic lesion seen on the PET-CT. There is osteopenia. Scattered lytic lesions involving T6 with associated mild compression of the superior endplate consistent with a pathologic fracture. There is lytic metastatic disease involving T11 and T12 as well as sternum. These lesions are better seen on the prior PET-CT. Review of the MIP images confirms the above findings. IMPRESSION: 1. Very limited evaluation of the pulmonary arteries due to respiratory motion artifact. No large or central pulmonary artery embolus. 2. Large areas of consolidation involving the lower lobes as well as right upper lobe most consistent with pneumonia. There is high-grade narrowing of the left lower lobe bronchus as well as narrowing of the right lower lobe bronchus likely related to mass effect caused by hilar adenopathy. 3. Small left pleural effusion increased in size compared to the PET-CT of 03/17/2018. 4. Multiple osseous lytic lesions with minimally depressed pathology fracture of the superior endplate of T6. 5.  Emphysema (ICD10-J43.9). Electronically Signed   By: AAnner CreteM.D.   On: 03/31/2018 04:47     Medical  Consultants:    None.  Anti-Infectives:   IV Vanc and cefepime  Subjective:    [name]  Relates he cont to thirsty.  Objective:    Vitals:   03/31/18 2228 04/01/18 0032 04/01/18 0049 04/01/18 0406  BP: (!) 106/53  (!) 106/53 105/63  Pulse: (!) 126 (!) 128 (!) 124 (!)  119  Resp: 16 (!) _0 Temp: 98.4 F (36.9 C)  99.2 F (37.3 C) 99.7 F (37.6 C)  TempSrc: Oral  Oral Oral  SpO2: (!) 69% (!) 88% (!) 60% (!) 71%  Weight:      Height:        Intake/Output Summary (Last 24 hours) at 04/01/2018 0917 Last data filed at 04/01/2018 0520 Gross per 24 hour  Intake 405.83 ml  Output 200 ml  Net 205.83 ml   Filed Weights   03/31/18 2155  Weight: 84.1 kg (185 lb 6.5 oz)    Exam: General exam: In no acute distress cachectic Respiratory system: Good air movement and clear to auscultation. Cardiovascular system: S1 & S2 heard, RRR. Gastrointestinal system: Abdomen is nondistended, soft and nontender.  Central nervous system: Alert and oriented. No focal neurological deficits. Extremities: No pedal edema. Skin: No rashes, lesions or ulcers Psychiatry: Judgement and insight appear normal. Mood & affect appropriate.    Data Reviewed:    Labs: Basic Metabolic Panel: Recent Labs  Lab 03/31/18 0010 04/01/18 0422  NA 145 149*  K 3.0* 3.9  CL 102 110  CO2 30 27  GLUCOSE 132* 146*  BUN 21 31*  CREATININE 0.80 1.06  CALCIUM 9.4 9.1   GFR Estimated Creatinine Clearance: 50.9 mL/min (by C-G formula based on SCr of 1.06 mg/dL). Liver Function Tests: Recent Labs  Lab 03/31/18 0010  AST 52*  ALT 40  ALKPHOS 141*  BILITOT 1.0  PROT 6.2*  ALBUMIN 2.5*   No results for input(s): LIPASE, AMYLASE in the last 168 hours. No results for input(s): AMMONIA in the last 168 hours. Coagulation profile Recent Labs  Lab 03/31/18 1136  INR 1.40    CBC: Recent Labs  Lab 03/31/18 0010 03/31/18 1136 04/01/18 0422  WBC 17.4* 16.6* 16.7*  NEUTROABS 16.0* 15.4*  --   HGB 11.0* 11.0* 10.2*  HCT 35.1* 35.3* 32.9*  MCV 85.4 85.1 85.5  PLT 329 274 295   Cardiac Enzymes: No results for input(s): CKTOTAL, CKMB, CKMBINDEX, TROPONINI in the last 168 hours. BNP (last 3 results) No results for input(s): PROBNP in the last 8760 hours. CBG: No  results for input(s): GLUCAP in the last 168 hours. D-Dimer: No results for input(s): DDIMER in the last 72 hours. Hgb A1c: No results for input(s): HGBA1C in the last 72 hours. Lipid Profile: No results for input(s): CHOL, HDL, LDLCALC, TRIG, CHOLHDL, LDLDIRECT in the last 72 hours. Thyroid function studies: No results for input(s): TSH, T4TOTAL, T3FREE, THYROIDAB in the last 72 hours.  Invalid input(s): FREET3 Anemia work up: No results for input(s): VITAMINB12, FOLATE, FERRITIN, TIBC, IRON, RETICCTPCT in the last 72 hours. Sepsis Labs: Recent Labs  Lab 03/31/18 0010 03/31/18 1136 03/31/18 1137 03/31/18 1424 04/01/18 0422  PROCALCITON  --  7.40  --   --   --   WBC 17.4* 16.6*  --   --  16.7*  LATICACIDVEN  --   --  1.3 1.5  --    Microbiology No results found for this or any previous visit (from the past 240 hour(s)).   Medications:   .  dexamethasone  4 mg Oral BID WC  . morphine  30 mg Oral Q12H  . pantoprazole  40 mg Oral BID  . polyethylene glycol  17 g Oral BID  . potassium chloride  20 mEq Oral BID  . tamsulosin  0.4 mg Oral Daily   Continuous Infusions: . sodium chloride    . piperacillin-tazobactam (ZOSYN)  IV 3.375 g (04/01/18 0548)  . vancomycin Stopped (03/31/18 2216)      LOS: 1 day   Charlynne Cousins  [facility] Pager [phone]  *Please refer to [email or web address], password [email or web address] to get updated schedule on who will round on this patient, as hospitalists switch teams weekly. If 7PM-7AM, please contact night-coverage at [email or web address], password [email or web address] for any overnight needs.  04/01/2018, 9:17 AM

## 2018-04-01 NOTE — Evaluation (Addendum)
SLP Cancellation Note  Patient Details Name: [name] MRN: [id number] DOB: Aug 17, 1931   Cancelled treatment:       Reason Eval/Treat Not Completed: (pt at radiation tx at this time, will continue efforts)  Of note, pt is s/p laryngectomy 10/28/2007 per his notes in the chart.  He also has h/o TEP use - ? If concern for possible TE fistula?   Spoke to MD regarding pt and recommend consider esophagram if concern for aspiration to evaluate for possible fistula.   SLP will sign off, Thanks for this referral.     Macario Golds 04/01/2018, 3:19 PM   Luanna Salk, [place] [facility] SLP [phone]

## 2018-04-01 NOTE — Progress Notes (Signed)
RT in to see pt. on rounds for trach/oxygen/saturation status, staff at bedside, pt. with oxygen sats in 70's, family at bedside, placed ATC,(Aerosol Trach Collar) on pt. with NRB bag to achieve sats >88% as previously set up/used , per staff/family noted occlusive dressing holding Lary tube to be loose with 1/3 unsealed, staff assisted pt./family with removal/cleaning/replacement of new occlusive dressing along with replacement of push-button for speech by pt., oxygen saturations improved after replacement, pt. Remained on supplemental oxygen per order.

## 2018-04-01 NOTE — Procedures (Signed)
CT guided core biopsies of L3 left transverse process lytic bone lesion.  Soft tissue cores collected.  Minimal blood loss and no immediate complication.

## 2018-04-01 NOTE — Progress Notes (Signed)
PO2 SATs 91% on 98% TC. Emergency equipment at bedside.

## 2018-04-01 NOTE — Progress Notes (Signed)
Rt placed pt on 98%TC due to low sats. No distress noted at this time. RN aware of pt O2 requirements.

## 2018-04-01 NOTE — H&P (Signed)
Chief Complaint: Metastatic cancer  Referring Physician(s): Zola Button, MD  Supervising Physician: [name]  Patient Status: [facility] - In-pt  History of Present Illness: [name] is a 84 y.o. male past medical history of laryngeal carcinoma status post tracheostomy.  He also has h/o prostate cancer which was treated with radiation therapy in 2015.  He presented to the ED yesterday with weakness and shortness of breath due to increased secretions from his trach stoma.  On recent outpatient visit with Dr. Alen Blew, he was found to have a new lung mass and also c/o increased back pain.  MRI of the lumbar spine on 02/26/2018 show foci metastatic disease to L3 vertebrae by tumor.    PET scan obtained on 03/17/2018 show 5.4 cm mass in the posterior left lower lobe, with multiple metastatic lymphadenopathy bilaterally in the mediastinum and increase radiotracer uptake throughout the spine sternum and pelvis.    We were initially asked to biopsy the lung lesion, however given new finding of consolidation on CT scan of the chest done yesterday, patient is very high risk for complication.  Images reviewed by Dr. Anselm Pancoast. The L3 lesion is a much safer option for biopsy.  Patient is NPO. No blood thinners.   Past Medical History:  Diagnosis Date  . Cataract, immature   . Esophageal stenosis 05/2016  . Full dentures   . GERD (gastroesophageal reflux disease)   . History of cancer of larynx 2009  . History of prostate cancer 2015  . History of radiation therapy 2015   for prostate cancer  . Hypertension    states under control with meds., has been on med. x 5-6 yr.  . 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 CO2 LASER APPLICATION N/A 0/94/7096   Procedure: CO2 LASER APPLICATION;  Surgeon: Rozetta Nunnery, MD;  Location: [place];  Service: ENT;  Laterality: N/A;  . CO2 LASER APPLICATION N/A 2/83/6629   Procedure: CO2 LASER APPLICATION;  Surgeon: Rozetta Nunnery, MD;  Location: [place];  Service: ENT;  Laterality: N/A;  . DIRECT LARYNGOSCOPY  04/10/2007; 02/23/2007   with bx.  . ESOPHAGEAL DILATION  06/24/2008; 12/04/2009; 12/06/2010; 01/10/2012  . ESOPHAGEAL DILATION N/A 10/12/2013   Procedure: ESOPHAGEAL DILATION;  Surgeon: Rozetta Nunnery, MD;  Location: [place];  Service: ENT;  Laterality: N/A;  . ESOPHAGOSCOPY WITH DILITATION  11/29/2014   Procedure: ESOPHAGOSCOPY WITH DILITATION;  Surgeon: Rozetta Nunnery, MD;  Location: [place];  Service: ENT;;  . LIPOMA EXCISION     abd.   Marland Kitchen MASS EXCISION  01/10/2012   Procedure: EXCISION MASS;  Surgeon: Rozetta Nunnery, MD;  Location: [place];  Service: ENT;  Laterality: N/A;  posterior neck mass  . NECK SURGERY  01/10/2012   tumor removed from back of neck  . PLACEMENT OF TRACHEAL ESOPHAGEAL PROTHESIS, ESOPHAGOSCOPY WITH DILATION  04/07/2008  . PLACEMENT OF TRACHEAL ESOPHAGEAL PROTHESIS, ESOPHAGOSCOPY WITH DILATION N/A 06/14/2016   Procedure: REVISION OF TRACHEAL STOMA WITH CO2 LASER, CHANGE OF TRACHEAL ESOPHAGEAL PROSTHESIS, ESOPHAGOSCOPY WITH DILATION;  Surgeon: Rozetta Nunnery, MD;  Location: [place];  Service: ENT;  Laterality: N/A;  . PROSTATE BIOPSY  04/29/2005  . TONSILLECTOMY    . TOTAL LARYNGECTOMY  10/28/2007  . TRACHEAL ESOPHAGEAL PROSTHESIS (TEP) CHANGE  12/04/2009; 12/06/2010  . TRACHEOSTOMY REVISION N/A 10/12/2013   Procedure: TRACHEOSTOMA  REVSION w/  CO2 LASER;  Surgeon: Rozetta Nunnery, MD;  Location: [place];  Service: ENT;  Laterality: N/A;  . TRACHEOSTOMY REVISION N/A 11/29/2014   Procedure: tracheal esophageal prosthesis change ;  Surgeon: Rozetta Nunnery, MD;  Location: [place];  Service: ENT;  Laterality: N/A;    Allergies: Terazosin and Levothyroxine  Medications: Prior to  Admission medications   Medication Sig Start Date End Date Taking? Authorizing Provider  amLODipine (NORVASC) 5 MG tablet Take 5 mg by mouth daily.    Yes [provider]  aspirin 81 MG tablet Take 81 mg by mouth daily.   Yes [provider]  HYDROcodone-acetaminophen (NORCO) 7.5-325 MG tablet Take 1 tablet by mouth every 6 (six) hours as needed for moderate pain. 03/10/18  Yes Biagio Borg, MD  lisinopril (PRINIVIL,ZESTRIL) 40 MG tablet Take 40 mg by mouth daily.    Yes [provider]  morphine (MS CONTIN) 30 MG 12 hr tablet Take 1 tablet (30 mg total) by mouth every 12 (twelve) hours. 03/25/18  Yes Gery Pray, MD  Multiple Vitamins-Minerals (MULTIVITAMIN ADULT PO) Take by mouth.   Yes [provider]  senna-docusate (SENNA S) 8.6-50 MG tablet Take 1 tablet by mouth 2 (two) times daily. Patient taking differently: Take 1 tablet by mouth 2 (two) times daily as needed for mild constipation.  03/24/18  Yes [name], MD  tamsulosin (FLOMAX) 0.4 MG CAPS capsule Take 1 capsule (0.4 mg total) by mouth daily. 11/03/16  Yes Shary Decamp, PA-C  Vitamin D, Ergocalciferol, (DRISDOL) 50000 units CAPS capsule Take 50,000 Units by mouth every 7 (seven) days.   Yes [provider]  dexamethasone (DECADRON) 4 MG tablet Take 1 tablet (4 mg total) by mouth 2 (two) times daily with a meal. Patient not taking: Reported on 03/31/2018 03/26/18   Gery Pray, MD  oxyCODONE (OXY IR/ROXICODONE) 5 MG immediate release tablet Take 1 tablet (5 mg total) by mouth every 4 (four) hours as needed for severe pain. Patient not taking: Reported on 03/25/2018 03/24/18   [name], MD  pantoprazole (PROTONIX) 40 MG tablet Take 40 mg by mouth daily.    [provider]  predniSONE (DELTASONE) 10 MG tablet 3 tabs by mouth per day for 3 days,2tabs per day for 3 days,21tab per day for 3 days Patient not taking: Reported on 03/25/2018 02/12/18   Biagio Borg, MD  ranitidine  (ZANTAC) 150 MG capsule Take 150 mg by mouth every evening.     [provider]  tiZANidine (ZANAFLEX) 2 MG tablet Take 1 tablet (2 mg total) by mouth every 6 (six) hours as needed for muscle spasms. Patient not taking: Reported on 03/25/2018 02/12/18   Biagio Borg, MD     Family History  Problem Relation Age of Onset  . Hypertension Father   . Heart attack Father 47  . Diabetes Brother   . Diabetes Brother   . Colon cancer Neg Hx     Social History   Socioeconomic History  . Marital status: Married    Spouse name: Not on file  . Number of children: 1  . Years of education: Not on file  . Highest education level: Not on file  Occupational History  . Occupation: Retired  Scientific laboratory technician  . Financial resource strain: Not on file  . Food insecurity:    Worry: Not on file    Inability: Not on file  . Transportation needs:  Medical: Not on file    Non-medical: Not on file  Tobacco Use  . Smoking status: Former Smoker    Years: 25.00    Last attempt to quit: 01/07/1987    Years since quitting: 31.2  . Smokeless tobacco: Never Used  Substance and Sexual Activity  . Alcohol use: No  . Drug use: No  . Sexual activity: Not on file  Lifestyle  . Physical activity:    Days per week: Not on file    Minutes per session: Not on file  . Stress: Not on file  Relationships  . Social connections:    Talks on phone: Not on file    Gets together: Not on file    Attends religious service: Not on file    Active member of club or organization: Not on file    Attends meetings of clubs or organizations: Not on file    Relationship status: Not on file  Other Topics Concern  . Not on file  Social History Narrative   Daily caffeine      Review of Systems: A 12 point ROS discussed and pertinent positives are indicated in the HPI above.  All other systems are negative.  Review of Systems  Vital Signs: BP 105/63 (BP Location: Right Arm)   Pulse (!) 119   Temp 99.7 F (37.6  C) (Oral)   Resp 16   Ht 5' 9.02" (1.753 m)   Wt 185 lb 6.5 oz (84.1 kg)   SpO2 (!) 71%   BMI 27.37 kg/m   Physical Exam  Constitutional: He is oriented to person, place, and time. He appears well-developed.  Ill appearing, NAD  Neck:  Trach stoma in place  Cardiovascular: Normal rate, regular rhythm and normal heart sounds.  Pulmonary/Chest: Effort normal. No respiratory distress.  + Rhonchi bilaterally.  Musculoskeletal: Normal range of motion.  Neurological: He is alert and oriented to person, place, and time.  Skin: Skin is warm and dry.  Psychiatric: He has a normal mood and affect. His behavior is normal. Judgment and thought content normal.  Vitals reviewed.   Imaging: Dg Chest 1 View  Result Date: 03/31/2018 CLINICAL DATA:  84 year old male with weakness.  Tracheostomy. EXAM: CHEST  1 VIEW COMPARISON:  PET-CT dated 03/17/2018 FINDINGS: A tracheostomy is noted. Bilateral mid to lower lung field densities likely combination of pleural effusions and associated atelectatic changes. Pneumonia is not excluded. Clinical correlation is recommended. The left lower lobe mass seen on the prior PET-CT is not well visualized on this radiograph. There is no pneumothorax. The cardiac silhouette is within normal limits. No acute osseous pathology. IMPRESSION: Bilateral pleural effusions with associated atelectatic changes of the lower lobes. Pneumonia is not excluded. Clinical correlation is recommended. Electronically Signed   By: Anner Crete M.D.   On: 03/31/2018 01:26   Dg Abdomen 1 View  Result Date: 03/31/2018 CLINICAL DATA:  84 year old male status post NG tube placement. EXAM: ABDOMEN - 1 VIEW COMPARISON:  Chest CTA 0400 hours today. FINDINGS: Portable AP supine view at 0433 hours. Enteric tube courses from the mediastinum to the epigastrium. Side hole is at the level of the gastroesophageal junction. Stable visible lung bases. Excreted IV contrast in nondilated bilateral renal  collecting systems. Visualized bowel gas pattern is non obstructed. No acute osseous abnormality identified. IMPRESSION: 1. Enteric tube courses to the stomach. Side hole is at the GEJ level, advance 5 cm to ensure side hole placement within the stomach. 2. Nonobstructed visible bowel gas  pattern. Nonobstructed kidneys with IV contrast excretion. Electronically Signed   By: Genevie Ann M.D.   On: 03/31/2018 05:09   Ct Angio Chest Pe W Or Wo Contrast  Result Date: 03/31/2018 CLINICAL DATA:  84 year old male with dyspnea. EXAM: CT ANGIOGRAPHY CHEST WITH CONTRAST TECHNIQUE: Multidetector CT imaging of the chest was performed using the standard protocol during bolus administration of intravenous contrast. Multiplanar CT image reconstructions and MIPs were obtained to evaluate the vascular anatomy. CONTRAST:  148mL ISOVUE-370 IOPAMIDOL (ISOVUE-370) INJECTION 76% COMPARISON:  Chest radiograph dated 03/31/2018 and PET-CT dated 03/17/2018. FINDINGS: Cardiovascular: There is no cardiomegaly or pericardial effusion. The thoracic aorta is unremarkable. Probable diminutive left vertebral artery. The remainder of the visualized origins of the great vessels of the aortic arch appear patent. Evaluation of the pulmonary arteries is very limited due to respiratory motion artifact as well as streak artifact caused by patient's arms. No large or central pulmonary artery embolus identified. Mediastinum/Nodes: Bilateral hilar adenopathy measure 15 mm on the right approximately 15 mm on the left. A 13 mm rounded prevascular lymph node noted. Esophagus is grossly unremarkable. No mediastinal fluid collection. Lungs/Pleura: There is a small left pleural effusion. There is near complete consolidative changes of the left lower lobe as well as the large area of consolidative change with air bronchogram in the right lower lobe. Additional airspace density in the right upper lobe. These consolidative areas are new compared to the prior CT most  consistent with multifocal pneumonia. The previously seen left lower lobe mass is not well visualized due to consolidative changes of the left lower lobe. There is a background of emphysema. There is a 4 mm left upper lobe subpleural nodule. There is no pneumothorax. A tracheostomy is noted. There is high-grade narrowing of the left lower lobe bronchus as well as narrowing of the right lower lobe bronchus. This is likely related to mass effect caused by hilar adenopathy. Upper Abdomen: Right renal upper pole cyst. The visualized upper abdomen is otherwise unremarkable. Musculoskeletal: T8 sclerotic lesion seen on the PET-CT. There is osteopenia. Scattered lytic lesions involving T6 with associated mild compression of the superior endplate consistent with a pathologic fracture. There is lytic metastatic disease involving T11 and T12 as well as sternum. These lesions are better seen on the prior PET-CT. Review of the MIP images confirms the above findings. IMPRESSION: 1. Very limited evaluation of the pulmonary arteries due to respiratory motion artifact. No large or central pulmonary artery embolus. 2. Large areas of consolidation involving the lower lobes as well as right upper lobe most consistent with pneumonia. There is high-grade narrowing of the left lower lobe bronchus as well as narrowing of the right lower lobe bronchus likely related to mass effect caused by hilar adenopathy. 3. Small left pleural effusion increased in size compared to the PET-CT of 03/17/2018. 4. Multiple osseous lytic lesions with minimally depressed pathology fracture of the superior endplate of T6. 5.  Emphysema (ICD10-J43.9). Electronically Signed   By: Anner Crete M.D.   On: 03/31/2018 04:47   Nm Pet (axumin) Skull Base To Mid Thigh  Result Date: 03/18/2018 CLINICAL DATA:  Prostate carcinoma with biochemical recurrence. Personal history of laryngeal carcinoma. EXAM: NUCLEAR MEDICINE PET SKULL BASE TO THIGH TECHNIQUE: 10.2 mCi  F-18 Fluciclovine was injected intravenously. Full-ring PET imaging was performed from the skull base to thigh after the radiotracer. CT data was obtained and used for attenuation correction and anatomic localization. COMPARISON:  Lumbar MRI on 02/26/2018 FINDINGS: NECK No  radiotracer activity in neck lymph nodes. Incidental CT finding: None CHEST A 5.4 cm mass is seen in the posterior left lower lobe which shows radiotracer uptake. This is highly suspicious for primary bronchogenic carcinoma. There are other scattered sub-cm pulmonary nodule seen in both lungs which show no radiotracer uptake, but are suspicious for small pulmonary metastases. Moderate emphysema is also noted. A focus of radiotracer uptake is seen within the left lateral pleural space, consistent with pleural metastasis. Radiotracer uptake is seen within mild left hilar lymphadenopathy. Mild mediastinal lymphadenopathy is also seen subcarinal, AP window, lateral aortic, and right paratracheal regions which shows radiotracer uptake. Incidental CT finding: None ABDOMEN/PELVIS Prostate: Several fiducial markers are seen within the prostate which is mildly enlarged. Mild to moderate diffuse radiotracer uptake is seen prostate gland. Lymph nodes: No abnormal radiotracer accumulation within pelvic or abdominal nodes. Liver: No evidence of liver metastasis Incidental CT finding: None SKELETON Foci of increased radiotracer uptake are seen throughout the spine, sternum, and pelvis, which correspond to lytic bone lesions on CT. These are not typical for prostate carcinoma, and are suspicious for metastatic lung carcinoma or possibly laryngeal carcinoma. IMPRESSION: Diffuse lytic bone metastases, not typical for prostate carcinoma, and instead suspicious for metastatic lung or possibly laryngeal carcinoma. 5.4 cm lung mass in posterior left lower lobe, highly suspicious for primary bronchogenic carcinoma. Metastatic lymphadenopathy in left hilum and bilateral  mediastinum. Left lateral pleural based metastasis and tiny pleural effusion. These results will be called to the ordering clinician or representative by the Radiologist Assistant, and communication documented in the PACS or zVision Dashboard. Electronically Signed   By: Earle Gell M.D.   On: 03/18/2018 08:39    Labs:  CBC: Recent Labs    03/10/18 1107 03/31/18 0010 03/31/18 1136 04/01/18 0422  WBC 10.4 17.4* 16.6* 16.7*  HGB 12.1* 11.0* 11.0* 10.2*  HCT 36.7* 35.1* 35.3* 32.9*  PLT 420.0* 329 274 295    COAGS: Recent Labs    03/31/18 1136  INR 1.40  APTT 37*    BMP: Recent Labs    02/12/18 0942 03/10/18 1107 03/31/18 0010 04/01/18 0422  NA 142 142 145 149*  K 4.2 3.6 3.0* 3.9  CL 104 103 102 110  CO2 27 29 30 27   GLUCOSE 103* 124* 132* 146*  BUN 13 16 21  31*  CALCIUM 9.3 9.5 9.4 9.1  CREATININE 0.93 0.89 0.80 1.06  GFRNONAA  --   --  >60 >60  GFRAA  --   --  >60 >60    LIVER FUNCTION TESTS: Recent Labs    08/05/17 1458 02/12/18 0942 03/10/18 1107 03/31/18 0010  BILITOT 0.4 0.5 0.5 1.0  AST 49* 28 19 52*  ALT 49 28 18 40  ALKPHOS 65 80 101 141*  PROT 7.3 7.2 6.9 6.2*  ALBUMIN 4.3 3.9 3.6 2.5*    TUMOR MARKERS: No results for input(s): AFPTM, CEA, CA199, CHROMGRNA in the last 8760 hours.  Assessment and Plan:  New lung mass with metastatic lesion to L3.  Will proceed with biopsy of L3 today by Dr. Anselm Pancoast.  Risks and benefits discussed with the patient including, but not limited to bleeding, infection, damage to adjacent structures or low yield requiring additional tests.  All of the patient's questions were answered, patient is agreeable to proceed. Consent signed and in chart.  Thank you for this interesting consult.  I greatly enjoyed meeting [name] and look forward to participating in their care.  A copy of this  report was sent to the requesting provider on this date.  Electronically Signed: Murrell Redden, PA-C   04/01/2018, 9:36  AM      I spent a total of 40 Minutes in face to face in clinical consultation, greater than 50% of which was counseling/coordinating care for L3 biopsy.

## 2018-04-01 NOTE — Progress Notes (Signed)
Tonight, Mr. Proia had an episode of hypoxia with stats in the low 60's - tried the c-collar and was not successful - then went to a NRB mask with the c-collar attachment in place which did bump patient up to >90%. Stats remained around 95-95% for a while - NRB was removed with stats on RA at 95%. Patient has some bloody mucus coming from site when expectorated however his periskin around stoma site is a little irritated from the frequent manipulation by the patient who insists on changing his dressing and pipe cleaning his stoma multiple times a shift.

## 2018-04-01 NOTE — Progress Notes (Signed)
Pt remains on 98% O2 sats 94%.

## 2018-04-02 ENCOUNTER — Other Ambulatory Visit: Payer: Medicare Other

## 2018-04-02 ENCOUNTER — Ambulatory Visit
Admission: RE | Admit: 2018-04-02 | Discharge: 2018-04-02 | Disposition: A | Discharge status: Home / Self Care | Payer: Medicare Other | Source: Ambulatory Visit | Attending: Radiation Oncology | Admitting: Radiation Oncology

## 2018-04-02 LAB — CBC
HEMATOCRIT: 31.6 % — AB (ref 39.0–52.0)
Hemoglobin: 9.4 g/dL — ABNORMAL LOW (ref 13.0–17.0)
MCH: 25.9 pg — ABNORMAL LOW (ref 26.0–34.0)
MCHC: 29.7 g/dL — ABNORMAL LOW (ref 30.0–36.0)
MCV: 87.1 fL (ref 78.0–100.0)
PLATELETS: 281 10*3/uL (ref 150–400)
RBC: 3.63 MIL/uL — ABNORMAL LOW (ref 4.22–5.81)
RDW: 15 % (ref 11.5–15.5)
WBC: 15.7 10*3/uL — AB (ref 4.0–10.5)

## 2018-04-02 LAB — BASIC METABOLIC PANEL
Anion gap: 11 (ref 5–15)
BUN: 34 mg/dL — AB (ref 8–23)
CHLORIDE: 109 mmol/L (ref 98–111)
CO2: 29 mmol/L (ref 22–32)
Calcium: 9.2 mg/dL (ref 8.9–10.3)
Creatinine, Ser: 0.84 mg/dL (ref 0.61–1.24)
GFR calc Af Amer: >60 mL/min (ref >60)
GFR calc non Af Amer: >60 mL/min (ref >60)
GLUCOSE: 132 mg/dL — AB (ref 70–99)
POTASSIUM: 3.6 mmol/L (ref 3.5–5.1)
Sodium: 149 mmol/L — ABNORMAL HIGH (ref 135–145)

## 2018-04-02 MED ORDER — LEVOFLOXACIN 25 MG/ML PO SOLN
500.0000 mg | Freq: Every day | ORAL | Status: DC
  Filled 2018-04-02: qty 20

## 2018-04-02 MED ORDER — HYDROCODONE-ACETAMINOPHEN 7.5-325 MG/15ML PO SOLN
10.0000 mL | ORAL | Status: DC | PRN
  Administered 2018-04-03 – 2018-04-04 (×5): 10 mL via ORAL
  Filled 2018-04-02 (×6): qty 15

## 2018-04-02 MED ORDER — SENNOSIDES-DOCUSATE SODIUM 8.6-50 MG PO TABS
1.0000 | ORAL_TABLET | Freq: Two times a day (BID) | ORAL | Status: DC
  Administered 2018-04-02 – 2018-04-15 (×14): 1 via ORAL
  Filled 2018-04-02 (×17): qty 1

## 2018-04-02 MED ORDER — DEXTROSE 5 % IV SOLN
INTRAVENOUS | Status: DC
  Administered 2018-04-02 – 2018-04-12 (×13): via INTRAVENOUS
  Filled 2018-04-02 (×7): qty 1000

## 2018-04-02 MED ORDER — LEVOFLOXACIN 250 MG PO TABS
500.0000 mg | ORAL_TABLET | ORAL | Status: DC
  Administered 2018-04-02 – 2018-04-04 (×3): 500 mg via ORAL
  Filled 2018-04-02 (×4): qty 2

## 2018-04-02 NOTE — Progress Notes (Signed)
Events noted last 24 hours.  Clinically appears to be stable.  Biopsy completed on 04/01/2018 we will await the results at this time.  I recommend continued supportive management as you are doing and complete radiation therapy.  Further recommendations will be following depending on the results of the biopsy.  We will continue to follow with you while he is hospitalized.

## 2018-04-02 NOTE — Progress Notes (Signed)
[facility] Therapy Treatment Record Phone [phone]   Radiation Therapy was administered to [name] on: 04/02/2018  11:15 AM and was treatment # 3 out of a planned course of 10 treatments.  Radiation Treatment  1). Beam photons with 6-10 energy and Photons 6-10 MeV  2). Brachytherapy None  3). Stereotactic Radiosurgery None  4). Other Radiation None     Miguel Rota

## 2018-04-02 NOTE — Progress Notes (Signed)
Pt O2 decreased from 98% to 80% sats 95%. RN suction pt. No distress noted at this time.

## 2018-04-02 NOTE — Progress Notes (Signed)
[facility] PROGRESS NOTE    Progress Note  [name]  ZOX:[id number] DOB: Oct 03, 1930 DOA: 03/30/2018 PCP: Biagio Borg, MD     Brief Narrative:   [name] is an 84 y.o. male past medical history of laryngeal carcinoma status post tracheostomy, prostate cancer last PSA 0.15, for which he completed his radiation therapy in 2015 with a new lung mass, he was noted to have increased back pain and MRI of the lumbar spine on 02/26/2018 show foci metastatic disease to L3 vertebrae by tumor.  PET scan obtained on 03/17/2018 show 5.4 cm mass in the posterior left lower lobe, with multiple metastatic lymphadenopathy bilaterally in the mediastinum and increase radiotracer uptake throughout the spine sternum and pelvis.  Into the ED for progressive weakness shortness of breath and increased secretion through his tracheal tube.  The patient initially wanted to be a DNR but now have decided to be a full code as he would like to discuss with his family.  Assessment/Plan:   Acute respiratory failure with hypoxia (HCC) due to HCAP (healthcare-associated pneumonia): Cont empiric IV antibiotics for healthcare PNA versus aspiration pneumonia. Cultures has remained negative. Change antibiotic regimen to oral Levaquin. Procalcitonin high. Monitor strict I & O's.  Essential hypertension: Hold antihypertensive medication.  Hypothyroidism: Continue Synthroid.  Metastatic cancer to bone [facility]): Continue oxycodone for pain, status post biopsy on 04/01/2018 see details as below. Cont liq norco Continue oral dexamethasone.  New lung mass metastatic lytic lesions: Oncology was consulted status post biopsy of L3 transverse process on 04/02/2018. Pain is control.  Acute blood loss anemia: Had some mild hemoptysis on admission, hbg is now stable. Cont oral Protonix, will check FOBT's. Wife has not seen melena or hematochezia.  New hypernatremia: Persistent hypernatremia, change IV fluids to  D5We infusion. Likely decrease oral intake. Recheck b-met in am.   Advance directives: Oncology recommended  And I agree with  No vent support. Awaiting PMT recommendations.  DVT prophylaxis: SCd's Family Communication:daughter and wife Disposition Plan/Barrier to D/C: unable to determine Code Status:     Code Status Orders  (From admission, onward)        Start     Ordered   03/31/18 1111  Full code  Continuous     03/31/18 1113    Code Status History    Date Active Date Inactive Code Status Order ID Comments User Context   03/31/2018 0550 03/31/2018 1113 DNR [id number]  Rise Patience, MD ED        IV Access:    Peripheral IV   Procedures and diagnostic studies:   Ct Biopsy  Result Date: 04/01/2018 INDICATION: 84 year old with history of prostate and laryngeal cancer. The patient now has lung lesions and evidence for metastatic disease. Tissue diagnosis is needed. Lytic lesion involving the L3 left transverse process was targeted for biopsy. EXAM: CT-GUIDED CORE BIOPSY OF THE L3 TRANSVERSE PROCESS LESION MEDICATIONS: None. ANESTHESIA/SEDATION: Moderate (conscious) sedation was employed during this procedure. A total of Versed 1 mg and Fentanyl 25 mcg was administered intravenously. Moderate Sedation Time: 10 minutes. The patient's level of consciousness and vital signs were monitored continuously by radiology nursing throughout the procedure under my direct supervision. FLUOROSCOPY TIME:  None COMPLICATIONS: None immediate. PROCEDURE: Informed written consent was obtained from the patient after a thorough discussion of the procedural risks, benefits and alternatives. All questions were addressed. A timeout was performed prior to the initiation of the procedure. Patient was placed on his left side. CT images through  the lumbar spine were obtained. The L3 left transverse process lesion was identified and targeted for biopsy. The back was prepped with chlorhexidine and  sterile field was created. Skin and soft tissues were anesthetized with 1% lidocaine. 17 gauge coaxial needle was directed into the expansile lytic lesion with CT guidance. Core biopsies were obtained with an 18 gauge device. Specimens placed in formalin. Coaxial needle was removed without complication. Bandage placed over the puncture site. FINDINGS: Expansile lytic lesion involving the L3 left transverse process. Needle position confirmed within the lesion. Adequate core biopsies were placed in formalin. IMPRESSION: CT-guided core biopsies of the L3 left transverse process lytic lesion. Electronically Signed   By: [name] M.D.   On: 04/01/2018 20:17     Medical Consultants:    None.  Anti-Infectives:   IV Vanc and cefepime  Subjective:    [name]  feel much better than yesterday with more strength and would like to work with Physical therapy  Objective:    Vitals:   04/02/18 0155 04/02/18 0345 04/02/18 0635 04/02/18 0813  BP: (!) 103/57  117/69   Pulse: 84  83 89  Resp: '12  15 16  '$ Temp: 98.4 F (36.9 C)  99.7 F (37.6 C)   TempSrc: Oral  Oral   SpO2: 98% 98% 99% 95%  Weight:      Height:        Intake/Output Summary (Last 24 hours) at 04/02/2018 0958 Last data filed at 04/01/2018 1031 Gross per 24 hour  Intake -  Output 150 ml  Net -150 ml   Filed Weights   03/31/18 2155  Weight: 84.1 kg (185 lb 6.5 oz)    Exam: General exam: In no acute distress cachectic Respiratory system: Good air movement and clear to auscultation. Cardiovascular system: S1 & S2 heard, RRR. Gastrointestinal system: Abdomen is nondistended, soft and nontender.  Central nervous system: Alert and oriented. No focal neurological deficits. Extremities: No pedal edema. Skin: No rashes, lesions or ulcers Psychiatry: Judgement and insight appear normal. Mood & affect appropriate.    Data Reviewed:    Labs: Basic Metabolic Panel: Recent Labs  Lab 03/31/18 0010 04/01/18 0422  04/02/18 0449  NA 145 149* 149*  K 3.0* 3.9 3.6  CL 102 110 109  CO2 '30 27 29  '$ GLUCOSE 132* 146* 132*  BUN 21 31* 34*  CREATININE 0.80 1.06 0.84  CALCIUM 9.4 9.1 9.2   GFR Estimated Creatinine Clearance: 64.3 mL/min (by C-G formula based on SCr of 0.84 mg/dL). Liver Function Tests: Recent Labs  Lab 03/31/18 0010  AST 52*  ALT 40  ALKPHOS 141*  BILITOT 1.0  PROT 6.2*  ALBUMIN 2.5*   No results for input(s): LIPASE, AMYLASE in the last 168 hours. No results for input(s): AMMONIA in the last 168 hours. Coagulation profile Recent Labs  Lab 03/31/18 1136  INR 1.40    CBC: Recent Labs  Lab 03/31/18 0010 03/31/18 1136 04/01/18 0422 04/02/18 0449  WBC 17.4* 16.6* 16.7* 15.7*  NEUTROABS 16.0* 15.4*  --   --   HGB 11.0* 11.0* 10.2* 9.4*  HCT 35.1* 35.3* 32.9* 31.6*  MCV 85.4 85.1 85.5 87.1  PLT 329 274 295 281   Cardiac Enzymes: No results for input(s): CKTOTAL, CKMB, CKMBINDEX, TROPONINI in the last 168 hours. BNP (last 3 results) No results for input(s): PROBNP in the last 8760 hours. CBG: No results for input(s): GLUCAP in the last 168 hours. D-Dimer: No results for input(s): DDIMER in the  last 72 hours. Hgb A1c: No results for input(s): HGBA1C in the last 72 hours. Lipid Profile: No results for input(s): CHOL, HDL, LDLCALC, TRIG, CHOLHDL, LDLDIRECT in the last 72 hours. Thyroid function studies: No results for input(s): TSH, T4TOTAL, T3FREE, THYROIDAB in the last 72 hours.  Invalid input(s): FREET3 Anemia work up: No results for input(s): VITAMINB12, FOLATE, FERRITIN, TIBC, IRON, RETICCTPCT in the last 72 hours. Sepsis Labs: Recent Labs  Lab 03/31/18 0010 03/31/18 1136 03/31/18 1137 03/31/18 1424 04/01/18 0422 04/02/18 0449  PROCALCITON  --  7.40  --   --   --   --   WBC 17.4* 16.6*  --   --  16.7* 15.7*  LATICACIDVEN  --   --  1.3 1.5  --   --    Microbiology Recent Results (from the past 240 hour(s))  Culture, blood (routine x 2) Call MD if  unable to obtain prior to antibiotics being given     Status: None (Preliminary result)   Collection Time: 03/31/18 11:36 AM  Result Value Ref Range Status   Specimen Description   Final    BLOOD RIGHT ANTECUBITAL Performed at [facility], [place] [address] [place], [place] [address]    Special Requests   Final    BOTTLES DRAWN AEROBIC AND ANAEROBIC Blood Culture adequate volume Performed at [facility] [address], [place], [place] [address]    Culture   Final    NO GROWTH 1 DAY Performed at [facility], [place] [address], [place], [place] [address]    Report Status PENDING  Incomplete  Culture, blood (routine x 2) Call MD if unable to obtain prior to antibiotics being given     Status: None (Preliminary result)   Collection Time: 03/31/18 11:44 AM  Result Value Ref Range Status   Specimen Description   Final    BLOOD RIGHT HAND Performed at [facility] [address] [place], [place] [address]    Special Requests   Final    BOTTLES DRAWN AEROBIC AND ANAEROBIC Blood Culture adequate volume Performed at [facility] [address], [place], [place] [address]    Culture   Final    NO GROWTH 1 DAY Performed at [facility], [place] [address], [place], [place] [address]    Report Status PENDING  Incomplete  MRSA PCR Screening     Status: Abnormal   Collection Time: 04/01/18  2:15 PM  Result Value Ref Range Status   MRSA by PCR POSITIVE (A) NEGATIVE Final    Comment:        The GeneXpert MRSA Assay (FDA approved for NASAL specimens only), is one component of a comprehensive MRSA colonization surveillance program. It is not intended to diagnose MRSA infection nor to guide or monitor treatment for MRSA infections. RESULT CALLED TO, READ BACK BY AND VERIFIED WITH: Vincenza Hews [id number] @ [address] Performed at [facility] [address], [place],  [place] [address]      Medications:   . dexamethasone  4 mg Oral BID WC  . morphine  60 mg Oral Q12H  . pantoprazole  40 mg Oral BID  . potassium chloride  20 mEq Oral BID  . tamsulosin  0.4 mg Oral Daily   Continuous Infusions: . sodium chloride 75 mL/hr at 04/02/18 0812  . piperacillin-tazobactam (ZOSYN)  IV Stopped (04/02/18 0936)  . vancomycin Stopped (04/01/18 2323)  LOS: 2 days   [facility] Pager [phone]  *Please refer to [email or web address], password [email or web address] to get updated schedule on who will round on this patient, as hospitalists switch teams weekly. If 7PM-7AM, please contact night-coverage at [email or web address], password [email or web address] for any overnight needs.  04/02/2018, 9:58 AM

## 2018-04-02 NOTE — Evaluation (Signed)
Physical Therapy Evaluation Patient Details Name: [name] MRN: [id number] DOB: 10/17/1930 Today's Date: 04/02/2018   History of Present Illness  [name] is an 84 y.o. male past medical history of laryngeal carcinoma status post tracheostomy, prostate cancer ,MRI  02/26/2018 showed metastatic disease to L3 vertebrae by tumor.  PET scan obtained on 03/17/2018 shows  left lower lobe, with multiple metastatic lymphadenopathy bilaterally in the mediastinum ,spine, sternum and pelvis.  Brought to ED 7/15 for progressive weakness, shortness of breath and increased secretion through his tracheal tube.  Clinical Impression  The patient  Did mobilize with mod assist to sitting and stood x 1 at RW with 2 assist. Reports increased pain in mid back  when sitting /standing. Patient was very active  Until 1 week ago. Pt admitted with above diagnosis. Pt currently with functional limitations due to the deficits listed below (see PT Problem List). Pt will benefit from skilled PT to increase their independence and safety with mobility to allow discharge to the venue listed below.       Follow Up Recommendations Home health PT;SNF(vs.)    Equipment Recommendations  Rolling walker with 5" wheels;3in1 (PT)    Recommendations for Other Services OT consult     Precautions / Restrictions Precautions Precautions: Fall Precaution Comments: trach      Mobility  Bed Mobility Overal bed mobility: Needs Assistance Bed Mobility: Rolling;Sidelying to Sit;Sit to Supine Rolling: Mod assist Sidelying to sit: Mod assist   Sit to supine: Mod assist   General bed mobility comments: assist to roll for back precautions, assist with legs to for trunk to sitting. asist with legs onto bed.  Transfers Overall transfer level: Needs assistance Equipment used: Rolling walker (2 wheeled) Transfers: Sit to/from Stand Sit to Stand: Mod assist;+2 physical assistance;+2 safety/equipment;From elevated surface          General transfer comment: assist to rise from bed with 2 trilas to accomplish standing. Stood x 2 minutes with complaints of increasd pain in back.   Ambulation/Gait                Stairs            Wheelchair Mobility    Modified Rankin (Stroke Patients Only)       Balance Overall balance assessment: Needs assistance Sitting-balance support: Feet supported;Bilateral upper extremity supported Sitting balance-Leahy Scale: Fair     Standing balance support: During functional activity;Bilateral upper extremity supported Standing balance-Leahy Scale: Poor Standing balance comment: relies on UEs                             Pertinent Vitals/Pain Pain Assessment: 0-10 Pain Score: 7  Pain Location: mid back when sitting up Pain Descriptors / Indicators: Discomfort;Grimacing;Pressure Pain Intervention(s): Monitored during session;Limited activity within patient's tolerance;Premedicated before session    [name] expects to be discharged to:: Private residence Living Arrangements: Spouse/significant other Available Help at Discharge: Family Type of Home: House Home Access: Stairs to enter   Technical brewer of Steps: 2 Home Layout: One level Home Equipment: None      Prior Function Level of Independence: Independent         Comments: was mowing 2 weeks ago.     Hand Dominance        Extremity/Trunk Assessment   Upper Extremity Assessment Upper Extremity Assessment: Generalized weakness    Lower Extremity Assessment Lower Extremity Assessment: Generalized weakness  Communication   Communication: Tracheostomy(communicates with valve with finger occlusion)  Cognition Arousal/Alertness: Awake/alert Behavior During Therapy: WFL for tasks assessed/performed Overall Cognitive Status: Within Functional Limits for tasks assessed                                        General Comments       Exercises General Exercises - Lower Extremity Ankle Circles/Pumps: AROM;Both;10 reps Short Arc Quad: AROM;Both;10 reps Heel Slides: AAROM;Both;10 reps Hip ABduction/ADduction: AAROM;Both;10 reps   Assessment/Plan    PT Assessment Patient needs continued PT services  PT Problem List Decreased strength;Decreased activity tolerance;Decreased mobility;Decreased knowledge of precautions;Decreased safety awareness;Decreased knowledge of use of DME;Pain;Cardiopulmonary status limiting activity       PT Treatment Interventions DME instruction;Gait training;Functional mobility training;Therapeutic activities;Patient/family education;Therapeutic exercise    PT Goals (Current goals can be found in the Care Plan section)  Acute Rehab PT Goals Patient Stated Goal: to eget back to being active PT Goal Formulation: With patient/family Time For Goal Achievement: 04/16/18 Potential to Achieve Goals: Good    Frequency Min 3X/week   Barriers to discharge Decreased caregiver support      Co-evaluation               AM-PAC PT "6 Clicks" Daily Activity  Outcome Measure Difficulty turning over in bed (including adjusting bedclothes, sheets and blankets)?: Unable Difficulty moving from lying on back to sitting on the side of the bed? : Unable Difficulty sitting down on and standing up from a chair with arms (e.g., wheelchair, bedside commode, etc,.)?: Unable Help needed moving to and from a bed to chair (including a wheelchair)?: Total Help needed walking in hospital room?: Total Help needed climbing 3-5 steps with a railing? : Total 6 Click Score: 6    End of Session Equipment Utilized During Treatment: Oxygen(80% trach collar) Activity Tolerance: Patient limited by pain Patient left: in bed;with call bell/phone within reach;with family/visitor present Nurse Communication: Mobility status PT Visit Diagnosis: Unsteadiness on feet (R26.81);Pain Pain - Right/Left: (back)    Time:  8264-1583 PT Time Calculation (min) (ACUTE ONLY): 39 min   Charges:   PT Evaluation $PT Eval Moderate Complexity: 1 Mod PT Treatments $Therapeutic Exercise: 8-22 mins $Therapeutic Activity: 8-22 mins   PT G CodesTresa Endo PT [phone]   Claretha Cooper 04/02/2018, 3:35 PM

## 2018-04-03 ENCOUNTER — Ambulatory Visit
Admission: RE | Admit: 2018-04-03 | Discharge: 2018-04-03 | Disposition: A | Discharge status: Home / Self Care | Payer: Medicare Other | Source: Ambulatory Visit | Attending: Radiation Oncology | Admitting: Radiation Oncology

## 2018-04-03 DIAGNOSIS — G893 Neoplasm related pain (acute) (chronic): Secondary | ICD-10-CM

## 2018-04-03 DIAGNOSIS — Z7189 Other specified counseling: Secondary | ICD-10-CM

## 2018-04-03 DIAGNOSIS — C3490 Malignant neoplasm of unspecified part of unspecified bronchus or lung: Secondary | ICD-10-CM

## 2018-04-03 DIAGNOSIS — Z515 Encounter for palliative care: Secondary | ICD-10-CM

## 2018-04-03 DIAGNOSIS — C7951 Secondary malignant neoplasm of bone: Secondary | ICD-10-CM

## 2018-04-03 DIAGNOSIS — R531 Weakness: Secondary | ICD-10-CM

## 2018-04-03 DIAGNOSIS — Z66 Do not resuscitate: Secondary | ICD-10-CM

## 2018-04-03 LAB — BASIC METABOLIC PANEL
Anion gap: 8 (ref 5–15)
BUN: 25 mg/dL — AB (ref 8–23)
CHLORIDE: 106 mmol/L (ref 98–111)
CO2: 30 mmol/L (ref 22–32)
Calcium: 8.9 mg/dL (ref 8.9–10.3)
Creatinine, Ser: 0.67 mg/dL (ref 0.61–1.24)
GFR calc Af Amer: >60 mL/min (ref >60)
GFR calc non Af Amer: >60 mL/min (ref >60)
Glucose, Bld: 128 mg/dL — ABNORMAL HIGH (ref 70–99)
POTASSIUM: 3.5 mmol/L (ref 3.5–5.1)
Sodium: 144 mmol/L (ref 135–145)

## 2018-04-03 LAB — CBC
HEMATOCRIT: 30.6 % — AB (ref 39.0–52.0)
HEMOGLOBIN: 9.5 g/dL — AB (ref 13.0–17.0)
MCH: 26.2 pg (ref 26.0–34.0)
MCHC: 31 g/dL (ref 30.0–36.0)
MCV: 84.3 fL (ref 78.0–100.0)
Platelets: 200 10*3/uL (ref 150–400)
RBC: 3.63 MIL/uL — AB (ref 4.22–5.81)
RDW: 14.6 % (ref 11.5–15.5)
WBC: 13.5 10*3/uL — ABNORMAL HIGH (ref 4.0–10.5)

## 2018-04-03 MED ORDER — MORPHINE SULFATE (PF) 4 MG/ML IV SOLN
4.0000 mg | INTRAVENOUS | Status: AC | PRN
  Administered 2018-04-03 – 2018-04-04 (×3): 4 mg via INTRAVENOUS
  Filled 2018-04-03 (×3): qty 1

## 2018-04-03 MED ORDER — PANTOPRAZOLE SODIUM 40 MG PO PACK
40.0000 mg | PACK | Freq: Two times a day (BID) | ORAL | Status: DC
  Administered 2018-04-03 – 2018-04-05 (×4): 40 mg
  Filled 2018-04-03 (×5): qty 20

## 2018-04-03 MED ORDER — POLYETHYLENE GLYCOL 3350 17 G PO PACK
17.0000 g | PACK | Freq: Every day | ORAL | Status: DC
  Administered 2018-04-11 – 2018-04-14 (×3): 17 g via ORAL
  Filled 2018-04-03 (×3): qty 1

## 2018-04-03 MED ORDER — FENTANYL 50 MCG/HR TD PT72
50.0000 ug | MEDICATED_PATCH | TRANSDERMAL | Status: DC
  Administered 2018-04-03 – 2018-04-06 (×2): 50 ug via TRANSDERMAL
  Filled 2018-04-03 (×2): qty 1

## 2018-04-03 MED ORDER — CHLORHEXIDINE GLUCONATE CLOTH 2 % EX PADS
6.0000 | MEDICATED_PAD | Freq: Every day | CUTANEOUS | Status: AC
  Administered 2018-04-03 – 2018-04-07 (×4): 6 via TOPICAL

## 2018-04-03 MED ORDER — MUPIROCIN 2 % EX OINT
1.0000 "application " | TOPICAL_OINTMENT | Freq: Two times a day (BID) | CUTANEOUS | Status: AC
  Administered 2018-04-03 – 2018-04-07 (×9): 1 via NASAL
  Filled 2018-04-03: qty 22

## 2018-04-03 NOTE — Progress Notes (Signed)
Physical Therapy Treatment Patient Details Name: [name] MRN: [id number] DOB: 11/27/1930 Today's Date: 04/03/2018    History of Present Illness [name] is an 84 y.o. male past medical history of laryngeal carcinoma status post tracheostomy, prostate cancer MRI  02/26/2018 showed metastatic disease to L3 vertebrae by tumor.  PET scan obtained on 03/17/2018 shows mass of  left lower lobe, with multiple metastatic lymphadenopathy bilaterally in the mediastinum ,spine, sternum and pelvis.  Brought to ED 7/15 for progressive weakness, shortness of breath and increased secretion through his tracheal tube.    PT Comments    Pt in bed on 10lts O2 vis trach collar with RN in room giving IV pain meds for session.   Supine             BP 134/89, HR 88, O2 95% EOB                 BP 130/73, HR 92, O2 88% Sitting after amb 3 feet     HR 103, O2 98% General Gait Details: very limited gait distance due to back pain and excessive WBing through B UE's on walker.  Family assisted with IV pole and recliner to follow.  Positioned in recliner to comfort at 12:15pm.    Follow Up Recommendations  Home health PT;SNF(pending medical/progress/family.  Will need 24/7 assist )     Equipment Recommendations  Rolling walker with 5" wheels;3in1 (PT);Hospital bed    Recommendations for Other Services       Precautions / Restrictions Precautions Precautions: Fall Precaution Comments: trach collar O2 Restrictions Weight Bearing Restrictions: No    Mobility  Bed Mobility Overal bed mobility: Needs Assistance Bed Mobility: Rolling;Sidelying to Sit;Sit to Supine Rolling: Mod assist         General bed mobility comments: semi flat bed "log roll" to LEFT with increased time and Mod Assist limiteed by back pain  Transfers Overall transfer level: Needs assistance Equipment used: Rolling walker (2 wheeled) Transfers: Sit to/from Stand Sit to Stand: Mod assist;+2 physical assistance;+2  safety/equipment;From elevated surface         General transfer comment: + 2 assist for safety and equipment from elevated bed with increased c/o back pain with each position change  Ambulation/Gait Ambulation/Gait assistance: Min assist Gait Distance (Feet): 3 Feet Assistive device: Rolling walker (2 wheeled) Gait Pattern/deviations: Step-to pattern;Step-through pattern;Decreased step length - right;Decreased step length - left;Shuffle Gait velocity: decreased    General Gait Details: very limited gait distance due to back pain and excessive WBing through B UE's on walker.  Family assisted with IV pole and recliner to follow.     Stairs             Wheelchair Mobility    Modified Rankin (Stroke Patients Only)       Balance                                            Cognition Arousal/Alertness: Awake/alert Behavior During Therapy: WFL for tasks assessed/performed Overall Cognitive Status: Within Functional Limits for tasks assessed                                 General Comments: AxO x4 pleasant      Exercises      General Comments  Pertinent Vitals/Pain Pain Assessment: 0-10 Pain Score: 9  Pain Location: mid back throughout session Pain Descriptors / Indicators: Discomfort;Grimacing;Pressure Pain Intervention(s): Monitored during session;Repositioned;Premedicated before session    Home Living                      Prior Function            PT Goals (current goals can now be found in the care plan section) Progress towards PT goals: Progressing toward goals    Frequency    Min 3X/week      PT Plan Current plan remains appropriate    Co-evaluation              AM-PAC PT "6 Clicks" Daily Activity  Outcome Measure  Difficulty turning over in bed (including adjusting bedclothes, sheets and blankets)?: A Lot Difficulty moving from lying on back to sitting on the side of the bed? : A  Lot Difficulty sitting down on and standing up from a chair with arms (e.g., wheelchair, bedside commode, etc,.)?: A Lot Help needed moving to and from a bed to chair (including a wheelchair)?: A Lot Help needed walking in hospital room?: A Lot Help needed climbing 3-5 steps with a railing? : Total 6 Click Score: 11    End of Session Equipment Utilized During Treatment: Oxygen Activity Tolerance: Patient limited by pain Patient left: in chair;with call bell/phone within reach;with family/visitor present Nurse Communication: Mobility status PT Visit Diagnosis: Unsteadiness on feet (R26.81);Pain     Time: 3744-5146 PT Time Calculation (min) (ACUTE ONLY): 33 min  Charges:  $Gait Training: 8-22 mins $Therapeutic Activity: 8-22 mins                    G Codes:       Rica Koyanagi  PTA [facility]  Acute  Rehab Pager      [phone]

## 2018-04-03 NOTE — Progress Notes (Signed)
[facility] Therapy Treatment Record Phone [phone]   Radiation Therapy was administered to [name] on: 04/03/2018  2:26 PM and was treatment # 4 out of a planned course of 10 treatments.  Radiation Treatment  1). Beam photons with 6-10 energy  2). Brachytherapy None  3). Stereotactic Radiosurgery None  4). Other Radiation None     Trudee Kuster, RT (T)

## 2018-04-03 NOTE — Progress Notes (Signed)
OT Cancellation Note  Patient Details Name: [name] MRN: [id number] DOB: Sep 23, 1930   Cancelled Treatment:    Reason Eval/Treat Not Completed: Patient at procedure or test/ unavailable. [name] at XRT and will need pain medication when he returns. Will try to check back over the weekend as schedule permits.  Khalil Belote 04/03/2018, 2:20 PM  Lesle Chris, OTR/L [phone] 04/03/2018

## 2018-04-03 NOTE — Progress Notes (Signed)
IP PROGRESS NOTE  Subjective:   Events noted.  [name] is able to sit in a chair more comfortably but could not participate in physical therapy for an extended period of time because of pain.  His respiratory status appears to be stable at this time.  He continues to require oxygen supplementation via trach collar.  He denies any cough or difficulty breathing.  He denies any changes in his appetite although it is still very slow but is able to eat better.  He denies any abdominal pain or discomfort.  His pain still an issue especially with ambulation currently on morphine.  Objective:  Vital signs in last 24 hours: Temp:  [98.1 F (36.7 C)-98.5 F (36.9 C)] 98.5 F (36.9 C) (07/19 0553) Pulse Rate:  [85-96] 90 (07/19 0731) Resp:  [16-17] 16 (07/19 0731) BP: (112-122)/(68-70) 121/69 (07/19 0553) SpO2:  [91 %-98 %] 92 % (07/19 0731) FiO2 (%):  [40 %-60 %] 40 % (07/19 0731) Weight change:  Last BM Date: 03/31/18  Intake/Output from previous day: 07/18 0701 - 07/19 0700 In: -  Out: 400 [Urine:400] General: Medically ill-appearing gentleman without distress sitting in a chair. Head: Normocephalic atraumatic. Mouth: No oral thrush or ulcers. Eyes: No scleral icterus.  Pupils are equal and round reactive to light. Resp: Expiratory wheezes noted bilaterally. Cardio: regular rate and rhythm, S1, S2 normal, no murmur, click, rub or gallop GI: soft, non-tender; bowel sounds normal; no masses,  no organomegaly Musculoskeletal: No joint deformity or effusion. Neurological: No motor, sensory deficits.  Intact deep tendon reflexes. Skin: No rashes or lesions.    Lab Results: Recent Labs    04/02/18 0449 04/03/18 0432  WBC 15.7* 13.5*  HGB 9.4* 9.5*  HCT 31.6* 30.6*  PLT 281 200    BMET Recent Labs    04/02/18 0449 04/03/18 0432  NA 149* 144  K 3.6 3.5  CL 109 106  CO2 29 30  GLUCOSE 132* 128*  BUN 34* 25*  CREATININE 0.84 0.67  CALCIUM 9.2 8.9     Studies/Results: Ct Biopsy  Result Date: 04/01/2018 INDICATION: 84 year old with history of prostate and laryngeal cancer. The patient now has lung lesions and evidence for metastatic disease. Tissue diagnosis is needed. Lytic lesion involving the L3 left transverse process was targeted for biopsy. EXAM: CT-GUIDED CORE BIOPSY OF THE L3 TRANSVERSE PROCESS LESION MEDICATIONS: None. ANESTHESIA/SEDATION: Moderate (conscious) sedation was employed during this procedure. A total of Versed 1 mg and Fentanyl 25 mcg was administered intravenously. Moderate Sedation Time: 10 minutes. The patient's level of consciousness and vital signs were monitored continuously by radiology nursing throughout the procedure under my direct supervision. FLUOROSCOPY TIME:  None COMPLICATIONS: None immediate. PROCEDURE: Informed written consent was obtained from the patient after a thorough discussion of the procedural risks, benefits and alternatives. All questions were addressed. A timeout was performed prior to the initiation of the procedure. Patient was placed on his left side. CT images through the lumbar spine were obtained. The L3 left transverse process lesion was identified and targeted for biopsy. The back was prepped with chlorhexidine and sterile field was created. Skin and soft tissues were anesthetized with 1% lidocaine. 17 gauge coaxial needle was directed into the expansile lytic lesion with CT guidance. Core biopsies were obtained with an 18 gauge device. Specimens placed in formalin. Coaxial needle was removed without complication. Bandage placed over the puncture site. FINDINGS: Expansile lytic lesion involving the L3 left transverse process. Needle position confirmed within the lesion. Adequate  core biopsies were placed in formalin. IMPRESSION: CT-guided core biopsies of the L3 left transverse process lytic lesion. Electronically Signed   By: [name] M.D.   On: 04/01/2018 20:17    Medications: I have reviewed  the patient's current medications.  Assessment/Plan:   84 year old man with the following issues:  1.  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  These findings were discussed today with the patient and his family including the natural course of this disease as well as treatment options.  They all understand that this is an incurable malignancy with poor prognosis.  He is a poor candidate for any aggressive therapy but could be a marginal candidate for a palliative immunotherapy if his PDL 1 status is positive.  PDL 1 status is still pending and depending on these results he might be a candidate for immunotherapy.  The rationale for using immunotherapy as well as chemotherapy in this particular setting was reviewed.  Alternatively best supportive care and hospice was also discussed.  My recommendation would be against systemic chemotherapy and could consider immunotherapy if his medical status improves in the next few weeks.  If his condition continues to deteriorate, I will be in favor of supportive care and hospice enrollment.  I encouraged the family to discuss this further with palliative medicine team to clarify goals of care.  I have also recommended DO NOT RESUSCITATE CODE STATUS moving forward.  2.  Pain: I agree with the long-acting pain medication the form of fentanyl patch upon his discharge.  I have also recommended continuing palliative radiation to the hip moving forward.  3.  Disposition: To be difficult for him to transport back and forth to receive palliative radiation therapy.  He might require skilled nursing facility temporarily until he finished radiation.  All their questions were answered to their satisfaction.  I will continue to monitor his progress while he is hospitalized.  25  minutes was  spent with the patient face-to-face today.  More than 50% of time was dedicated to patient counseling, education and answering questions regarding diagnosis, prognosis and future plan of care.    LOS: 3 days   Zola Button 04/03/2018, 12:37 PM

## 2018-04-03 NOTE — Consult Note (Addendum)
Consultation Note Date: 04/03/2018   Patient Name: [name]  DOB: Feb 25, 1931  MRN: [id number]  Age / Sex: 84 y.o., male  PCP: Biagio Borg, MD Referring Physician: Charlynne Cousins, MD  Reason for Consultation: Establishing goals of care  HPI/Patient Profile: 84 y.o. male admitted on 03/30/2018 from home with complaints of shortness of breath with increased tracheal tube secretions and weakness with inability to walk. Patient has a significant medical history for laryngeal carcinoma status post tracheostomy (2009), hypertension, prostate cancer (2015) s/p radiation, GERD, back pain, and most recently diagnosed with left lower lobe lung cancer with metastatic lymphadenopathy and L3 vertebral body. He was brought to ER by his family (wife and daughter). Patient was recently placed on steroids but reports his appetite has been poor over the past few days prior to admission. He denied chest pain or abdominal pain. During ER course he was debrile, tachycardic, and hypoxic and placed on 100% non-rebreather.  CT angiogram of the chest shows bilateral consolidation.  Following the CT scan patient had increased secretions coming out of the tracheal stoma and NG tube placed shows frank blood aspirated.  Patient became more hypoxic desaturating on 100% nonrebreather. Daughter did not want patient intubated. Since admission patient is maintaining saturations with trach collar, he has been seen by Dr. 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 Patient and family has opted to proceed with immunotherapy if his medical status improves and to continue wth radiation. Palliative Medicine consulted for goals of care discussion.   Clinical Assessment and Goals of Care: I have reviewed medical records including lab results, imaging,  Epic notes, and MAR, received report from the bedside RN, and assessed the patient. I then met at the bedside with patient, his wife Stanton Kidney), his daughter Bailey Mech), and her husband to discuss diagnosis prognosis, GOC, EOL wishes, disposition and options. Patient had recently returned from radiation therapy. He denies any current pain or discomfort. He is alert and oriented x3. Patient is able to communicate by placing hand over trach valve and speaking.   I introduced Palliative Medicine as specialized medical care for people living with serious illness. It focuses on providing relief from the symptoms and stress of a serious illness. The goal is to improve quality of life for both the patient and the family.  We discussed a brief life review of the patient.  Patient has been married to his wife for more than 61 years. 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 They have the one daughter Shirlean Mylar who lives with her family in [place].  Mother reports patient is a very independent and living man.  He is known by many in his community for always giving back and helping others.  He is a man of [place].  He loves spending time with his family, volunteering in the church and community, and watching sports on TV.   As far as  functional and nutritional status patient reports he has been very functional over the past years despite his episodes with cancer and having trach.  He continued to be very involved in his church and in his community.  Approximately 3 weeks ago patient was mowing his lawn, taking walks every day, and driving.  Family states over the past 2 weeks they have noticed a decline in his health.  2 weeks ago he was unable to cut the grass and hired someone to do this for him.  He also became a little unsteady and was walking with a cane at times.  Wife states she is noticed a decrease in his appetite over the past month.  Family reports patient has lost about 25 pounds  over the course of the past 2 months.  Patient states he was having constant back pain over the past couple of months and continue to ignore and just self medicate.  Unfortunately over the past 2 weeks the pain had worsened and he started feeling more weaker.  Prior to admission patient and family reported he was only eaten small meals per day and is sometimes did not want to eat at all due to the pain.  We discussed his current illness and what it means in the larger context of his on-going co-morbidities.  Natural disease trajectory and expectations at EOL were discussed.  Family is aware of his current condition and metastatic disease.  They are hopeful that he will at least return to his baseline as it is difficult for them to accept given the fact that patient was so active several weeks ago.  They verbalized understanding of future road with potential immunotherapy and radiation.  At this time they are hopeful he can go to rehab and increase his strength and functional status with hopes of meeting expectations to start immunotherapy per Dr. Alen Blew.  They understand that this is an incurable malignancy with a poor prognosis as explained by Dr. Alen Blew however they remain hopeful for a little more time with him and for a decent quality of life.  I attempted to elicit values and goals of care important to the patient.    The difference between aggressive medical intervention and comfort care was considered in light of the patient's goals of care.  At this time family is not interested in comfort measures and would like to proceed with all medical interventions in regards to his care.  We discussed what comfort care would look like, and how the medical team will continue to treat the patient aggressively however it would be more symptom focused and making sure that he is able to feel as good as he can for as long as he can.  Family verbalized understanding and states they are aware that at some point they  will have to decide to shift to comfort care but they are not prepared to do that at this time as they want him to continue to have a fighting chance even if that means another few months.  Advanced directives, concepts specific to code status, artifical feeding and hydration, and rehospitalization were considered and discussed.  We discussed in detail about his current CODE STATUS.  Patient is currently a full code.  Patient states he does not want CPR and he does not want to be intubated.  He states he would only want to be shocked if necessary.  We discussed his wishes in great detail.  We discussed if the patient required defibrillation this is only performed  based on his cardiac status.  I explained in detail that if patient heart stop or that he would stop breathing, CPR would be necessary prior to defibrillation.  Patient and family verbalized understanding and states they thought medical team could just come in and shocked him to see if he would wake up.  Daughter jokingly said I guess we have the "Gray's anatomy mindset that he gets shocked and wakes back up".  I explained that is far from a real life scenario.  We discussed a code scenario in the event of respiratory and cardiac event.  Patient and family all verbalized understanding and appreciation.  After detailed discussion and education patient and family stated they would like to change his CODE STATUS to a DNR/DNI.  They are aware that medical staff will continue to care for patient with all measures and by changing his CODE STATUS this let the team know that if the patient was to naturally pass away we would not intervene with heroic measures.  Again family verbalized understanding and confirmed again they would like patient to be a DNR/DNI.  Patient is aware that nursing staff will come in and place a purple bracelet on patient's wrist to identify to all medical staff patient is wishes to be a DNR/DNI.  Family also verbalized  understanding.  Hospice and Palliative Care services outpatient were explained and offered.  Although patient does not qualify for hospice services at this time given his wishes to continue with aggressive treatment such as immunotherapy, radiation, and rehabilitation with still discussed hospice services with awareness that in the future he would need to transition to their services.  Family verbalized appreciation and understanding of the differences between the 2 services.  At this time they would like outpatient palliative care services to be involved in patient's care team.  Family is aware that once patient is prepared to transition to hospice they may communicate their needs to their palliative care team outpatient and they will assist with the transition.  Questions and concerns were addressed.  The family was encouraged to call with questions or concerns.  PMT will continue to support holistically.  PATIENT-she is alert and oriented x3 and capable of making medical decisions.  He verbalizes if he is unable to make medical decisions that his wife, Loman Logan and his daughter Mohid Furuya is his  HCPOA.    SUMMARY OF RECOMMENDATIONS    DNR/DNI-at patient and family's request.  Continue to treat the treatable while hospitalized.  Patient and family is hopeful he may receive some form of rehabilitation after discharge to assist with regaining strength and increase in functional status.  They are hopeful to be able to meet expectations to begin immunotherapy as explained by Dr. Alen Blew to continue with radiation therapy.  CSW consult for outpatient palliative services.  Family states that her goal is to go to SNF for rehab.  SLP consult for swallowing evaluation and possible MBS.  Patient and family has concerns that when patient is drinking liquids he sometimes cough or feel as if he is getting choked.  Reports this is fairly new over the past several months and only happens with  thin liquids.  Patient was started on fentanyl patch for long-acting pain control.  Patient is concerned about potential for constipation as he has had issues in the past with this.  Will order MiraLAX daily for bowel support given opioid use.  Palliative team will continue to support patient, patient's family, and medical team during hospitalization.  Code Status/Advance Care Planning:  DNR/DNI    Symptom Management:   Agree with fentanyl patch for long-acting pain control  MiraLAX daily for bowel regimen in addition to senna  Palliative Prophylaxis:   Aspiration, Bowel Regimen, Frequent Pain Assessment and Oral Care  Additional Recommendations (Limitations, Scope, Preferences):  Full Scope Treatment-continue to treat the treatable   Psycho-social/Spiritual:   Desire for further Chaplaincy support:NO   Prognosis:   Unable to determine-guarded to poor in the setting of newly diagnosed metastatic squamous cell lung cancer with bone mets, chronic back pain, decreased mobility, tracheostomy, poor po intake, weight loss (20-25lbs past 3 months), hypertension, acute respiratory failure with hypoxia, hx of prostate ca.   Discharge Planning: [facility] for rehab with Palliative care service follow-up      Primary Diagnoses: Present on Admission: . Acute respiratory failure with hypoxia ([place]) . Metastatic cancer to bone ([place]) . Hypothyroidism . Essential hypertension . Acute blood loss anemia . Acute respiratory failure ([place])   I have reviewed the medical record, interviewed the patient and family, and examined the patient. The following aspects are pertinent.  Past Medical History:  Diagnosis Date  . Cataract, immature   . Esophageal stenosis 05/2016  . Full dentures   . GERD (gastroesophageal reflux disease)   . History of cancer of larynx 2009  . History of prostate cancer 2015  . History of radiation therapy 2015   for prostate cancer  .  Hypertension    states under control with meds., has been on med. x 5-6 yr.  . 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 Marital status: Married    Spouse name: Not on file  . Number of children: 1  . Years of education: Not on file  . Highest education level: Not on file  Occupational History  . Occupation: Retired  Scientific laboratory technician  . Financial resource strain: Not on file  . Food insecurity:    Worry: Not on file    Inability: Not on file  . Transportation needs:    Medical: Not on file    Non-medical: Not on file  Tobacco Use  . Smoking status: Former Smoker    Years: 25.00    Last attempt to quit: 01/07/1987    Years since quitting: 31.2  . Smokeless tobacco: Never Used  Substance and Sexual Activity  . Alcohol use: No  . Drug use: No  . Sexual activity: Not on file  Lifestyle  . Physical activity:    Days per week: Not on file    Minutes per session: Not on file  . Stress: Not on file  Relationships  . Social connections:    Talks on phone: Not on file    Gets together: Not on file    Attends religious service: Not on file    Active member of club or organization: Not on file    Attends meetings of clubs or organizations: Not on file    Relationship status: Not on file  Other Topics Concern  . Not on file  Social History Narrative   Daily caffeine    Family History  Problem Relation Age of Onset  . Hypertension Father   . Heart attack Father 54  . Diabetes Brother   . Diabetes Brother   . Colon cancer Neg Hx    Scheduled Meds: . Chlorhexidine Gluconate Cloth  6 each Topical Q0600  . dexamethasone  4 mg Oral BID WC  .  fentaNYL  50 mcg Transdermal Q72H  . levofloxacin  500 mg Oral Q24H  . mupirocin ointment  1 application Nasal BID  . pantoprazole sodium  40 mg Per Tube BID  . [START ON 04/04/2018] polyethylene glycol  17 g Oral Daily  . potassium chloride  20 mEq Oral BID  . senna-docusate  1 tablet Oral BID  .  tamsulosin  0.4 mg Oral Daily   Continuous Infusions: . dextrose 75 mL/hr at 04/02/18 1246   PRN Meds:.acetaminophen, acetaminophen, HYDROcodone-acetaminophen, LORazepam, morphine injection, ondansetron **OR** ondansetron (ZOFRAN) IV Medications Prior to Admission:  Prior to Admission medications   Medication Sig Start Date End Date Taking? Authorizing Provider  amLODipine (NORVASC) 5 MG tablet Take 5 mg by mouth daily.    Yes [provider]  aspirin 81 MG tablet Take 81 mg by mouth daily.   Yes [provider]  HYDROcodone-acetaminophen (NORCO) 7.5-325 MG tablet Take 1 tablet by mouth every 6 (six) hours as needed for moderate pain. 03/10/18  Yes Biagio Borg, MD  lisinopril (PRINIVIL,ZESTRIL) 40 MG tablet Take 40 mg by mouth daily.    Yes [provider]  morphine (MS CONTIN) 30 MG 12 hr tablet Take 1 tablet (30 mg total) by mouth every 12 (twelve) hours. 03/25/18  Yes Gery Pray, MD  Multiple Vitamins-Minerals (MULTIVITAMIN ADULT PO) Take by mouth.   Yes [provider]  senna-docusate (SENNA S) 8.6-50 MG tablet Take 1 tablet by mouth 2 (two) times daily. Patient taking differently: Take 1 tablet by mouth 2 (two) times daily as needed for mild constipation.  03/24/18  Yes [name], MD  tamsulosin (FLOMAX) 0.4 MG CAPS capsule Take 1 capsule (0.4 mg total) by mouth daily. 11/03/16  Yes Shary Decamp, PA-C  Vitamin D, Ergocalciferol, (DRISDOL) 50000 units CAPS capsule Take 50,000 Units by mouth every 7 (seven) days.   Yes [provider]  dexamethasone (DECADRON) 4 MG tablet Take 1 tablet (4 mg total) by mouth 2 (two) times daily with a meal. Patient not taking: Reported on 03/31/2018 03/26/18   Gery Pray, MD  oxyCODONE (OXY IR/ROXICODONE) 5 MG immediate release tablet Take 1 tablet (5 mg total) by mouth every 4 (four) hours as needed for severe pain. Patient not taking: Reported on 03/25/2018 03/24/18   [name], MD  pantoprazole  (PROTONIX) 40 MG tablet Take 40 mg by mouth daily.    [provider]  predniSONE (DELTASONE) 10 MG tablet 3 tabs by mouth per day for 3 days,2tabs per day for 3 days,21tab per day for 3 days Patient not taking: Reported on 03/25/2018 02/12/18   Biagio Borg, MD  ranitidine (ZANTAC) 150 MG capsule Take 150 mg by mouth every evening.     [provider]  tiZANidine (ZANAFLEX) 2 MG tablet Take 1 tablet (2 mg total) by mouth every 6 (six) hours as needed for muscle spasms. Patient not taking: Reported on 03/25/2018 02/12/18   Biagio Borg, MD   Allergies  Allergen Reactions  . Terazosin Other (See Comments)    DIZZINESS  . Levothyroxine Other (See Comments)   Review of Systems  Constitutional: Positive for activity change, appetite change, fatigue and unexpected weight change.  Neurological: Positive for weakness.  All other systems reviewed and are negative.   Physical Exam  Constitutional: He is oriented to person, place, and time. Vital signs are normal. He is cooperative. He has a sickly appearance.  Thin and frail in appearance   Cardiovascular:  Normal rate, regular rhythm, normal heart sounds and normal pulses.  Pulmonary/Chest: Effort normal. He has decreased breath sounds.  Tracheostomy with trach collar 35% FiO2 8L  Abdominal: Normal appearance and bowel sounds are normal. There is tenderness.  Musculoskeletal:  Generalized weakness   Neurological: He is alert and oriented to person, place, and time. He displays atrophy.  Skin: Skin is warm and dry.  Psychiatric: He has a normal mood and affect. His speech is normal and behavior is normal. Judgment and thought content normal. Cognition and memory are normal.  Nursing note and vitals reviewed.   Vital Signs: BP 119/62 (BP Location: Left Arm)   Pulse 95   Temp 98.5 F (36.9 C) (Oral)   Resp 15   Ht 5' 9.02" (1.753 m)   Wt 84.1 kg (185 lb 6.5 oz)   SpO2 (!) 87%   BMI 27.37 kg/m  Pain Scale: 0-10   Pain  Score: 5    SpO2: SpO2: (!) 87 % O2 Device:SpO2: (!) 87 % O2 Flow Rate: .O2 Flow Rate (L/min): 10 L/min  IO: Intake/output summary:   Intake/Output Summary (Last 24 hours) at 04/03/2018 1656 Last data filed at 04/03/2018 1600 Gross per 24 hour  Intake 2042.5 ml  Output 500 ml  Net 1542.5 ml    LBM: Last BM Date: 03/31/18 Baseline Weight: Weight: 84.1 kg (185 lb 6.5 oz) Most recent weight: Weight: 84.1 kg (185 lb 6.5 oz)     Palliative Assessment/Data: PPS 40%   Time In: 1315 Time Out: 1645 Time Total: 90 min.   Greater than 50%  of this time was spent counseling and coordinating care related to the above assessment and plan.  Signed by:  Alda Lea, NP-BC Palliative Medicine Team  Phone: [phone] Fax: [phone] Pager: [phone] Amion: Bjorn Pippin    Please contact Palliative Medicine Team phone at [phone] for questions and concerns.  For individual provider: See Shea Evans

## 2018-04-03 NOTE — Progress Notes (Addendum)
[facility] PROGRESS NOTE    Progress Note  [name]  YWV:[id number] DOB: 1931/04/22 DOA: 03/30/2018 PCP: Biagio Borg, MD     Brief Narrative:   [name] is an 84 y.o. male past medical history of laryngeal carcinoma status post tracheostomy, prostate cancer last PSA 0.15, for which he completed his radiation therapy in 2015 with a new lung mass, he was noted to have increased back pain and MRI of the lumbar spine on 02/26/2018 show foci metastatic disease to L3 vertebrae by tumor.  PET scan obtained on 03/17/2018 show 5.4 cm mass in the posterior left lower lobe of the lung, with multiple metastatic lymphadenopathy bilaterally in the mediastinum and increase radiotracer uptake throughout the spine sternum and pelvis.  Brought into the ED for progressive weakness shortness of breath and increased secretion through his tracheal tube.  CT chest showed multilobar PNA. The patient initially wanted to be a DNR but has reversed it to full code.  From HCAP standpoint he is improving, we have changed him to oral levaquin stop date 7.24.2019. He had biopsy on 7.17.2019, awaiting result. Hospice and palliative care to meet with patient once biopsy results are back  Assessment/Plan:   Acute respiratory failure with hypoxia ([place]) due to HCAP (healthcare-associated pneumonia): Cont oral levaquin stopped date 7.24.2019 Cultures has remained negative. Monitor strict I & O's.  Essential hypertension: Hold antihypertensive medication.  Hypothyroidism: Continue Synthroid.  Metastatic cancer to bone [facility]): Continue oxycodone for pain, status post biopsy on 04/01/2018 see details as below. Narcotic regimen changed to fentanyl and liq Norco as needed.  New lung mass metastatic lytic lesions: Oncology was consulted status post biopsy of L3 transverse process on 04/02/2018. Pain is control.  Acute blood loss anemia: Had some mild hemoptysis on admission now resolved, hbg is now  stable.  New hypernatremia: Resolved with IV fluid.   Advance directives: Oncology recommended  And I agree with  No vent support. Awaiting PMT recommendations, meeting will happen after biopsy result back.  DVT prophylaxis: SCd's Family Communication:daughter and wife Disposition Plan/Barrier to D/C: Hopefully home in 1-2 days Code Status:     Code Status Orders  (From admission, onward)        Start     Ordered   03/31/18 1111  Full code  Continuous     03/31/18 1113    Code Status History    Date Active Date Inactive Code Status Order ID Comments User Context   03/31/2018 0550 03/31/2018 1113 DNR [id number]  Rise Patience, MD ED        IV Access:    Peripheral IV   Procedures and diagnostic studies:   Ct Biopsy  Result Date: 04/01/2018 INDICATION: 84 year old with history of prostate and laryngeal cancer. The patient now has lung lesions and evidence for metastatic disease. Tissue diagnosis is needed. Lytic lesion involving the L3 left transverse process was targeted for biopsy. EXAM: CT-GUIDED CORE BIOPSY OF THE L3 TRANSVERSE PROCESS LESION MEDICATIONS: None. ANESTHESIA/SEDATION: Moderate (conscious) sedation was employed during this procedure. A total of Versed 1 mg and Fentanyl 25 mcg was administered intravenously. Moderate Sedation Time: 10 minutes. The patient's level of consciousness and vital signs were monitored continuously by radiology nursing throughout the procedure under my direct supervision. FLUOROSCOPY TIME:  None COMPLICATIONS: None immediate. PROCEDURE: Informed written consent was obtained from the patient after a thorough discussion of the procedural risks, benefits and alternatives. All questions were addressed. A timeout was performed prior to the initiation  of the procedure. Patient was placed on his left side. CT images through the lumbar spine were obtained. The L3 left transverse process lesion was identified and targeted for biopsy. The  back was prepped with chlorhexidine and sterile field was created. Skin and soft tissues were anesthetized with 1% lidocaine. 17 gauge coaxial needle was directed into the expansile lytic lesion with CT guidance. Core biopsies were obtained with an 18 gauge device. Specimens placed in formalin. Coaxial needle was removed without complication. Bandage placed over the puncture site. FINDINGS: Expansile lytic lesion involving the L3 left transverse process. Needle position confirmed within the lesion. Adequate core biopsies were placed in formalin. IMPRESSION: CT-guided core biopsies of the L3 left transverse process lytic lesion. Electronically Signed   By: [name] M.D.   On: 04/01/2018 20:17     Medical Consultants:    None.  Anti-Infectives:   IV Vanc and cefepime  Subjective:    [name]  feel much better than yesterday with more strength and working with PT.  Objective:    Vitals:   04/02/18 2133 04/03/18 0446 04/03/18 0553 04/03/18 0731  BP: 112/70  121/69   Pulse: 85  88 90  Resp: 17  17 16   Temp: 98.1 F (36.7 C)  98.5 F (36.9 C)   TempSrc: Oral  Oral   SpO2: 98% 95% 92% 92%  Weight:      Height:        Intake/Output Summary (Last 24 hours) at 04/03/2018 1011 Last data filed at 04/03/2018 0226 Gross per 24 hour  Intake -  Output 400 ml  Net -400 ml   Filed Weights   03/31/18 2155  Weight: 84.1 kg (185 lb 6.5 oz)    Exam: General exam: In no acute distress cachectic Respiratory system: Good air movement and clear to auscultation. Cardiovascular system: S1 & S2 heard, RRR. Gastrointestinal system: Abdomen is nondistended, soft and nontender.  Central nervous system: Alert and oriented. No focal neurological deficits. Extremities: No pedal edema. Skin: No rashes, lesions or ulcers Psychiatry: Judgement and insight appear normal. Mood & affect appropriate.    Data Reviewed:    Labs: Basic Metabolic Panel: Recent Labs  Lab 03/31/18 0010  04/01/18 0422 04/02/18 0449 04/03/18 0432  NA 145 149* 149* 144  K 3.0* 3.9 3.6 3.5  CL 102 110 109 106  CO2 30 27 29 30   GLUCOSE 132* 146* 132* 128*  BUN 21 31* 34* 25*  CREATININE 0.80 1.06 0.84 0.67  CALCIUM 9.4 9.1 9.2 8.9   GFR Estimated Creatinine Clearance: 67.5 mL/min (by C-G formula based on SCr of 0.67 mg/dL). Liver Function Tests: Recent Labs  Lab 03/31/18 0010  AST 52*  ALT 40  ALKPHOS 141*  BILITOT 1.0  PROT 6.2*  ALBUMIN 2.5*   No results for input(s): LIPASE, AMYLASE in the last 168 hours. No results for input(s): AMMONIA in the last 168 hours. Coagulation profile Recent Labs  Lab 03/31/18 1136  INR 1.40    CBC: Recent Labs  Lab 03/31/18 0010 03/31/18 1136 04/01/18 0422 04/02/18 0449 04/03/18 0432  WBC 17.4* 16.6* 16.7* 15.7* 13.5*  NEUTROABS 16.0* 15.4*  --   --   --   HGB 11.0* 11.0* 10.2* 9.4* 9.5*  HCT 35.1* 35.3* 32.9* 31.6* 30.6*  MCV 85.4 85.1 85.5 87.1 84.3  PLT 329 274 295 281 200   Cardiac Enzymes: No results for input(s): CKTOTAL, CKMB, CKMBINDEX, TROPONINI in the last 168 hours. BNP (last 3 results) No  results for input(s): PROBNP in the last 8760 hours. CBG: No results for input(s): GLUCAP in the last 168 hours. D-Dimer: No results for input(s): DDIMER in the last 72 hours. Hgb A1c: No results for input(s): HGBA1C in the last 72 hours. Lipid Profile: No results for input(s): CHOL, HDL, LDLCALC, TRIG, CHOLHDL, LDLDIRECT in the last 72 hours. Thyroid function studies: No results for input(s): TSH, T4TOTAL, T3FREE, THYROIDAB in the last 72 hours.  Invalid input(s): FREET3 Anemia work up: No results for input(s): VITAMINB12, FOLATE, FERRITIN, TIBC, IRON, RETICCTPCT in the last 72 hours. Sepsis Labs: Recent Labs  Lab 03/31/18 1136 03/31/18 1137 03/31/18 1424 04/01/18 0422 04/02/18 0449 04/03/18 0432  PROCALCITON 7.40  --   --   --   --   --   WBC 16.6*  --   --  16.7* 15.7* 13.5*  LATICACIDVEN  --  1.3 1.5  --   --    --    Microbiology Recent Results (from the past 240 hour(s))  Culture, blood (routine x 2) Call MD if unable to obtain prior to antibiotics being given     Status: None (Preliminary result)   Collection Time: 03/31/18 11:36 AM  Result Value Ref Range Status   Specimen Description   Final    BLOOD RIGHT ANTECUBITAL Performed at [facility] [address] [place], [place] [address]    Special Requests   Final    BOTTLES DRAWN AEROBIC AND ANAEROBIC Blood Culture adequate volume Performed at [facility] [address] [place], [place] [address]    Culture   Final    NO GROWTH 3 DAYS Performed at [facility], [place] [address], [place], [place] [address]    Report Status PENDING  Incomplete  Culture, blood (routine x 2) Call MD if unable to obtain prior to antibiotics being given     Status: None (Preliminary result)   Collection Time: 03/31/18 11:44 AM  Result Value Ref Range Status   Specimen Description   Final    BLOOD RIGHT HAND Performed at [facility] [address], [place], [place] [address]    Special Requests   Final    BOTTLES DRAWN AEROBIC AND ANAEROBIC Blood Culture adequate volume Performed at [facility] [address], [place], [place] [address]    Culture   Final    NO GROWTH 3 DAYS Performed at [facility], [place] [address], [place], [place] [address]    Report Status PENDING  Incomplete  MRSA PCR Screening     Status: Abnormal   Collection Time: 04/01/18  2:15 PM  Result Value Ref Range Status   MRSA by PCR POSITIVE (A) NEGATIVE Final    Comment:        The GeneXpert MRSA Assay (FDA approved for NASAL specimens only), is one component of a comprehensive MRSA colonization surveillance program. It is not intended to diagnose MRSA infection nor to guide or monitor treatment for MRSA infections. RESULT CALLED TO, READ BACK BY AND VERIFIED WITH: Vincenza Hews  [id number] @ [address] [place] Performed at [facility] [address], [place],  [address]      Medications:   . dexamethasone  4 mg Oral BID WC  . fentaNYL  50 mcg Transdermal Q72H  . levofloxacin  500 mg Oral Q24H  . pantoprazole sodium  40 mg Per Tube BID  . potassium chloride  20 mEq Oral BID  .  senna-docusate  1 tablet Oral BID  . tamsulosin  0.4 mg Oral Daily   Continuous Infusions: . dextrose 75 mL/hr at 04/02/18 1246      LOS: 3 days   [facility] Pager [phone]  *Please refer to [email or web address], password [email or web address] to get updated schedule on who will round on this patient, as hospitalists switch teams weekly. If 7PM-7AM, please contact night-coverage at [email or web address], password [email or web address] for any overnight needs.  04/03/2018, 10:11 AM

## 2018-04-03 NOTE — Progress Notes (Signed)
Pt from home with wife and was independent with ADLs prior to this admission. This CM attempted to talk with pt and daughter about DC planning. Daughter states that Palliative Care is to meet with them this afternoon and they will have a better understanding of DC plans after that conversation. CM will continue to follow. Marney Doctor RN,BSN,NCM [phone]

## 2018-04-03 NOTE — Progress Notes (Addendum)
Discussed disposition plans with pt and family along with CM- seeking SNF for short term rehab. Pt would need transportation to radiation appointments and CSW included this information in SNF referrals. Completed FL2 and obtained PASRR approved. Will follow up with bed offers.  Sharren Bridge, MSW, LCSW Clinical Social Work 04/03/2018 [phone]  7/20- received consult for outpatient palliative referral. [facility] working toward SNF placement for pt, see above, will include recommendation for palliative care in DC information when pt transitions to SNF.

## 2018-04-03 NOTE — Progress Notes (Signed)
This CM met with pt and family at bedside along with CSW. Pt expressed desire for placement at  rehab during radiation period prior to DC home. CSW to fax out pt info for bed offers. Marney Doctor RN,BSN,NCM [phone]

## 2018-04-03 NOTE — NC FL2 (Signed)
[place] LEVEL OF CARE SCREENING TOOL     IDENTIFICATION  Patient Name: [name] Birthdate: 09-14-1931 Sex: male Admission Date (Current Location): 03/30/2018  [facility] and [place] Number:  Herbalist and Address:  [facility],  [place], [place]      Provider Number: [id number]  Attending Physician Name and Address:  Charlynne Cousins, MD  Relative Name and Phone Number:       Current Level of Care: Hospital Recommended Level of Care: [place] Prior Approval Number:    Date Approved/Denied:   PASRR Number: [id number]  Discharge Plan: SNF    Current Diagnoses: Patient Active Problem List   Diagnosis Date Noted  . Hypernatremia 04/01/2018  . Acute respiratory failure with hypoxia ([place]) 03/31/2018  . Acute GI bleeding 03/31/2018  . Acute blood loss anemia 03/31/2018  . Acute respiratory failure ([place]) 03/31/2018  . HCAP (healthcare-associated pneumonia) 03/31/2018  . Metastatic cancer to bone ([place]) 03/10/2018  . Left lumbar radiculopathy 02/12/2018  . Hypothyroidism 08/05/2017  . Abnormal TSH 01/29/2017  . Dizziness 04/12/2016  . Back pain 01/20/2015  . Insomnia 01/20/2015  . Malignant neoplasm of prostate ([place]) 02/22/2014  . Hematochezia 12/10/2011  . Abdominal pain, other specified site 12/10/2011  . Laryngeal cancer ([place]) 11/10/2011  . Diarrhea 11/10/2011  . Impaired glucose tolerance   . Preventative health care 11/03/2011  . Hyperlipidemia 08/29/2008  . ANXIETY 08/29/2008  . Essential hypertension 08/29/2008  . ALLERGIC RHINITIS 08/29/2008  . DIVERTICULOSIS, COLON 08/29/2008  . GERD 08/20/2007  . BENIGN PROSTATIC HYPERTROPHY 08/20/2007    Orientation RESPIRATION BLADDER Height & Weight     Self, Situation, Place, Time  Tracheostomy  Uncuffed, 10L/min, 40% PRN suctioning Continent, Indwelling catheter Weight: 185 lb 6.5 oz (84.1 kg) Height:  5' 9.02" (175.3 cm)   BEHAVIORAL SYMPTOMS/MOOD NEUROLOGICAL BOWEL NUTRITION STATUS      Continent  Diet(soft diet)  AMBULATORY STATUS COMMUNICATION OF NEEDS Skin   Extensive Assist Verbally Normal                       Personal Care Assistance Level of Assistance  Bathing, Feeding, Dressing Bathing Assistance: Limited assistance Feeding assistance: Limited assistance Dressing Assistance: Limited assistance     Functional Limitations Info  Sight, Hearing, Speech Sight Info: Adequate Hearing Info: Adequate Speech Info: Adequate    SPECIAL CARE FACTORS FREQUENCY  PT (By licensed PT), OT (By licensed OT)     PT Frequency: 5x OT Frequency: 5x            Contractures Contractures Info: Not present    Additional Factors Info  Code Status, Allergies, Isolation Precautions Code Status Info: full code Allergies Info: Terazosin, Levothyroxine     Isolation Precautions Info: contact precautions MRSA     Current Medications (04/03/2018):  This is the current hospital active medication list Current Facility-Administered Medications  Medication Dose Route Frequency Provider Last Rate Last Dose  . acetaminophen (TYLENOL) suppository 650 mg  650 mg Rectal Q4H PRN Charlynne Cousins, MD      . acetaminophen (TYLENOL) tablet 650 mg  650 mg Oral Q6H PRN Charlynne Cousins, MD      . Chlorhexidine Gluconate Cloth 2 % PADS 6 each  6 each Topical Q0600 Charlynne Cousins, MD      . dexamethasone (DECADRON) tablet 4 mg  4 mg Oral BID WC Charlynne Cousins, MD   4 mg at 04/03/18  0375  . dextrose 5 % solution   Intravenous Continuous Charlynne Cousins, MD 75 mL/hr at 04/02/18 1246    . fentaNYL (DURAGESIC - dosed mcg/hr) 50 mcg  50 mcg Transdermal Q72H Charlynne Cousins, MD   50 mcg at 04/03/18 1216  . HYDROcodone-acetaminophen (HYCET) 7.5-325 mg/15 ml solution 10 mL  10 mL Oral Q4H PRN Charlynne Cousins, MD   10 mL at 04/03/18 1301  . levofloxacin (LEVAQUIN) tablet 500 mg  500 mg Oral Q24H  Charlynne Cousins, MD   500 mg at 04/03/18 1219  . LORazepam (ATIVAN) injection 0.5 mg  0.5 mg Intravenous Q4H PRN Gardiner Barefoot, NP      . morphine 4 MG/ML injection 4 mg  4 mg Intravenous Q4H PRN Charlynne Cousins, MD   4 mg at 04/03/18 1158  . mupirocin ointment (BACTROBAN) 2 % 1 application  1 application Nasal BID Charlynne Cousins, MD      . ondansetron [facility]) tablet 4 mg  4 mg Oral Q6H PRN Rise Patience, MD       Or  . ondansetron [facility]) injection 4 mg  4 mg Intravenous Q6H PRN Rise Patience, MD      . pantoprazole sodium (PROTONIX) 40 mg/20 mL oral suspension 40 mg  40 mg Per Tube BID Charlynne Cousins, MD      . potassium chloride 20 MEQ/15ML (10%) solution 20 mEq  20 mEq Oral BID Charlynne Cousins, MD   20 mEq at 04/03/18 1138  . senna-docusate (Senokot-S) tablet 1 tablet  1 tablet Oral BID Charlynne Cousins, MD   1 tablet at 04/03/18 1217  . tamsulosin (FLOMAX) capsule 0.4 mg  0.4 mg Oral Daily Charlynne Cousins, MD   0.4 mg at 04/02/18 4360     Discharge Medications: Please see discharge summary for a list of discharge medications.  Relevant Imaging Results:  Relevant Lab Results:   Additional Information SS# [id number]. Radiation appointments at [facility] - needs transportation- will provide schedule  Nila Nephew, LCSW

## 2018-04-03 NOTE — Care Management Important Message (Addendum)
Important Message  Patient Details IM Letter given to Nora/Case Manager to present to the patient Name: [name] MRN: [id number] Date of Birth: 1930-11-29   Medicare Important Message Given:  Yes    Kerin Salen 04/03/2018, 11:38 AMImportant Message  Patient Details  Name: [name] MRN: [id number] Date of Birth: 1931-02-04   Medicare Important Message Given:  Yes    Kerin Salen 04/03/2018, 11:38 AM

## 2018-04-04 LAB — BASIC METABOLIC PANEL
Anion gap: 11 (ref 5–15)
BUN: 19 mg/dL (ref 8–23)
CO2: 30 mmol/L (ref 22–32)
CREATININE: 0.65 mg/dL (ref 0.61–1.24)
Calcium: 9.1 mg/dL (ref 8.9–10.3)
Chloride: 105 mmol/L (ref 98–111)
GFR calc Af Amer: >60 mL/min (ref >60)
Glucose, Bld: 115 mg/dL — ABNORMAL HIGH (ref 70–99)
Potassium: 3.6 mmol/L (ref 3.5–5.1)
SODIUM: 146 mmol/L — AB (ref 135–145)

## 2018-04-04 MED ORDER — HYDROCODONE-ACETAMINOPHEN 7.5-325 MG/15ML PO SOLN
15.0000 mL | ORAL | Status: DC | PRN
  Administered 2018-04-04 – 2018-04-05 (×2): 15 mL via ORAL
  Filled 2018-04-04 (×2): qty 15

## 2018-04-04 MED ORDER — HYDROMORPHONE HCL 1 MG/ML IJ SOLN
1.0000 mg | INTRAMUSCULAR | Status: DC | PRN
  Administered 2018-04-04 – 2018-04-05 (×5): 1 mg via INTRAVENOUS
  Filled 2018-04-04 (×5): qty 1

## 2018-04-04 MED ORDER — OXYCODONE HCL 5 MG PO TABS
10.0000 mg | ORAL_TABLET | Freq: Once | ORAL | Status: AC
  Administered 2018-04-04: 10 mg via ORAL
  Filled 2018-04-04: qty 2

## 2018-04-04 MED ORDER — HYDROMORPHONE HCL 1 MG/ML IJ SOLN
0.5000 mg | Freq: Once | INTRAMUSCULAR | Status: AC
  Administered 2018-04-04: 0.5 mg via INTRAVENOUS
  Filled 2018-04-04: qty 1

## 2018-04-04 NOTE — Progress Notes (Signed)
OT Cancellation Note  Patient Details Name: Garnie Borchardt MRN: [id number] DOB: 10/02/1930   Cancelled Treatment:    Reason Eval/Treat Not Completed: Pain limiting ability to participate.  Will try to check back tomorrow am.  [facility] 04/04/2018, 2:28 PM  Lesle Chris, OTR/L [phone] 04/04/2018

## 2018-04-04 NOTE — Progress Notes (Signed)
OT Cancellation Note  Patient Details Name: [name] MRN: [id number] DOB: 1931/07/04   Cancelled Treatment:    Reason Eval/Treat Not Completed: Pain limiting ability to participate per RN. Will try to check back this afternoon.    America Sandall 04/04/2018, 10:22 AM  Lesle Chris, OTR/L [phone] 04/04/2018

## 2018-04-04 NOTE — Progress Notes (Signed)
PROGRESS NOTE    [name]  ZOX:[id number] DOB: 23-Jun-1931 DOA: 03/30/2018 PCP: Biagio Borg, MD  Brief Narrative: 84 y.o. male past medical history of laryngeal carcinoma status post tracheostomy, prostate cancer last PSA 0.15, for which he completed his radiation therapy in 2015 with a new lung mass, he was noted to have increased back pain and MRI of the lumbar spine on 02/26/2018 show foci metastatic disease to L3 vertebrae by tumor.  PET scan obtained on 03/17/2018 show 5.4 cm mass in the posterior left lower lobe of the lung, with multiple metastatic lymphadenopathy bilaterally in the mediastinum and increase radiotracer uptake throughout the spine sternum and pelvis.  Brought into the ED for progressive weakness shortness of breath and increased secretion through his tracheal tube.  CT chest showed multilobar PNA. The patient initially wanted to be a DNR but has reversed it to full code.  From HCAP standpoint he is improving, we have changed him to oral levaquin stop date 7.24.2019. He had biopsy on 7.17.2019, awaiting result. Hospice and palliative care to meet with patient once biopsy results are back     Assessment & Plan:   Principal Problem:   Acute respiratory failure with hypoxia (HCC) Active Problems:   Essential hypertension   Hypothyroidism   Metastatic cancer to bone (HCC)   Acute blood loss anemia   Acute respiratory failure (HCC)   HCAP (healthcare-associated pneumonia)   Hypernatremia  Acute respiratory failure with hypoxia ([place]) due to HCAP (healthcare-associated pneumonia): Cont oral levaquin stopped date 7.24.2019 Cultures has remained negative. Monitor strict I & O's.  Essential hypertension:stable not on any antihypertensives.   Hypothyroidism: Continue Synthroid.  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 Narcotic regimen changed to fentanyl and liq Norco as needed.  Patient and family reports the pain has not been controlled with fentanyl or Norco.  I have increased the dose of Norco to 15 mL and given him a dose of Dilaudid to see how that will help.  Depending on the response I will  at this his pain management.  New lung mass metastatic lytic lesions: Oncology was consulted status post biopsy of L3 transverse process on 04/02/2018.  Acute blood loss anemia: Had some mild hemoptysis on admission now resolved, hbg is now stable.   hypernatremia: Resolved with IV fluid.     DVT prophylaxis:scd Code Status: dnr Family Communication: Discussed with patient's wife and granddaughter. Disposition Plan: Possible discharge to skilled nursing facility with palliative care on board.  Await palliative care consult.  Patient to also have palliative radiation arranged for pain control.  Consultants:  onc Procedures: Biopsy of L3 transverse process 04/02/2018. Antimicrobials: Levofloxacin. Subjective: Resting in bed complaining of back pain family by the bedside reports the pain is 9 out of 10 is not sure if the Norco helped.   Objective: Vitals:   04/03/18 2123 04/04/18 0051 04/04/18 0424 04/04/18 0942  BP:   127/77   Pulse:   63 94  Resp:   16 16  Temp:   98.2 F (36.8 C)   TempSrc:   Oral   SpO2: 94% 91% 92% (!) 88%  Weight:      Height:        Intake/Output Summary (Last 24 hours) at 04/04/2018 1019 Last data filed at 04/04/2018 0338 Gross per 24 hour  Intake 3395 ml  Output 400 ml  Net 2995 ml   Filed Weights   03/31/18 2155  Weight: 84.1 kg (185 lb 6.5 oz)    Examination:  General exam mild distress. Respiratory system: Clear to auscultation. Respiratory effort normal. Cardiovascular system: S1 & S2 heard, RRR. No JVD, murmurs, rubs, gallops or clicks. No pedal edema. Gastrointestinal system: Abdomen is nondistended, soft and nontender. No organomegaly or  masses felt. Normal bowel sounds heard. Central nervous system: Alert and oriented. No focal neurological deficits. Extremities: Symmetric 5 x 5 power. Skin: No rashes, lesions or ulcers Psychiatry: Judgement and insight appear normal. Mood & affect appropriate.     Data Reviewed: I have personally reviewed following labs and imaging studies  CBC: Recent Labs  Lab 03/31/18 0010 03/31/18 1136 04/01/18 0422 04/02/18 0449 04/03/18 0432  WBC 17.4* 16.6* 16.7* 15.7* 13.5*  NEUTROABS 16.0* 15.4*  --   --   --   HGB 11.0* 11.0* 10.2* 9.4* 9.5*  HCT 35.1* 35.3* 32.9* 31.6* 30.6*  MCV 85.4 85.1 85.5 87.1 84.3  PLT 329 274 295 281 831   Basic Metabolic Panel: Recent Labs  Lab 03/31/18 0010 04/01/18 0422 04/02/18 0449 04/03/18 0432 04/04/18 0403  NA 145 149* 149* 144 146*  K 3.0* 3.9 3.6 3.5 3.6  CL 102 110 109 106 105  CO2 30 27 29 30 30   GLUCOSE 132* 146* 132* 128* 115*  BUN 21 31* 34* 25* 19  CREATININE 0.80 1.06 0.84 0.67 0.65  CALCIUM 9.4 9.1 9.2 8.9 9.1   GFR: Estimated Creatinine Clearance: 67.5 mL/min (by C-G formula based on SCr of 0.65 mg/dL). Liver Function Tests: Recent Labs  Lab 03/31/18 0010  AST 52*  ALT 40  ALKPHOS 141*  BILITOT 1.0  PROT 6.2*  ALBUMIN 2.5*   No results for input(s): LIPASE, AMYLASE in the last 168 hours. No results for input(s): AMMONIA in the last 168 hours. Coagulation Profile: Recent Labs  Lab 03/31/18 1136  INR 1.40   Cardiac Enzymes: No results for input(s): CKTOTAL, CKMB, CKMBINDEX, TROPONINI in the last 168 hours. BNP (last 3 results) No results for input(s): PROBNP in the last 8760 hours. HbA1C: No results for input(s): HGBA1C in the last 72 hours. CBG: No results for input(s): GLUCAP in the last 168 hours. Lipid Profile: No results for input(s): CHOL, HDL, LDLCALC, TRIG, CHOLHDL, LDLDIRECT in the last 72 hours. Thyroid Function Tests: No results for input(s): TSH, T4TOTAL, FREET4, T3FREE, THYROIDAB in the last  72 hours. Anemia Panel: No results for input(s): VITAMINB12, FOLATE, FERRITIN, TIBC, IRON, RETICCTPCT in the last 72 hours. Sepsis Labs: Recent Labs  Lab 03/31/18 1136 03/31/18 1137 03/31/18 1424  PROCALCITON 7.40  --   --   LATICACIDVEN  --  1.3 1.5    Recent Results (from the past 240 hour(s))  Culture, blood (routine x 2) Call MD if unable to obtain prior to antibiotics being given     Status: None (Preliminary result)   Collection Time: 03/31/18 11:36 AM  Result Value Ref Range Status   Specimen Description   Final    BLOOD RIGHT ANTECUBITAL Performed at [facility] [address] [place], [place] [address]    Special Requests   Final    BOTTLES DRAWN AEROBIC AND ANAEROBIC Blood Culture adequate volume Performed at [facility] [address], [place], [place] [address]    Culture   Final    NO GROWTH 3 DAYS Performed at [facility], [address] [place],  [place] [address]    Report Status PENDING  Incomplete  Culture, blood (routine x 2) Call MD if unable to obtain prior to antibiotics being given     Status: None (Preliminary result)   Collection Time: 03/31/18 11:44 AM  Result Value Ref Range Status   Specimen Description   Final    BLOOD RIGHT HAND Performed at [facility] [address], [place], [place] [address]    Special Requests   Final    BOTTLES DRAWN AEROBIC AND ANAEROBIC Blood Culture adequate volume Performed at [facility] [address], [place], [place] [address]    Culture   Final    NO GROWTH 3 DAYS Performed at [facility], [place] [address] [place], [place] [address]    Report Status PENDING  Incomplete  MRSA PCR Screening     Status: Abnormal   Collection Time: 04/01/18  2:15 PM  Result Value Ref Range Status   MRSA by PCR POSITIVE (A) NEGATIVE Final    Comment:        The GeneXpert MRSA Assay (FDA approved for NASAL specimens only), is one  component of a comprehensive MRSA colonization surveillance program. It is not intended to diagnose MRSA infection nor to guide or monitor treatment for MRSA infections. RESULT CALLED TO, READ BACK BY AND VERIFIED WITH: Vincenza Hews [id number] @ [address] [place] Performed at [facility] [address], [place],  [address]          Radiology Studies: No results found.      Scheduled Meds: . Chlorhexidine Gluconate Cloth  6 each Topical Q0600  . dexamethasone  4 mg Oral BID WC  . fentaNYL  50 mcg Transdermal Q72H  . levofloxacin  500 mg Oral Q24H  . mupirocin ointment  1 application Nasal BID  . pantoprazole sodium  40 mg Per Tube BID  . polyethylene glycol  17 g Oral Daily  . potassium chloride  20 mEq Oral BID  . senna-docusate  1 tablet Oral BID  . tamsulosin  0.4 mg Oral Daily   Continuous Infusions: . dextrose 75 mL/hr at 04/04/18 0457     LOS: 4 days    Georgette Shell, MD [facility]  If 7PM-7AM, please contact night-coverage [email or web address] Password [facility] 04/04/2018, 10:19 AM

## 2018-04-04 NOTE — Progress Notes (Signed)
Paged MD for clarification of order without return call. Of note, pt is s/p laryngectomy 10/28/2007 per his notes in the chart.  He also has h/o TEP use - ? If concern for possible TE fistula?   MD, may wish to consider esophagram if concern for aspiration to evaluate for possible fistula.  SLP happy to see patient at bedside as well if need remains. Will f/u 7/21.   [place] MA, CCC-SLP

## 2018-04-05 LAB — BASIC METABOLIC PANEL
Anion gap: 9 (ref 5–15)
BUN: 12 mg/dL (ref 8–23)
CO2: 29 mmol/L (ref 22–32)
Calcium: 8.6 mg/dL — ABNORMAL LOW (ref 8.9–10.3)
Chloride: 97 mmol/L — ABNORMAL LOW (ref 98–111)
Creatinine, Ser: 0.64 mg/dL (ref 0.61–1.24)
GFR calc Af Amer: >60 mL/min (ref >60)
GLUCOSE: 124 mg/dL — AB (ref 70–99)
POTASSIUM: 3.7 mmol/L (ref 3.5–5.1)
Sodium: 135 mmol/L (ref 135–145)

## 2018-04-05 LAB — CULTURE, BLOOD (ROUTINE X 2)
CULTURE: NO GROWTH
Culture: NO GROWTH
Special Requests: ADEQUATE
Special Requests: ADEQUATE

## 2018-04-05 MED ORDER — POTASSIUM CHLORIDE CRYS ER 20 MEQ PO TBCR
20.0000 meq | EXTENDED_RELEASE_TABLET | Freq: Two times a day (BID) | ORAL | Status: DC
  Administered 2018-04-05 – 2018-04-09 (×3): 20 meq via ORAL
  Filled 2018-04-05 (×3): qty 1

## 2018-04-05 MED ORDER — FAMOTIDINE IN NACL 20-0.9 MG/50ML-% IV SOLN
20.0000 mg | Freq: Two times a day (BID) | INTRAVENOUS | Status: DC
  Administered 2018-04-05 – 2018-04-11 (×13): 20 mg via INTRAVENOUS
  Filled 2018-04-05 (×13): qty 50

## 2018-04-05 MED ORDER — PANTOPRAZOLE SODIUM 40 MG PO TBEC
40.0000 mg | DELAYED_RELEASE_TABLET | Freq: Two times a day (BID) | ORAL | Status: DC
  Administered 2018-04-05: 40 mg via ORAL
  Filled 2018-04-05: qty 1

## 2018-04-05 MED ORDER — LEVOFLOXACIN IN D5W 500 MG/100ML IV SOLN
500.0000 mg | Freq: Every day | INTRAVENOUS | Status: DC
  Administered 2018-04-05 – 2018-04-06 (×2): 500 mg via INTRAVENOUS
  Filled 2018-04-05 (×2): qty 100

## 2018-04-05 MED ORDER — POTASSIUM CHLORIDE 10 MEQ/100ML IV SOLN
10.0000 meq | INTRAVENOUS | Status: AC
  Administered 2018-04-05 (×2): 10 meq via INTRAVENOUS
  Filled 2018-04-05 (×2): qty 100

## 2018-04-05 MED ORDER — HYDROMORPHONE HCL 1 MG/ML IJ SOLN
1.0000 mg | INTRAMUSCULAR | Status: DC | PRN
  Administered 2018-04-05 – 2018-04-11 (×34): 1 mg via INTRAVENOUS
  Filled 2018-04-05 (×36): qty 1

## 2018-04-05 MED ORDER — DEXAMETHASONE SODIUM PHOSPHATE 4 MG/ML IJ SOLN
4.0000 mg | Freq: Two times a day (BID) | INTRAMUSCULAR | Status: DC
  Administered 2018-04-05 – 2018-04-16 (×22): 4 mg via INTRAVENOUS
  Filled 2018-04-05 (×22): qty 1

## 2018-04-05 NOTE — Progress Notes (Signed)
SLP Cancellation Note  Patient Details Name: [name] MRN: [id number] DOB: 05/12/1931   Cancelled treatment:       Reason Eval/Treat Not Completed: Other (comment). Orders received for MBS; d/w Dr. Rodena Piety who requested Barium Swallow orders to be placed. Will defer SLP eval pending results of barium swallow.   Deneise Lever, [place], [place] Speech-Language Pathologist [phone]   Aliene Altes 04/05/2018, 11:20 AM

## 2018-04-05 NOTE — Evaluation (Signed)
Occupational Therapy Evaluation Patient Details Name: [name] MRN: [id number] DOB: [birth year] Today's Date: 04/05/2018    History of Present Illness [name] is an 84 y.o. male past medical history of laryngeal carcinoma status post tracheostomy, prostate cancer MRI  02/26/2018 showed metastatic disease to L3 vertebrae by tumor.  PET scan obtained on 03/17/2018 shows mass of  left lower lobe, with multiple metastatic lymphadenopathy bilaterally in the mediastinum ,spine, sternum and pelvis.  Brought to ED 7/15 for progressive weakness, shortness of breath and increased secretion through his tracheal tube.   Clinical Impression   Pt was admitted for the above.  At baseline, he is active and independent with adls.  Pt was limited by pain and he needs mod +2 for mobility.  He needs mod to max +2 for adls due to pain and weakness. Will follow in acute setting with the goals listed below    Follow Up Recommendations  SNF    Equipment Recommendations  3 in 1 bedside commode    Recommendations for Other Services       Precautions / Restrictions Precautions Precautions: Fall Precaution Comments: trach collar O2 Restrictions Weight Bearing Restrictions: No      Mobility Bed Mobility           Sit to supine: Mod assist;HOB elevated;+2 for physical assistance   General bed mobility comments: HOB raised and completed turn to EOB to decrease back pain  Transfers   Equipment used: Rolling walker (2 wheeled)   Sit to Stand: Mod assist;+2 safety/equipment              Balance             Standing balance-Leahy Scale: Poor Standing balance comment: fatiques quickly                           ADL either performed or assessed with clinical judgement   ADL Overall ADL's : Needs assistance/impaired     Grooming: Minimal assistance;Sitting           Upper Body Dressing : Moderate assistance;Sitting       Toilet Transfer: Moderate  assistance;+2 for safety/equipment;BSC;Squat-pivot;Stand-pivot   Toileting- Clothing Manipulation and Hygiene: Total assistance;+2 for safety/equipment;Sit to/from stand         General ADL Comments: Performed squat pivot to [facility] and SPT to chair after this.  Wife initially said pt was not using RW and pt initiated movement.  Pt did better with RW.  Pain limits ADLs:  mod A for UB, max +2 for LB     Vision         Perception     Praxis      Pertinent Vitals/Pain Pain Score: 10-Worst pain ever Pain Location: back Pain Descriptors / Indicators: Aching Pain Intervention(s): Limited activity within patient's tolerance;Monitored during session;Premedicated before session;Repositioned;Patient requesting pain meds-RN notified     Hand Dominance     Extremity/Trunk Assessment Upper Extremity Assessment Upper Extremity Assessment: Generalized weakness           Communication Communication Communication: Tracheostomy(covers to speak)   Cognition Arousal/Alertness: Awake/alert Behavior During Therapy: WFL for tasks assessed/performed Overall Cognitive Status: Within Functional Limits for tasks assessed                                     General Comments       Exercises  Shoulder Instructions      Home Living Family/patient expects to be discharged to:: Skilled nursing facility                                        Prior Functioning/Environment          Comments: was mowing 2 weeks PTA        OT Problem List: Decreased strength;Decreased activity tolerance;Impaired balance (sitting and/or standing);Decreased knowledge of use of DME or AE;Pain      OT Treatment/Interventions: Self-care/ADL training;Therapeutic activities;Patient/family education;Balance training;DME and/or AE instruction    OT Goals(Current goals can be found in the care plan section) Acute Rehab OT Goals Patient Stated Goal: none stated.  Pt needed to use  toilet and agreeable for up to chair OT Goal Formulation: With patient Time For Goal Achievement: 04/19/18 Potential to Achieve Goals: Fair ADL Goals Pt Will Transfer to Toilet: with min assist;bedside commode;stand pivot transfer Additional ADL Goal #1: pt will perform bed mobility with min A in preparation for adls Additional ADL Goal #2: pt will tolerate 10 minutes of light adl or strengthening activity  OT Frequency: Min 2X/week   Barriers to D/C:            Co-evaluation              AM-PAC PT "6 Clicks" Daily Activity     Outcome Measure Help from another person eating meals?: A Little Help from another person taking care of personal grooming?: A Little Help from another person toileting, which includes using toliet, bedpan, or urinal?: A Lot Help from another person bathing (including washing, rinsing, drying)?: A Lot Help from another person to put on and taking off regular upper body clothing?: A Lot Help from another person to put on and taking off regular lower body clothing?: A Lot 6 Click Score: 14   End of Session    Activity Tolerance: Patient limited by pain Patient left: in chair;with call bell/phone within reach;with chair alarm set;with family/visitor present  OT Visit Diagnosis: Muscle weakness (generalized) (M62.81);Unsteadiness on feet (R26.81)                Time: 9030-0923 OT Time Calculation (min): 22 min Charges:  OT General Charges $OT Visit: 1 Visit OT Evaluation $OT Eval Low Complexity: 1 Low G-Codes:     [place], OTR/L [phone] 04/05/2018  [name] 04/05/2018, 11:17 AM

## 2018-04-05 NOTE — Progress Notes (Signed)
PROGRESS NOTE    [name]  QMG:[id number] DOB: 10-18-1930 DOA: 03/30/2018 PCP: Biagio Borg, MD   Brief Narrative:  84 y.o.malepast medical history of laryngeal carcinoma status post tracheostomy, prostate cancer last PSA 0.15, for which he completed his radiation therapy in 2015 with a new lung mass, he was noted to have increased back pain and MRI of the lumbar spine on 02/26/2018 show foci metastatic disease to L3 vertebrae by tumor. PET scan obtained on 03/17/2018 show 5.4 cm mass in the posterior left lower lobe of the lung, with multiple metastatic lymphadenopathy bilaterally in the mediastinum and increase radiotracer uptake throughout the spine sternum and pelvis.Brought intothe ED for progressive weakness shortness of breath and increased secretion through his tracheal tube.CT chest showed multilobar PNA.The patient initially wanted to be a DNR buthas reversed it to full code.  From [place] he is improving, we have changed him to oral levaquin stop date 7.24.2019. He had biopsy on 7.17.2019, awaiting result. Hospice and palliative care to meet with patient once biopsy results are back    Assessment & Plan:   Principal Problem:   Acute respiratory failure with hypoxia (HCC) Active Problems:   Essential hypertension   Hypothyroidism   Metastatic cancer to bone (HCC)   Acute blood loss anemia   Acute respiratory failure (HCC)   HCAP (healthcare-associated pneumonia)   Hypernatremia Acute respiratory failure with hypoxia ([place]) due to HCAP (healthcare-associated pneumonia): Cont levaquin stopped date 7.24.2019 Cultures has remained negative. Monitor strict I & O's.  Essential hypertension:stable not on any antihypertensives.   Hypothyroidism: Continue Synthroid.  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 Narcotic regimen changed to fentanyl  and dilaudid.  DC Liq Norco as not helping with the pain patient still complaining 9 out of 10 pain.  Patient and family reports the pain has not been controlled with fentanyl or Norco.  Continue fentanyl patch and Dilaudid for now.  New lung mass metastatic lytic lesions: Oncology was consulted status post biopsy of L3 transverse process on 04/02/2018.  Acute blood loss anemia: Had some mild hemoptysis on admissionnow resolved, hbg is now stable.       DVT prophylaxis:scd Code Status:dnr Family Communication: dw wife and grand daughter Disposition Plan: Family comfortable discharging patient to a skilled nursing facility before and is controlled.  Patient currently on fentanyl patch and Dilaudid.  Seems like this is helping some Dilaudid dose was increased today.  To make sure he is on the correct dose to control pain discharge.  Patient will also be getting palliative radiation as an outpatient once discharged.  Consultants:  Oncology  Procedures: Biopsy of L3 transverse process 04/02/2018 Antimicrobials levofloxacin ending 04/08/2018  Subjective: Patient resting in bed complaining of 9 out of pain.   Objective: Vitals:   04/04/18 2039 04/04/18 2318 04/05/18 0335 04/05/18 0409  BP: 133/64   (!) 142/72  Pulse: 83   92  Resp: 18   20  Temp: 99.6 F (37.6 C)   98.8 F (37.1 C)  TempSrc: Oral   Oral  SpO2: 92% 90% 94% 98%  Weight:      Height:        Intake/Output Summary (Last 24 hours) at 04/05/2018 1046 Last data filed at 04/05/2018 0616 Gross per 24 hour  Intake 240 ml  Output 450 ml  Net -210 ml   Filed Weights   03/31/18 2155  Weight:  84.1 kg (185 lb 6.5 oz)    Examination:  General exam: Appears calm and comfortable  Respiratory system: Trach collar in place few scattered rhonchi auscultation. Respiratory effort normal. Cardiovascular system: S1 & S2 heard, RRR. No JVD, murmurs, rubs, gallops or clicks. No pedal edema. Gastrointestinal system: Abdomen is  nondistended, soft and nontender. No organomegaly or masses felt. Normal bowel sounds heard. Central nervous system: Alert and oriented. No focal neurological deficits. Extremities: Symmetric 5 x 5 power. Skin: No rashes, lesions or ulcers Psychiatry: Judgement and insight appear normal. Mood & affect appropriate.     Data Reviewed: I have personally reviewed following labs and imaging studies  CBC: Recent Labs  Lab 03/31/18 0010 03/31/18 1136 04/01/18 0422 04/02/18 0449 04/03/18 0432  WBC 17.4* 16.6* 16.7* 15.7* 13.5*  NEUTROABS 16.0* 15.4*  --   --   --   HGB 11.0* 11.0* 10.2* 9.4* 9.5*  HCT 35.1* 35.3* 32.9* 31.6* 30.6*  MCV 85.4 85.1 85.5 87.1 84.3  PLT 329 274 295 281 211   Basic Metabolic Panel: Recent Labs  Lab 04/01/18 0422 04/02/18 0449 04/03/18 0432 04/04/18 0403 04/05/18 0528  NA 149* 149* 144 146* 135  K 3.9 3.6 3.5 3.6 3.7  CL 110 109 106 105 97*  CO2 27 29 30 30 29   GLUCOSE 146* 132* 128* 115* 124*  BUN 31* 34* 25* 19 12  CREATININE 1.06 0.84 0.67 0.65 0.64  CALCIUM 9.1 9.2 8.9 9.1 8.6*   GFR: Estimated Creatinine Clearance: 67.5 mL/min (by C-G formula based on SCr of 0.64 mg/dL). Liver Function Tests: Recent Labs  Lab 03/31/18 0010  AST 52*  ALT 40  ALKPHOS 141*  BILITOT 1.0  PROT 6.2*  ALBUMIN 2.5*   No results for input(s): LIPASE, AMYLASE in the last 168 hours. No results for input(s): AMMONIA in the last 168 hours. Coagulation Profile: Recent Labs  Lab 03/31/18 1136  INR 1.40   Cardiac Enzymes: No results for input(s): CKTOTAL, CKMB, CKMBINDEX, TROPONINI in the last 168 hours. BNP (last 3 results) No results for input(s): PROBNP in the last 8760 hours. HbA1C: No results for input(s): HGBA1C in the last 72 hours. CBG: No results for input(s): GLUCAP in the last 168 hours. Lipid Profile: No results for input(s): CHOL, HDL, LDLCALC, TRIG, CHOLHDL, LDLDIRECT in the last 72 hours. Thyroid Function Tests: No results for input(s):  TSH, T4TOTAL, FREET4, T3FREE, THYROIDAB in the last 72 hours. Anemia Panel: No results for input(s): VITAMINB12, FOLATE, FERRITIN, TIBC, IRON, RETICCTPCT in the last 72 hours. Sepsis Labs: Recent Labs  Lab 03/31/18 1136 03/31/18 1137 03/31/18 1424  PROCALCITON 7.40  --   --   LATICACIDVEN  --  1.3 1.5    Recent Results (from the past 240 hour(s))  Culture, blood (routine x 2) Call MD if unable to obtain prior to antibiotics being given     Status: None (Preliminary result)   Collection Time: 03/31/18 11:36 AM  Result Value Ref Range Status   Specimen Description   Final    BLOOD RIGHT ANTECUBITAL Performed at [facility] [address], [place], [place] [address]    Special Requests   Final    BOTTLES DRAWN AEROBIC AND ANAEROBIC Blood Culture adequate volume Performed at [facility] [address], [place], [place] [address]    Culture   Final    NO GROWTH 4 DAYS Performed at [facility], [place] [address], [place], [place] [address]  Report Status PENDING  Incomplete  Culture, blood (routine x 2) Call MD if unable to obtain prior to antibiotics being given     Status: None (Preliminary result)   Collection Time: 03/31/18 11:44 AM  Result Value Ref Range Status   Specimen Description   Final    BLOOD RIGHT HAND Performed at [facility] [address], [place], [place] [address]    Special Requests   Final    BOTTLES DRAWN AEROBIC AND ANAEROBIC Blood Culture adequate volume Performed at [facility] [address] [place], [place] [address]    Culture   Final    NO GROWTH 4 DAYS Performed at [facility], [place] [address] [place], [place] [address]    Report Status PENDING  Incomplete  MRSA PCR Screening     Status: Abnormal   Collection Time: 04/01/18  2:15 PM  Result Value Ref Range Status   MRSA by PCR POSITIVE (A) NEGATIVE Final    Comment:        The GeneXpert MRSA  Assay (FDA approved for NASAL specimens only), is one component of a comprehensive MRSA colonization surveillance program. It is not intended to diagnose MRSA infection nor to guide or monitor treatment for MRSA infections. RESULT CALLED TO, READ BACK BY AND VERIFIED WITH: Vincenza Hews [id number] @ [address] [place] Performed at [facility] [address] [place], [place] [address]          Radiology Studies: No results found.      Scheduled Meds: . Chlorhexidine Gluconate Cloth  6 each Topical Q0600  . dexamethasone  4 mg Intravenous Q12H  . fentaNYL  50 mcg Transdermal Q72H  . mupirocin ointment  1 application Nasal BID  . pantoprazole sodium  40 mg Per Tube BID  . polyethylene glycol  17 g Oral Daily  . potassium chloride  20 mEq Oral BID  . senna-docusate  1 tablet Oral BID  . tamsulosin  0.4 mg Oral Daily   Continuous Infusions: . dextrose 75 mL/hr at 04/04/18 0457  . levofloxacin (LEVAQUIN) IV       LOS: 5 days     Georgette Shell, MD [facility]  If 7PM-7AM, please contact night-coverage [email or web address] Password [facility] 04/05/2018, 10:46 AM

## 2018-04-05 NOTE — Evaluation (Signed)
Clinical/Bedside Swallow Evaluation Patient Details  Name: [name] MRN: [id number] Date of Birth: [birth year]  Today's Date: 04/05/2018 Time: SLP Start Time (ACUTE ONLY): 1645 SLP Stop Time (ACUTE ONLY): 1730 SLP Time Calculation (min) (ACUTE ONLY): 45 min  Past Medical History:  Past Medical History:  Diagnosis Date  . Cataract, immature   . Esophageal stenosis 05/2016  . Full dentures   . GERD (gastroesophageal reflux disease)   . History of cancer of larynx 2009  . History of prostate cancer 2015  . History of radiation therapy 2015   for prostate cancer  . Hypertension    states under control with meds., has been on med. x 5-6 yr.  . 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 CO2 LASER APPLICATION N/A 12/23/8117   Procedure: CO2 LASER APPLICATION;  Surgeon: Rozetta Nunnery, MD;  Location: [place];  Service: ENT;  Laterality: N/A;  . CO2 LASER APPLICATION N/A 1/47/8295   Procedure: CO2 LASER APPLICATION;  Surgeon: Rozetta Nunnery, MD;  Location: [place];  Service: ENT;  Laterality: N/A;  . DIRECT LARYNGOSCOPY  04/10/2007; 02/23/2007   with bx.  . ESOPHAGEAL DILATION  06/24/2008; 12/04/2009; 12/06/2010; 01/10/2012  . ESOPHAGEAL DILATION N/A 10/12/2013   Procedure: ESOPHAGEAL DILATION;  Surgeon: Rozetta Nunnery, MD;  Location: [place];  Service: ENT;  Laterality: N/A;  . ESOPHAGOSCOPY WITH DILITATION  11/29/2014   Procedure: ESOPHAGOSCOPY WITH DILITATION;  Surgeon: Rozetta Nunnery, MD;  Location: [place];  Service: ENT;;  . LIPOMA EXCISION     abd.   Marland Kitchen MASS EXCISION  01/10/2012   Procedure: EXCISION MASS;  Surgeon: Rozetta Nunnery, MD;  Location: [place];  Service: ENT;  Laterality: N/A;  posterior neck mass  . NECK SURGERY  01/10/2012   tumor removed from back of neck  . PLACEMENT OF TRACHEAL  ESOPHAGEAL PROTHESIS, ESOPHAGOSCOPY WITH DILATION  04/07/2008  . PLACEMENT OF TRACHEAL ESOPHAGEAL PROTHESIS, ESOPHAGOSCOPY WITH DILATION N/A 06/14/2016   Procedure: REVISION OF TRACHEAL STOMA WITH CO2 LASER, CHANGE OF TRACHEAL ESOPHAGEAL PROSTHESIS, ESOPHAGOSCOPY WITH DILATION;  Surgeon: Rozetta Nunnery, MD;  Location: [place];  Service: ENT;  Laterality: N/A;  . PROSTATE BIOPSY  04/29/2005  . TONSILLECTOMY    . TOTAL LARYNGECTOMY  10/28/2007  . TRACHEAL ESOPHAGEAL PROSTHESIS (TEP) CHANGE  12/04/2009; 12/06/2010  . TRACHEOSTOMY REVISION N/A 10/12/2013   Procedure: TRACHEOSTOMA  REVSION w/ CO2 LASER;  Surgeon: Rozetta Nunnery, MD;  Location: [place];  Service: ENT;  Laterality: N/A;  . TRACHEOSTOMY REVISION N/A 11/29/2014   Procedure: tracheal esophageal prosthesis change ;  Surgeon: Rozetta Nunnery, MD;  Location: [place];  Service: ENT;  Laterality: N/A;   HPI:  [name] an 85 y.o.malepast medical history of laryngeal carcinoma status post total laryngectomy, uses tracheoesophageal prosthesis for communication, prostate cancer last PSA 0.15, for which he completed his radiation therapy in 2015 with a new lung mass, he was noted to have increased back pain and MRI of the lumbar spine on 02/26/2018 show foci metastatic disease to L3 vertebrae by tumor. PET scan obtained on 03/17/2018 show 5.4 cm mass in the posterior left lower lobe of the lung, with multiple metastatic lymphadenopathy bilaterally in the mediastinum and increase radiotracer uptake throughout the spine sternum and pelvis.Brought intothe ED for progressive weakness shortness of breath and increased secretion  through his tracheal tube.CT chest showed multilobar PNA.   Assessment / Plan / Recommendation Clinical Impression   Patient is aspirating thin liquids due to what appears to be a TEP (tracheoesophageal prosthesis) leak. Pt removed filter/cassette from  tracheal stoma and SLP inspected TEP; bloody, granulomatous tissue is observed just inferior to TEP. SLP observed water leaking into pt's airway from region of his TEP when he took a sip of thin liquid. Unable to determine if leakage is simply due to need for prosthesis change vs possible fistula, suggest ENT consult for further assessment. [name] able to change his own TEP, however he has difficulty occasionally and is followed by SLP at [facility]. He has attempted to change this prosthesis today but unsuccessfully. Recommend pt have his TEP changed by qualified individual (perhaps ENT or SLP with specialized training) and if his difficulty with liquids persists, imaging may be appropriate for further assessment to r/o fistula. Would advise holding esophagram pending TEP change. Discussed with Dr. Rodena Piety. Will follow up.      SLP Visit Diagnosis: Dysphagia, unspecified (R13.10)    Aspiration Risk  Moderate aspiration risk    Diet Recommendation NPO   Medication Administration: Via alternative means    Other  Recommendations Recommended Consults: Consider ENT evaluation Oral Care Recommendations: Oral care BID   Follow up Recommendations Skilled Nursing facility      Frequency and Duration min 2x/week  1 week       Prognosis Prognosis for Safe Diet Advancement: Good      Swallow Study   General Date of Onset: 03/30/18 HPI: [name] an 84 y.o.malepast medical history of laryngeal carcinoma status post total laryngectomy, uses tracheoesophageal prosthesis for communication, prostate cancer last PSA 0.15, for which he completed his radiation therapy in 2015 with a new lung mass, he was noted to have increased back pain and MRI of the lumbar spine on 02/26/2018 show foci metastatic disease to L3 vertebrae by tumor. PET scan obtained on 03/17/2018 show 5.4 cm mass in the posterior left lower lobe of the lung, with multiple metastatic lymphadenopathy bilaterally in the mediastinum and  increase radiotracer uptake throughout the spine sternum and pelvis.Brought intothe ED for progressive weakness shortness of breath and increased secretion through his tracheal tube.CT chest showed multilobar PNA. Type of Study: Bedside Swallow Evaluation Previous Swallow Assessment: total laryngectomy; followed by SLP at [place] Prior to this Study: NPO Temperature Spikes Noted: No Respiratory Status: Trach Collar History of Recent Intubation: No Behavior/Cognition: Alert;Cooperative Oral Cavity Assessment: Within Functional Limits Oral Care Completed by SLP: No Vision: Functional for self-feeding Self-Feeding Abilities: Able to feed self Patient Positioning: Upright in bed Baseline Vocal Quality: Other (comment)(uses TEP) Volitional Cough: Other (Comment)(pt does not have vocal cords) Volitional Swallow: Able to elicit    Oral/Motor/Sensory Function Overall Oral Motor/Sensory Function: Within functional limits   Ice Chips     Thin Liquid Thin Liquid: Impaired Presentation: Cup;Self Fed Pharyngeal  Phase Impairments: Other (comments);Cough - Immediate(water leaks into trachea from area surrounding TEP)    Nectar Thick Nectar Thick Liquid: Not tested   Honey Thick Honey Thick Liquid: Not tested   Puree Puree: Not tested   Solid   GO   Solid: Not tested       Deneise Lever, MS, CCC-SLP Speech-Language Pathologist [phone]  Aliene Altes 04/05/2018,5:47 PM

## 2018-04-05 NOTE — Progress Notes (Signed)
Initial Nutrition Assessment  DOCUMENTATION CODES:   Non-severe (moderate) malnutrition in context of chronic illness  INTERVENTION:   - Pending results of MBS, recommend Ensure Enlive po TID, each supplement provides 350 kcal and 20 grams of protein (pt's family uncomfortable with pt having oral nutrition supplement prior to SLP evaluation)  - Encourage adequate PO intake   NUTRITION DIAGNOSIS:   Moderate Malnutrition related to chronic illness, cancer and cancer related treatments, poor appetite(recently diagnosed lung cancer with metastases to the spine undergoing radiation) as evidenced by mild fat depletion, moderate fat depletion, mild muscle depletion, moderate muscle depletion.  GOAL:   Patient will meet greater than or equal to 90% of their needs  MONITOR:   Diet advancement, Labs, Weight trends, PO intake  REASON FOR ASSESSMENT:   Consult Assessment of nutrition requirement/status, Poor PO  ASSESSMENT:   84 year old male who presented to the ED with respiratory distress. PMH significant for hypertension, laryngeal cancer with tracheostomy, prostate cancer, and recently diagnosed lung cancer with metastasis to the spine.  7/17 - s/o CT guided biopsy of L3 lesion showing squamous cell carcinoma 7/18 - s/p radiation therapy #3 of a planned course of 10 treatments 7/19 - s/p radiation therapy #4 of a planned course of 10 treatments  Per H&P in pt's chart on 03/31/18, pt was made complete comfort measures and morphine drip started. RD wrote comfort care note dated 03/31/18. However, it appears that comfort care was reversed later that same day. Per palliative care team note on 04/04/18, pt and family have opted to proceed with immunotherapy if pt's medical status improves and to continue radiation.  Spoke with pt, wife, and granddaughter at bedside. Pt resting in recliner at time of visit.  Per pt and granddaughter, pt has not been eating consistently for over 1 months.  Pt's granddaughter reports that prior to 1 month ago, pt was independent and walking. RD attempted to obtain a detailed diet history. Per pt and granddaughter, pt eats only 1-2 times daily. Breakfast includes a sausage biscuit. Pt may have a chicken biscuit later in the day. Pt may also have very small portions of vegetables. Per pt, this has been going on for 2-3 weeks. Pt reports having Ensure oral nutrition supplements "inconsistently."  Pt's granddaughter reports that pt had a few bites of applesauce with his medications this morning but that his last "real meal" was yesterday when he had bites of banana pudding and potato soup.  Pt and family members endorse weight loss. Pt's granddaughter states that pt told her that he lost 10 lbs in 1 month. Pt states that his UBW is 200 lbs. Weight history in chart is limited prior to July 2019 as most of the weights charted appear to be states rather than measured. Unsure of accuracy. Using measured weights, pt lost 4 lbs in 1 week. Suspect significant weight loss but unable to confirm due to lack of measured weights in pt's chart. RD attempted to obtain bed weight at time of visit, but pt was in recliner and reporting 9/10 pain.  Pt's family is concerned about pt aspirating. Noted order for MBS. Pt and family agreeable to pt receiving Ensure Enlive oral nutrition supplement only after SLP evaluation. RD will follow and order when appropriate.  Meal Completion: 100% on 04/03/18 (last charted meal)  Medications reviewed and include: 40 mg Protonix BID, Miralax daily, 20 mEq KCl BID, Senokot-S BID  Labs reviewed: chloride 97 (L)  NUTRITION - FOCUSED PHYSICAL EXAM:  Most Recent Value  Orbital Region  Moderate depletion  Upper Arm Region  Mild depletion  Thoracic and Lumbar Region  Unable to assess [d/t pain]  Buccal Region  Mild depletion  Temple Region  Moderate depletion  Clavicle Bone Region  Mild depletion  Clavicle and Acromion Bone Region   Moderate depletion  Scapular Bone Region  Unable to assess  Dorsal Hand  Mild depletion  Patellar Region  Moderate depletion  Anterior Thigh Region  Moderate depletion  Posterior Calf Region  Mild depletion  Edema (RD Assessment)  Mild  Hair  Reviewed  Eyes  Reviewed  Mouth  Reviewed  Skin  Reviewed  Nails  Reviewed       Diet Order:   Diet Order           DIET SOFT Room service appropriate? Yes; Fluid consistency: Thin  Diet effective now          EDUCATION NEEDS:   Not appropriate for education at this time  Skin:  Skin Assessment: Reviewed RN Assessment  Last BM:  04/04/18 small type 5  Height:   Ht Readings from Last 1 Encounters:  03/31/18 5' 9.02" (1.753 m)    Weight:   Wt Readings from Last 1 Encounters:  03/31/18 185 lb 6.5 oz (84.1 kg)    Ideal Body Weight:  72.73 kg  BMI:  Body mass index is 27.37 kg/m.  Estimated Nutritional Needs:   Kcal:  2000-2200 kcal/day  Protein:  100-115 grams/day  Fluid:  >/= 2.0 L/day    Gaynell Face, MS, RD, LDN Pager: [phone] Weekend/After Hours: [phone]

## 2018-04-06 ENCOUNTER — Ambulatory Visit
Admission: RE | Admit: 2018-04-06 | Discharge: 2018-04-06 | Disposition: A | Discharge status: Home / Self Care | Payer: Medicare Other | Source: Ambulatory Visit | Attending: Radiation Oncology | Admitting: Radiation Oncology

## 2018-04-06 ENCOUNTER — Inpatient Hospital Stay (HOSPITAL_COMMUNITY): Payer: Medicare Other

## 2018-04-06 ENCOUNTER — Ambulatory Visit: Payer: Medicare Other | Admitting: Oncology

## 2018-04-06 DIAGNOSIS — E440 Moderate protein-calorie malnutrition: Secondary | ICD-10-CM

## 2018-04-06 LAB — CREATININE, SERUM: CREATININE: 0.62 mg/dL (ref 0.61–1.24)

## 2018-04-06 MED ORDER — FENTANYL 75 MCG/HR TD PT72
75.0000 ug | MEDICATED_PATCH | TRANSDERMAL | Status: DC
  Administered 2018-04-09: 75 ug via TRANSDERMAL
  Filled 2018-04-06: qty 1

## 2018-04-06 NOTE — Progress Notes (Addendum)
PROGRESS NOTE    [name]  HYQ:[id number] DOB: [birth year] DOA: 03/30/2018 PCP: Biagio Borg, MD  Brief [name] y.o.malepast medical history of laryngeal carcinoma status post tracheostomy, prostate cancer last PSA 0.15, for which he completed his radiation therapy in 2015 with a new lung mass, he was noted to have increased back pain and MRI of the lumbar spine on 02/26/2018 show foci metastatic disease to L3 vertebrae by tumor. PET scan obtained on 03/17/2018 show 5.4 cm mass in the posterior left lower lobe of the lung, with multiple metastatic lymphadenopathy bilaterally in the mediastinum and increase radiotracer uptake throughout the spine sternum and pelvis.Brought intothe ED for progressive weakness shortness of breath and increased secretion through his tracheal tube.CT chest showed multilobar PNA.The patient initially wanted to be a DNR buthas reversed it to full code.  From [place] he is improving, we have changed him to oral levaquin stop date 7.24.2019. He had biopsy on 7.17.2019, awaiting result. Hospice and palliative care to meet with patient once biopsy results are back     Assessment & Plan:   Principal Problem:   Acute respiratory failure with hypoxia (HCC) Active Problems:   Essential hypertension   Hypothyroidism   Metastatic cancer to bone (HCC)   Acute blood loss anemia   Acute respiratory failure (HCC)   HCAP (healthcare-associated pneumonia)   Hypernatremia   Malnutrition of moderate degree  Acute respiratory failure with hypoxia ([place]) due to HCAP (healthcare-associated pneumonia): Cont levaquin stopped date 7.24.2019 Cultures has remained negative. Repeat chest x-ray today. Had discussions with ENT Dr. Lucia Gaskins, speech therapist Tammy.  Patient is having aspiration and leakage around the tube as he is trying to drink thin liquids.  Patient to be kept n.p.o. will hold off on placing a feeding tube until patient seen by Dr. Lucia Gaskins and  replaced with TEP.   Essential hypertension:stable not on any antihypertensives.   Hypothyroidism: Continue Synthroid.  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 Narcotic regimen changed to fentanyl and dilaudid.  DC Liq Norco as not helping with the pain patient still complaining 9 out of 10 pain.Patient and family reports the pain has not been controlled with fentanyl or Norco.  Continue fentanyl patch and Dilaudid for now.  New lung mass metastatic lytic lesions: Oncology was consulted status post biopsy of L3 transverse process on 04/02/2018.  Acute blood loss anemia: Had some mild hemoptysis on admissionnow resolved, hbg is now stable.  Pain control patient complains of 10 out of 10 pain in spite of Duragesic patch to 50 MCG and along with Dilaudid 1 mg every 3  Will increase his patch to 75 and continue Dilaudid every 3 as we are doing now.       DVT prophylaxis:  Code Status: DO NOT RESUSCITATE Family Communication: Daughter and wife Disposition Plan: Patient not ready for discharge yet will need to finalize and replaced this TEP and get his pain under control prior to discharge. Consultants: Oncology Palliative care, ENT  Procedures: Biopsy of L3 transverse process 04/02/2018 Antimicrobials levofloxacin  Subjective: Resting in bed appears in no acute distress but he complains of 10 out of 10 pain family was at bedside his stoma is open with the TEP ON THE the table.  Objective: Vitals:   04/06/18 0339 04/06/18 0615 04/06/18 0820 04/06/18 1151  BP:  120/78    Pulse: 86 92 83 85  Resp: 18 13  18 18  Temp:  99.6 F (37.6 C)    TempSrc:  Oral    SpO2: 92% 96% 93% 94%  Weight:      Height:        Intake/Output Summary (Last 24 hours) at 04/06/2018 1155 Last data filed at 04/06/2018 0517 Gross per 24 hour  Intake 1437.5 ml  Output 800 ml  Net  637.5 ml   Filed Weights   03/31/18 2155  Weight: 84.1 kg (185 lb 6.5 oz)    Examination:  General exam: Appears calm and comfortable  Respiratory system: Coarse breath sounds to auscultation. Respiratory effort normal. Cardiovascular system: S1 & S2 heard, RRR. No JVD, murmurs, rubs, gallops or clicks. No pedal edema. Gastrointestinal system: Abdomen is nondistended, soft and nontender. No organomegaly or masses felt. Normal bowel sounds heard. Central nervous system: Alert and oriented. No focal neurological deficits. Extremities: Symmetric 5 x 5 power. Skin: No rashes, lesions or ulcers Psychiatry: Judgement and insight appear normal. Mood & affect appropriate.     Data Reviewed: I have personally reviewed following labs and imaging studies  CBC: Recent Labs  Lab 03/31/18 0010 03/31/18 1136 04/01/18 0422 04/02/18 0449 04/03/18 0432  WBC 17.4* 16.6* 16.7* 15.7* 13.5*  NEUTROABS 16.0* 15.4*  --   --   --   HGB 11.0* 11.0* 10.2* 9.4* 9.5*  HCT 35.1* 35.3* 32.9* 31.6* 30.6*  MCV 85.4 85.1 85.5 87.1 84.3  PLT 329 274 295 281 481   Basic Metabolic Panel: Recent Labs  Lab 04/01/18 0422 04/02/18 0449 04/03/18 0432 04/04/18 0403 04/05/18 0528 04/06/18 0642  NA 149* 149* 144 146* 135  --   K 3.9 3.6 3.5 3.6 3.7  --   CL 110 109 106 105 97*  --   CO2 27 29 30 30 29   --   GLUCOSE 146* 132* 128* 115* 124*  --   BUN 31* 34* 25* 19 12  --   CREATININE 1.06 0.84 0.67 0.65 0.64 0.62  CALCIUM 9.1 9.2 8.9 9.1 8.6*  --    GFR: Estimated Creatinine Clearance: 67.5 mL/min (by C-G formula based on SCr of 0.62 mg/dL). Liver Function Tests: Recent Labs  Lab 03/31/18 0010  AST 52*  ALT 40  ALKPHOS 141*  BILITOT 1.0  PROT 6.2*  ALBUMIN 2.5*   No results for input(s): LIPASE, AMYLASE in the last 168 hours. No results for input(s): AMMONIA in the last 168 hours. Coagulation Profile: Recent Labs  Lab 03/31/18 1136  INR 1.40   Cardiac Enzymes: No results for input(s):  CKTOTAL, CKMB, CKMBINDEX, TROPONINI in the last 168 hours. BNP (last 3 results) No results for input(s): PROBNP in the last 8760 hours. HbA1C: No results for input(s): HGBA1C in the last 72 hours. CBG: No results for input(s): GLUCAP in the last 168 hours. Lipid Profile: No results for input(s): CHOL, HDL, LDLCALC, TRIG, CHOLHDL, LDLDIRECT in the last 72 hours. Thyroid Function Tests: No results for input(s): TSH, T4TOTAL, FREET4, T3FREE, THYROIDAB in the last 72 hours. Anemia Panel: No results for input(s): VITAMINB12, FOLATE, FERRITIN, TIBC, IRON, RETICCTPCT in the last 72 hours. Sepsis Labs: Recent Labs  Lab 03/31/18 1136 03/31/18 1137 03/31/18 1424  PROCALCITON 7.40  --   --   LATICACIDVEN  --  1.3 1.5    Recent Results (from the past 240 hour(s))  Culture, blood (routine x 2) Call MD if unable to obtain prior to antibiotics being given     Status: None   Collection Time: 03/31/18  11:36 AM  Result Value Ref Range Status   Specimen Description   Final    BLOOD RIGHT ANTECUBITAL Performed at [facility] [address], [place], [place] [address]    Special Requests   Final    BOTTLES DRAWN AEROBIC AND ANAEROBIC Blood Culture adequate volume Performed at [facility] [address] [place], [place] [address]    Culture   Final    NO GROWTH 5 DAYS Performed at [facility], [place] [address] [place], [place] [address]    Report Status 04/05/2018 FINAL  Final  Culture, blood (routine x 2) Call MD if unable to obtain prior to antibiotics being given     Status: None   Collection Time: 03/31/18 11:44 AM  Result Value Ref Range Status   Specimen Description   Final    BLOOD RIGHT HAND Performed at [facility] [address], [place], [place] [address]    Special Requests   Final    BOTTLES DRAWN AEROBIC AND ANAEROBIC Blood Culture adequate volume Performed at [facility]  [address] [place], [place] [address]    Culture   Final    NO GROWTH 5 DAYS Performed at [facility], [place] [address], [place], [place] [address]    Report Status 04/05/2018 FINAL  Final  MRSA PCR Screening     Status: Abnormal   Collection Time: 04/01/18  2:15 PM  Result Value Ref Range Status   MRSA by PCR POSITIVE (A) NEGATIVE Final    Comment:        The GeneXpert MRSA Assay (FDA approved for NASAL specimens only), is one component of a comprehensive MRSA colonization surveillance program. It is not intended to diagnose MRSA infection nor to guide or monitor treatment for MRSA infections. RESULT CALLED TO, READ BACK BY AND VERIFIED WITH: Vincenza Hews [id number] @ [address] [place] Performed at [facility] [address] [place],  [address]          Radiology Studies: No results found.      Scheduled Meds: . Chlorhexidine Gluconate Cloth  6 each Topical Q0600  . dexamethasone  4 mg Intravenous Q12H  . fentaNYL  50 mcg Transdermal Q72H  . mupirocin ointment  1 application Nasal BID  . polyethylene glycol  17 g Oral Daily  . potassium chloride  20 mEq Oral BID  . senna-docusate  1 tablet Oral BID  . tamsulosin  0.4 mg Oral Daily   Continuous Infusions: . dextrose 75 mL/hr at 04/06/18 0149  . famotidine (PEPCID) IV Stopped (04/05/18 2233)  . levofloxacin (LEVAQUIN) IV Stopped (04/05/18 1252)     LOS: 6 days     Georgette Shell, MD [facility]  If 7PM-7AM, please contact night-coverage [email or web address] Password [facility] 04/06/2018, 11:55 AM

## 2018-04-06 NOTE — Progress Notes (Signed)
PT Cancellation Note  Patient Details Name: [name] MRN: [id number] DOB: July 18, 1931   Cancelled Treatment:     Pt out of room for Radiation   Nathanial Rancher 04/06/2018, 2:30 PM

## 2018-04-06 NOTE — Progress Notes (Signed)
Called by RN x2 for assistance with pt while this writer was in ICU doing an emergent procedure.  AC aware and spoke to family.  Upon arrival to the floor, the RN informed that she suctioned the pt for a small amount of white secretions, O2 sats were 93%.  Pt seen, still on 35% atc, no respiratory distress noted, vitals wnl.  Pt and family asked that his stoma be cleaned out with brushes he brought from home.  This writer explained that I could suction him but could not clean it out as requested.  Pt now requests that speech therapy come to his bedside to assist.  Pt and his family were advised that speech therapy would be available later this morning.  RN will report to upcoming dayshift nurse about family's request.

## 2018-04-06 NOTE — Progress Notes (Signed)
SLP reviewed chart and noted pt with leak around TEP per SLP clinical swallow evaluation yesterday.  Would recommend pt be NPO due to aspiration and ENT referral for replacement of TEP.  SLP phoned xray and advised to hold on esophagram pending TEP change.  Thanks.  Luanna Salk, [place] [facility] SLP [phone]

## 2018-04-06 NOTE — Progress Notes (Signed)
CSW following to assist with disposition- planning to have pt admit to SNF at [facility]. Pt, wife, and daughter have bed offers- [facility] reviewing to see if tracheostomy needs can be accommodated). Both facilities able to assist with transportation to radiation appointments.  Sharren Bridge, MSW, LCSW Clinical Social Work 04/06/2018 [phone]

## 2018-04-06 NOTE — Care Management Important Message (Signed)
Important Message  Patient Details  Name: Emmitt Matthews MRN: [id number] Date of Birth: [birth year]   Medicare Important Message Given:  Yes    Kerin Salen 04/06/2018, 10:45 AMImportant Message  Patient Details  Name: Bharath Bernstein MRN: [id number] Date of Birth: 1931-03-20   Medicare Important Message Given:  Yes    Kerin Salen 04/06/2018, 10:45 AM

## 2018-04-06 NOTE — Progress Notes (Signed)
  Speech Language Pathology Treatment: Cognitive-Linquistic;Dysphagia  Patient Details Name: [name] MRN: [id number] DOB: 1931-06-18 Today's Date: 04/06/2018 Time: 1221(between 2 visits)-1235 SLP Time Calculation (min) (ACUTE ONLY): 14 min  Assessment / Plan / Recommendation Clinical Impression  SLP checked in with pt and family to obtain information re: TEP to facilitate its ordering and replacement.  Son in law reports his wife *pt's daughter* is gone to bring pt TEP replacements- later noted daughter brought 8 mm and 10 mm Provox2.  SLP called ENT office and left information re: TEPs brought in and available for placement.  Daughter Bailey Mech stated she also called ENT to inform him of TEPs in hospital.     Pt inquiring re: ability to consume thicker liquids - advised pt and son in law against pt having po due to pt aspirating around TEP.  Did recommend pt be allowed to swish and expectorate with water for comfort.    Further advised to plan for small bore feeding tube for nutrition and medication if ENT unable to replace TEP today.  SLP will follow briefly to aid in pt's communication/faciliate nutrition/airway protection.  Called later and spoke to T[place]ney [place] and she reports she will be notified if MD unable to come today.  Thank you to ENT for seeing this pt to replace his TEP.    HPI HPI: [name] an 84 y.o.malepast medical history of laryngeal carcinoma status post total laryngectomy, uses tracheoesophageal prosthesis for communication, prostate cancer last PSA 0.15, for which he completed his radiation therapy in 2015 with a new lung mass, he was noted to have increased back pain and MRI of the lumbar spine on 02/26/2018 show foci metastatic disease to L3 vertebrae by tumor. PET scan obtained on 03/17/2018 show 5.4 cm mass in the posterior left lower lobe of the lung, with multiple metastatic lymphadenopathy bilaterally in the mediastinum and increase radiotracer uptake throughout the  spine sternum and pelvis.Brought intothe ED for progressive weakness shortness of breath and increased secretion through his tracheal tube.CT chest showed multilobar PNA. Pt seen by our SLP and water was noted to spill around TEP - therefore pt is aspirating.  SLP follow up to faciliate communication and work with ENT re: TEP replacement.        SLP Plan          Recommendations  Diet recommendations: NPO Medication Administration: Via alternative means                Follow up Recommendations: (tbd) SLP Visit Diagnosis: Aphonia (R49.1);Dysphagia, unspecified (R13.10)       GO                Macario Golds 04/06/2018, 3:34 PM  Luanna Salk, [facility] SLP [phone]

## 2018-04-07 ENCOUNTER — Inpatient Hospital Stay (HOSPITAL_COMMUNITY): Payer: Medicare Other

## 2018-04-07 ENCOUNTER — Ambulatory Visit
Admission: RE | Admit: 2018-04-07 | Discharge: 2018-04-07 | Disposition: A | Discharge status: Home / Self Care | Payer: Medicare Other | Source: Ambulatory Visit | Attending: Radiation Oncology | Admitting: Radiation Oncology

## 2018-04-07 DIAGNOSIS — R1310 Dysphagia, unspecified: Secondary | ICD-10-CM

## 2018-04-07 DIAGNOSIS — E440 Moderate protein-calorie malnutrition: Secondary | ICD-10-CM

## 2018-04-07 LAB — BASIC METABOLIC PANEL
ANION GAP: 10 (ref 5–15)
BUN: 11 mg/dL (ref 8–23)
CHLORIDE: 96 mmol/L — AB (ref 98–111)
CO2: 28 mmol/L (ref 22–32)
Calcium: 8.8 mg/dL — ABNORMAL LOW (ref 8.9–10.3)
Creatinine, Ser: 0.63 mg/dL (ref 0.61–1.24)
GFR calc non Af Amer: >60 mL/min (ref >60)
GLUCOSE: 145 mg/dL — AB (ref 70–99)
POTASSIUM: 3.9 mmol/L (ref 3.5–5.1)
Sodium: 134 mmol/L — ABNORMAL LOW (ref 135–145)

## 2018-04-07 LAB — CBC
HEMATOCRIT: 30.3 % — AB (ref 39.0–52.0)
HEMOGLOBIN: 9.7 g/dL — AB (ref 13.0–17.0)
MCH: 26.4 pg (ref 26.0–34.0)
MCHC: 32 g/dL (ref 30.0–36.0)
MCV: 82.6 fL (ref 78.0–100.0)
Platelets: 189 10*3/uL (ref 150–400)
RBC: 3.67 MIL/uL — ABNORMAL LOW (ref 4.22–5.81)
RDW: 14.6 % (ref 11.5–15.5)
WBC: 18.7 10*3/uL — AB (ref 4.0–10.5)

## 2018-04-07 LAB — GLUCOSE, CAPILLARY: GLUCOSE-CAPILLARY: 139 mg/dL — AB (ref 70–99)

## 2018-04-07 MED ORDER — JEVITY 1.2 CAL PO LIQD
1000.0000 mL | ORAL | Status: DC
  Administered 2018-04-07: 20 mL/h

## 2018-04-07 MED ORDER — ALBUTEROL SULFATE (2.5 MG/3ML) 0.083% IN NEBU
2.5000 mg | INHALATION_SOLUTION | RESPIRATORY_TRACT | Status: DC
  Administered 2018-04-07 – 2018-04-08 (×9): 2.5 mg via RESPIRATORY_TRACT
  Filled 2018-04-07 (×9): qty 3

## 2018-04-07 MED ORDER — ALBUTEROL SULFATE (2.5 MG/3ML) 0.083% IN NEBU
INHALATION_SOLUTION | RESPIRATORY_TRACT | Status: AC
  Filled 2018-04-07: qty 3

## 2018-04-07 MED ORDER — ACETYLCYSTEINE 20 % IN SOLN
4.0000 mL | RESPIRATORY_TRACT | Status: DC
  Administered 2018-04-07 – 2018-04-08 (×7): 4 mL via RESPIRATORY_TRACT
  Filled 2018-04-07 (×9): qty 4

## 2018-04-07 MED ORDER — SODIUM CHLORIDE 0.9 % IV SOLN
3.0000 g | Freq: Four times a day (QID) | INTRAVENOUS | Status: DC
  Administered 2018-04-07 – 2018-04-11 (×16): 3 g via INTRAVENOUS
  Filled 2018-04-07 (×19): qty 3

## 2018-04-07 MED ORDER — ACETYLCYSTEINE 10 % IN SOLN
4.0000 mL | RESPIRATORY_TRACT | Status: DC
  Filled 2018-04-07 (×3): qty 4

## 2018-04-07 NOTE — Progress Notes (Signed)
Daily Progress Note   Patient Name: [name]       Date: 04/07/2018 DOB: 11-08-1930  Age: 84 y.o. MRN#: [id number] Attending Physician: Georgette Shell, MD Primary Care Physician: Biagio Borg, MD Admit Date: 03/30/2018  Reason for Consultation/Follow-up: Establishing goals of care  Subjective: Patient laying in bed visiting with family. He denies any current or discomfort. He is able to communicate by mouthing words and writing. Family at bedside (wife, daughter, and son-in-law). He is a little upset that he is not being allowed to eat at this time. We discussed the safety of him not eating at this time due to the leakage around his TEP. He verbalized understanding. He is aware that staff will be placing a feeding tube today after radiation to allow for nutrition and medication administration. Daughter reports Dr. Lucia Gaskins is planning to replace TEP tomorrow either at bedside or via surgical intervention. They have brought replacements from home. Family verbalizes awareness and agreement with plans. During conversation, patient communicated via writing inquiring about his death certificate and living will. Assessed patient and he is alert and oriented x3. When asked why he was inquiring he communicated that in case anything happened during procedure! Patient advised a death certificate was only completed once a person passed away. After further discussion, patient was attempting to notify daughter of his financial documents and will. Patient advise hopefully there would be no complications with procedure and he smiled and agreed and communicated "you never know". Patient again asked if he was ok with planned feeding tube and replacement of TEP and he communicated he was fine with the plan and  wanted to proceed as scheduled.   Chart reviewed and report received by bedside RN.   Length of Stay: 7  Current Medications: Scheduled Meds:  . acetylcysteine  4 mL Nebulization Q4H  . albuterol  2.5 mg Nebulization Q4H  . Chlorhexidine Gluconate Cloth  6 each Topical Q0600  . dexamethasone  4 mg Intravenous Q12H  . [START ON 04/09/2018] fentaNYL  75 mcg Transdermal Q72H  . mupirocin ointment  1 application Nasal BID  . polyethylene glycol  17 g Oral Daily  . potassium chloride  20 mEq Oral BID  . senna-docusate  1 tablet Oral BID  . tamsulosin  0.4 mg Oral Daily    Continuous  Infusions: . ampicillin-sulbactam (UNASYN) IV 3 g (04/07/18 1211)  . dextrose 75 mL/hr at 04/07/18 0437  . famotidine (PEPCID) IV 20 mg (04/07/18 1053)    PRN Meds: acetaminophen, acetaminophen, HYDROmorphone (DILAUDID) injection, LORazepam, ondansetron **OR** ondansetron (ZOFRAN) IV  Physical Exam  Constitutional: Vital signs are normal. He appears cachectic. He is cooperative. He has a sickly appearance.  Cardiovascular: Regular rhythm, normal heart sounds and normal pulses.  Pulmonary/Chest: Effort normal. He has decreased breath sounds.  TEP in place, trach collar   Abdominal: Soft.  Neurological: He is alert.  Psychiatric: Judgment normal. Cognition and memory are normal.  Nursing note and vitals reviewed.  Vital Signs: BP (!) 164/75 (BP Location: Right Arm)   Pulse 82   Temp 100.2 F (37.9 C) (Oral)   Resp 16   Ht 5' 9.02" (1.753 m)   Wt 84.1 kg (185 lb 6.5 oz)   SpO2 92%   BMI 27.37 kg/m  SpO2: SpO2: 92 % O2 Device: O2 Device: Tracheostomy Collar O2 Flow Rate: O2 Flow Rate (L/min): 10 L/min  Intake/output summary:   Intake/Output Summary (Last 24 hours) at 04/07/2018 1540 Last data filed at 04/07/2018 1013 Gross per 24 hour  Intake -  Output 1350 ml  Net -1350 ml   LBM: Last BM Date: 04/05/18 Baseline Weight: Weight: 84.1 kg (185 lb 6.5 oz) Most recent weight: Weight: 84.1 kg  (185 lb 6.5 oz)      Palliative Assessment/Data: PPS 40%   Flowsheet Rows     Most Recent Value  Intake Tab  Referral Department  Hospitalist  Unit at Time of Referral  Oncology Unit  Palliative Care Primary Diagnosis  Cancer  Date Notified  04/02/18  Palliative Care Type  New Palliative care  Reason for referral  End of Life Care Assistance  Date of Admission  03/30/18  Date first seen by Palliative Care  04/03/18  # of days Palliative referral response time  1 Day(s)  # of days IP prior to Palliative referral  3  Clinical Assessment  Psychosocial & Spiritual Assessment  Palliative Care Outcomes      Patient Active Problem List   Diagnosis Date Noted  . Malnutrition of moderate degree 04/06/2018  . Hypernatremia 04/01/2018  . Acute respiratory failure with hypoxia ([place]) 03/31/2018  . Acute GI bleeding 03/31/2018  . Acute blood loss anemia 03/31/2018  . Acute respiratory failure ([place]) 03/31/2018  . HCAP (healthcare-associated pneumonia) 03/31/2018  . Metastatic cancer to bone ([place]) 03/10/2018  . Left lumbar radiculopathy 02/12/2018  . Hypothyroidism 08/05/2017  . Abnormal TSH 01/29/2017  . Dizziness 04/12/2016  . Back pain 01/20/2015  . Insomnia 01/20/2015  . Malignant neoplasm of prostate ([place]) 02/22/2014  . Hematochezia 12/10/2011  . Abdominal pain, other specified site 12/10/2011  . Laryngeal cancer ([place]) 11/10/2011  . Diarrhea 11/10/2011  . Impaired glucose tolerance   . Preventative health care 11/03/2011  . Hyperlipidemia 08/29/2008  . ANXIETY 08/29/2008  . Essential hypertension 08/29/2008  . ALLERGIC RHINITIS 08/29/2008  . DIVERTICULOSIS, COLON 08/29/2008  . GERD 08/20/2007  . BENIGN PROSTATIC HYPERTROPHY 08/20/2007    Palliative Care Assessment & Plan   Patient Profile: 83 y.o. male admitted on 03/30/2018 from home with complaints of shortness of breath with increased tracheal tube secretions and weakness with inability to walk. Patient has a  significant medical history for laryngeal carcinoma status post tracheostomy (2009), hypertension, prostate cancer (2015) s/p radiation, GERD, back pain, and most recently diagnosed with left lower lobe lung cancer  with metastatic lymphadenopathy and L3 vertebral body. He was brought to ER by his family (wife and daughter). Patient was recently placed on steroids but reports his appetite has been poor over the past few days prior to admission. He denied chest pain or abdominal pain. During ER course he was debrile, tachycardic, and hypoxic and placed on 100% non-rebreather.  CT angiogram of the chest shows bilateral consolidation. Following the CT scan patient had increased secretions coming out of the tracheal stoma and NG tube placed shows frank blood aspirated. Patient became more hypoxic desaturating on 100% nonrebreather. Daughter did not want patient intubated. Since admission patient is maintaining saturations with trach collar, he has been seen by Dr. 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 Patient and family has opted to proceed with immunotherapy if his medical status improves and to continue wth radiation. Palliative Medicine consulted for goals of care discussion.   Recommendations/Plan:  DNR/DNI-at patient and family's request.  Continue to treat the treatable while hospitalized.  Patient and family is hopeful he may receive some form of rehabilitation after discharge to assist with regaining strength and increase in functional status.  They are hopeful to be able to meet expectations to begin immunotherapy as explained by Dr. Alen Blew to continue with radiation therapy.  CSW consult for outpatient palliative services.  Family states that their goal is to go to SNF for rehab.  Appreciate SLP evaluation and recommendations. Per daughter patient is scheduled to have TEP replaced tomorrow 7/24  by Dr. Lucia Gaskins. Needed replacements (8 & 16mm at bedside). Patient is going to have feeding tube placed to provide nutritional support and medication administration.   Patient is on fentanyl patch for long-acting pain control. Dosage has been increased for better control per Dr. Rodena Piety.    Palliative team will continue to support patient, patient's family, and medical team during hospitalization.  Goals of Care and Additional Recommendations:  Limitations on Scope of Treatment: Full Scope Treatment  Code Status:    Code Status Orders  (From admission, onward)        Start     Ordered   04/03/18 1646  Do not attempt resuscitation (DNR)  Continuous    Question Answer Comment  In the event of cardiac or respiratory ARREST Do not call a "code blue"   In the event of cardiac or respiratory ARREST Do not perform Intubation, CPR, defibrillation or ACLS   In the event of cardiac or respiratory ARREST Use medication by any route, position, wound care, and other measures to relive pain and suffering. May use oxygen, suction and manual treatment of airway obstruction as needed for comfort.      04/03/18 1645    Code Status History    Date Active Date Inactive Code Status Order ID Comments User Context   03/31/2018 1113 04/03/2018 1645 Full Code [id number]  Charlynne Cousins, MD Inpatient   03/31/2018 0550 03/31/2018 1113 DNR [id number]  Rise Patience, MD ED       Prognosis:   Unable to determine guarded to poor in the setting of newly diagnosed metastatic squamous cell lung cancer with bone mets, chronic back pain, decreased mobility, tracheostomy, poor po intake, weight loss (20-25lbs past 3 months), hypertension, acute respiratory failure with hypoxia, hx of prostate ca.   Discharge Planning:  [place] for rehab with Palliative care service follow-up  Care plan was discussed with patient,  patient's family, bedside RN.   Thank you for allowing the Palliative  Medicine Team to assist in the care of this patient.   Total Time 35 min.  Prolonged Time Billed NO       Greater than 50%  of this time was spent counseling and coordinating care related to the above assessment and plan.  Alda Lea, NP-BC Palliative Medicine Team  Phone: [phone] Fax: [phone] Pager: [phone] Amion: Bjorn Pippin   Please contact Palliative Medicine Team phone at [phone] for questions and concerns.

## 2018-04-07 NOTE — Progress Notes (Addendum)
Brief Nutrition Note  Consult received for enteral/tube feeding initiation and management.  Recieved page from RN via on-call pager. NGT placed earlier today; family requesting that TF be started ASAP. Of note, pt may be NPO starting at midnight for possible surgery tomorrow.  Adult Enteral Nutrition Protocol initiated. Full assessment to follow.  Admitting Dx: Weakness [R53.1] Acute respiratory failure ([place]) [J96.00]  Body mass index is 27.37 kg/m. Pt meets criteria for overweight based on current BMI.  Labs:  Recent Labs  Lab 04/04/18 0403 04/05/18 0528 04/06/18 0642 04/07/18 0454  NA 146* 135  --  134*  K 3.6 3.7  --  3.9  CL 105 97*  --  96*  CO2 30 29  --  28  BUN 19 12  --  11  CREATININE 0.65 0.64 0.62 0.63  CALCIUM 9.1 8.6*  --  8.8*  GLUCOSE 115* 124*  --  145*    Axie Hayne A. Jimmye Norman, RD, LDN, CDE Pager: [phone] After hours Pager: [phone]

## 2018-04-07 NOTE — Progress Notes (Addendum)
PROGRESS NOTE    [name]  PQZ:[id number] DOB: 06-21-1931 DOA: 03/30/2018 PCP: Biagio Borg, MD  Brief [name] y.o.malepast medical history of laryngeal carcinoma status post tracheostomy, prostate cancer last PSA 0.15, for which he completed his radiation therapy in 2015 with a new lung mass, he was noted to have increased back pain and MRI of the lumbar spine on 02/26/2018 show foci metastatic disease to L3 vertebrae by tumor. PET scan obtained on 03/17/2018 show 5.4 cm mass in the posterior left lower lobe of the lung, with multiple metastatic lymphadenopathy bilaterally in the mediastinum and increase radiotracer uptake throughout the spine sternum and pelvis.Brought intothe ED for progressive weakness shortness of breath and increased secretion through his tracheal tube.CT chest showed multilobar PNA.The patient initially wanted to be a DNR buthas reversed it to full code.  From [place] he is improving, we have changed him to oral levaquin stop date 7.24.2019. He had biopsy on 7.17.2019, awaiting result. Hospice and palliative care to meet with patient once biopsy results are back     Assessment & Plan:   Principal Problem:   Acute respiratory failure with hypoxia (HCC) Active Problems:   Essential hypertension   Hypothyroidism   Metastatic cancer to bone (HCC)   Acute blood loss anemia   Acute respiratory failure (HCC)   HCAP (healthcare-associated pneumonia)   Hypernatremia   Malnutrition of moderate degree  Acute respiratory failure with hypoxia ([place]) due to HCAP (healthcare-associated pneumonia): Cont levaquin stopped date 7.24.2019 Chest x-ray 04/06/2018 with worsening infiltrates possible aspiration.  Patient leaking TEP high risk for aspiration.  We will start the patient on Unasyn.  We will start the patient on Unasyn today.  With the worsening chest x-ray findings and increasing confusion and fever last night I have ordered Mucomyst for 3 days  hoping that we will thin the secretions for easy drainage. I have also ordered panda tube placement for feeding. Dr. Lucia Gaskins ENT supposed to see patient tomorrow 04/08/2018 for replacement of TEP.  Cultures has remained negative.  Had discussions with ENT Dr. Lucia Gaskins, speech therapist Tammy.  Patient is having aspiration and leakage around the tube as he is trying to drink thin liquids.  Patient to be kept n.p.o. Place Pandit tube for feeding will keep the patient n.p.o. past midnight for replacement of the TPA hopefully tomorrow.   Essential hypertension:stable not on any antihypertensives.   Hypothyroidism: Continue Synthroid.  Stage IV squamous cell carcinoma of the lung metastatic cancer to bone [facility]): status post biopsy on 7/17/2019reported to be squamous cell carcinoma. Narcotic regimen changed to fentanyl anddilaudid.DC Liq Norco asnot helping with the pain patient still complaining 9 out of 10 pain.Patient and family reports the pain has not been controlled with fentanyl or Norco.Continue fentanyl patch and Dilaudid for now.  New lung mass metastatic lytic lesions: Oncology was consulted status post biopsy of L3 transverse process on 04/02/2018 biopsy showed squamous cell carcinoma  Acute blood loss anemia: Had some mild hemoptysis on admissionnow resolved, hbg is now stable.  Pain control patient complains of 10 out of 10 pain in spite of Duragesic patch to 50 MCG and along with Dilaudid 1 mg every 3  Will increase his patch to 75 and continue Dilaudid every 3 as we are doing now.        DVT prophylaxis: scd Code Statusdnr Family Communication: Still with patient's daughter son-in-law and patient's wife.  Family is very involved. Disposition Plan: TBD  Consultants:  Oncology  Procedures biopsy of  the L3 04/02/2018 Antimicrobials: Levofloxacin ending 04/08/2018 Unasyn started 04/07/2018 for aspiration pneumonia  Subjective:   Objective: Vitals:    04/06/18 2214 04/07/18 0538 04/07/18 0838 04/07/18 1140  BP: (!) 175/82 (!) 164/75    Pulse: (!) 101 (!) 105 (!) 107 82  Resp:      Temp: 99.1 F (37.3 C) 100.2 F (37.9 C)    TempSrc: Oral Oral    SpO2: 97% 97% 90% 92%  Weight:      Height:        Intake/Output Summary (Last 24 hours) at 04/07/2018 1524 Last data filed at 04/07/2018 1013 Gross per 24 hour  Intake -  Output 1350 ml  Net -1350 ml   Filed Weights   03/31/18 2155  Weight: 84.1 kg (185 lb 6.5 oz)    Examination:  General exam: Appears calm and comfortable  Respiratory system: Clear to auscultation. Respiratory effort normal. Cardiovascular system: S1 & S2 heard, RRR. No JVD, murmurs, rubs, gallops or clicks. No pedal edema. Gastrointestinal system: Abdomen is nondistended, soft and nontender. No organomegaly or masses felt. Normal bowel sounds heard. Central nervous system: Alert and oriented. No focal neurological deficits. Extremities: Symmetric 5 x 5 power. Skin: No rashes, lesions or ulcers Psychiatry: Judgement and insight appear normal. Mood & affect appropriate.     Data Reviewed: I have personally reviewed following labs and imaging studies  CBC: Recent Labs  Lab 04/01/18 0422 04/02/18 0449 04/03/18 0432 04/07/18 0454  WBC 16.7* 15.7* 13.5* 18.7*  HGB 10.2* 9.4* 9.5* 9.7*  HCT 32.9* 31.6* 30.6* 30.3*  MCV 85.5 87.1 84.3 82.6  PLT 295 281 200 259   Basic Metabolic Panel: Recent Labs  Lab 04/02/18 0449 04/03/18 0432 04/04/18 0403 04/05/18 0528 04/06/18 0642 04/07/18 0454  NA 149* 144 146* 135  --  134*  K 3.6 3.5 3.6 3.7  --  3.9  CL 109 106 105 97*  --  96*  CO2 29 30 30 29   --  28  GLUCOSE 132* 128* 115* 124*  --  145*  BUN 34* 25* 19 12  --  11  CREATININE 0.84 0.67 0.65 0.64 0.62 0.63  CALCIUM 9.2 8.9 9.1 8.6*  --  8.8*   GFR: Estimated Creatinine Clearance: 67.5 mL/min (by C-G formula based on SCr of 0.63 mg/dL). Liver Function Tests: No results for input(s): AST, ALT,  ALKPHOS, BILITOT, PROT, ALBUMIN in the last 168 hours. No results for input(s): LIPASE, AMYLASE in the last 168 hours. No results for input(s): AMMONIA in the last 168 hours. Coagulation Profile: No results for input(s): INR, PROTIME in the last 168 hours. Cardiac Enzymes: No results for input(s): CKTOTAL, CKMB, CKMBINDEX, TROPONINI in the last 168 hours. BNP (last 3 results) No results for input(s): PROBNP in the last 8760 hours. HbA1C: No results for input(s): HGBA1C in the last 72 hours. CBG: No results for input(s): GLUCAP in the last 168 hours. Lipid Profile: No results for input(s): CHOL, HDL, LDLCALC, TRIG, CHOLHDL, LDLDIRECT in the last 72 hours. Thyroid Function Tests: No results for input(s): TSH, T4TOTAL, FREET4, T3FREE, THYROIDAB in the last 72 hours. Anemia Panel: No results for input(s): VITAMINB12, FOLATE, FERRITIN, TIBC, IRON, RETICCTPCT in the last 72 hours. Sepsis Labs: No results for input(s): PROCALCITON, LATICACIDVEN in the last 168 hours.  Recent Results (from the past 240 hour(s))  Culture, blood (routine x 2) Call MD if unable to obtain prior to antibiotics being given     Status: None   Collection  Time: 03/31/18 11:36 AM  Result Value Ref Range Status   Specimen Description   Final    BLOOD RIGHT ANTECUBITAL Performed at [facility] [address], [place], [place] [address]    Special Requests   Final    BOTTLES DRAWN AEROBIC AND ANAEROBIC Blood Culture adequate volume Performed at [facility] [address] [place], [place] [address]    Culture   Final    NO GROWTH 5 DAYS Performed at [facility], [place] [address] [place], [place] [address]    Report Status 04/05/2018 FINAL  Final  Culture, blood (routine x 2) Call MD if unable to obtain prior to antibiotics being given     Status: None   Collection Time: 03/31/18 11:44 AM  Result Value Ref Range Status   Specimen Description   Final    BLOOD RIGHT  HAND Performed at [facility] [address] [place], [place] [address]    Special Requests   Final    BOTTLES DRAWN AEROBIC AND ANAEROBIC Blood Culture adequate volume Performed at [facility] [address], [place], [place] [address]    Culture   Final    NO GROWTH 5 DAYS Performed at [facility], [place] [address] [place], [place] [address]    Report Status 04/05/2018 FINAL  Final  MRSA PCR Screening     Status: Abnormal   Collection Time: 04/01/18  2:15 PM  Result Value Ref Range Status   MRSA by PCR POSITIVE (A) NEGATIVE Final    Comment:        The GeneXpert MRSA Assay (FDA approved for NASAL specimens only), is one component of a comprehensive MRSA colonization surveillance program. It is not intended to diagnose MRSA infection nor to guide or monitor treatment for MRSA infections. RESULT CALLED TO, READ BACK BY AND VERIFIED WITH: Vincenza Hews [id number] @ [id number] Performed at [facility] [address] [place], [place] [address]          Radiology Studies: Dg Chest 1 View  Result Date: 04/06/2018 CLINICAL DATA:  Acute respiratory failure with hypoxia EXAM: CHEST  1 VIEW COMPARISON:  Chest x-ray of March 31, 2018 and chest CT scan of the same date. FINDINGS: The lungs are well-expanded. There is alveolar infiltrate inferiorly in the right upper lobe and in the left lower lobe. There is a small left pleural effusion. There is pleural based soft tissue density overlying the lateral aspect of the left seventh and eighth ribs which is not new. The heart is mildly enlarged. The pulmonary vascularity is not engorged. There is tortuosity of the descending thoracic aorta. IMPRESSION: Bilateral pneumonia. The findings in the right lung have worsened since the previous study. The findings on the left are stable to slightly less conspicuous. Electronically Signed   By: David  Martinique M.D.   On: 04/06/2018  13:25        Scheduled Meds: . acetylcysteine  4 mL Nebulization Q4H  . albuterol  2.5 mg Nebulization Q4H  . Chlorhexidine Gluconate Cloth  6 each Topical Q0600  . dexamethasone  4 mg Intravenous Q12H  . [START ON 04/09/2018] fentaNYL  75 mcg Transdermal Q72H  . mupirocin ointment  1 application Nasal BID  . polyethylene glycol  17 g Oral Daily  . potassium chloride  20 mEq Oral BID  . senna-docusate  1 tablet Oral BID  . tamsulosin  0.4 mg  Oral Daily   Continuous Infusions: . ampicillin-sulbactam (UNASYN) IV 3 g (04/07/18 1211)  . dextrose 75 mL/hr at 04/07/18 0437  . famotidine (PEPCID) IV 20 mg (04/07/18 1053)     LOS: 7 days     Georgette Shell, MD [facility] If 7PM-7AM, please contact night-coverage [email or web address] Password [facility] 04/07/2018, 3:24 PM

## 2018-04-07 NOTE — Progress Notes (Signed)
Increased jevity tube feed as ordered to 50ml/hr.  9:50 PM 04/07/2018 Nedra Hai, RN

## 2018-04-07 NOTE — Progress Notes (Signed)
Pharmacy Antibiotic Note  [name] is a 84 y.o. male admitted on 03/30/2018 with aspiration PNA.  Pharmacy has been consulted for Unasyn dosing. Patient is Day 8 total Abxs - started on Vanc/Zosyn switched to Levaquin now to change to Unasyn  Plan: Unasyn 3g IV q6 for CrCl > 30 ml/min  Height: 5' 9.02" (175.3 cm) Weight: 185 lb 6.5 oz (84.1 kg) IBW/kg (Calculated) : 70.74  Temp (24hrs), Avg:99.4 F (37.4 C), Min:98.9 F (37.2 C), Max:100.2 F (37.9 C)  Recent Labs  Lab 03/31/18 1136 03/31/18 1137 03/31/18 1424  04/01/18 0422 04/02/18 0449 04/03/18 0432 04/04/18 0403 04/05/18 0528 04/06/18 0642 04/07/18 0454  WBC 16.6*  --   --   --  16.7* 15.7* 13.5*  --   --   --  18.7*  CREATININE  --   --   --    < > 1.06 0.84 0.67 0.65 0.64 0.62 0.63  LATICACIDVEN  --  1.3 1.5  --   --   --   --   --   --   --   --    < > = values in this interval not displayed.    Estimated Creatinine Clearance: 67.5 mL/min (by C-G formula based on SCr of 0.63 mg/dL).    Allergies  Allergen Reactions  . Terazosin Other (See Comments)    DIZZINESS  . Levothyroxine Other (See Comments)    Thank you for allowing pharmacy to be a part of this patient's care.   Adrian Saran, PharmD, BCPS Pager [phone] 04/07/2018 8:20 AM

## 2018-04-07 NOTE — Progress Notes (Addendum)
SlP spoke to son in law Nicole Kindred over the phone, daughter Bailey Mech and pt's wife briefly in person and to Dr Rodena Piety in person.  Note plans for short term small bore feeding tube to be placed and nutrition to be initiated today.  NPO after midnight in case pt needs emergent surgery.  Per daughter, Dr Lucia Gaskins plans to come 04/08/18 for TEP placement.    Pt at radiation tx at this time.  SLP left message with Dr Pollie Friar answering service earlier today re: feeding tube per SLP conversation with Dr Rodena Piety.  Luanna Salk, [place] [facility] SLP [phone]

## 2018-04-07 NOTE — Progress Notes (Signed)
OT Cancellation Note  Patient Details Name: [name] MRN: [id number] DOB: October 21, 1930   Cancelled Treatment:    Reason Eval/Treat Not Completed: Fatigue/lethargy limiting ability to participate.  Per RN, he has been sleepy most of the day. Will check back another day.  Abb Gobert 04/07/2018, 2:11 PM  Lesle Chris, OTR/L [phone] 04/07/2018

## 2018-04-08 ENCOUNTER — Ambulatory Visit
Admission: RE | Admit: 2018-04-08 | Discharge: 2018-04-08 | Disposition: A | Discharge status: Home / Self Care | Payer: Medicare Other | Source: Ambulatory Visit | Attending: Radiation Oncology | Admitting: Radiation Oncology

## 2018-04-08 DIAGNOSIS — E039 Hypothyroidism, unspecified: Secondary | ICD-10-CM

## 2018-04-08 DIAGNOSIS — R0902 Hypoxemia: Secondary | ICD-10-CM

## 2018-04-08 LAB — GLUCOSE, CAPILLARY
GLUCOSE-CAPILLARY: 137 mg/dL — AB (ref 70–99)
GLUCOSE-CAPILLARY: 139 mg/dL — AB (ref 70–99)
GLUCOSE-CAPILLARY: 150 mg/dL — AB (ref 70–99)
Glucose-Capillary: 148 mg/dL — ABNORMAL HIGH (ref 70–99)
Glucose-Capillary: 134 mg/dL — ABNORMAL HIGH (ref 70–99)
Glucose-Capillary: 169 mg/dL — ABNORMAL HIGH (ref 70–99)

## 2018-04-08 LAB — CREATININE, SERUM
Creatinine, Ser: 0.68 mg/dL (ref 0.61–1.24)
GFR calc non Af Amer: >60 mL/min (ref >60)

## 2018-04-08 MED ORDER — ALBUTEROL SULFATE (2.5 MG/3ML) 0.083% IN NEBU
2.5000 mg | INHALATION_SOLUTION | Freq: Four times a day (QID) | RESPIRATORY_TRACT | Status: DC
  Administered 2018-04-09 – 2018-04-10 (×6): 2.5 mg via RESPIRATORY_TRACT
  Filled 2018-04-08 (×8): qty 3

## 2018-04-08 MED ORDER — ACETYLCYSTEINE 20 % IN SOLN: 4.0000 mL | RESPIRATORY_TRACT | Status: DC | PRN

## 2018-04-08 MED ORDER — FREE WATER
100.0000 mL | Status: DC
  Administered 2018-04-08 – 2018-04-15 (×35): 100 mL

## 2018-04-08 MED ORDER — OSMOLITE 1.5 CAL PO LIQD
1000.0000 mL | ORAL | Status: DC
  Administered 2018-04-08 – 2018-04-15 (×7): 1000 mL
  Filled 2018-04-08 (×12): qty 1000

## 2018-04-08 MED ORDER — PRO-STAT SUGAR FREE PO LIQD
30.0000 mL | Freq: Two times a day (BID) | ORAL | Status: DC
  Administered 2018-04-08 – 2018-04-15 (×11): 30 mL
  Filled 2018-04-08 (×11): qty 30

## 2018-04-08 MED ORDER — ACETYLCYSTEINE 20 % IN SOLN
4.0000 mL | Freq: Four times a day (QID) | RESPIRATORY_TRACT | Status: DC | PRN
  Administered 2018-04-15 (×2): 4 mL via RESPIRATORY_TRACT
  Filled 2018-04-08 (×5): qty 4

## 2018-04-08 NOTE — Consult Note (Signed)
I was consulted on this patient concerning leakage around his TEP when swallowing liquids.  Patient has had a since laryngectomy TEP several years ago.  He is followed by speech pathology at [facility] for changing the prosthesis.  This was last changed about 3 months ago. On exam patient has a leakage around the superior aspect of the prosthesis on swallowing liquids. Patient has recently been diagnosed with lung cancer and receiving radiation therapy.  He has lost 20 to 30 pounds over the last few months. I discussed the case with his speech pathologist Helane Rima at [facility].  He felt like he might benefit from having the prosthesis changed to a new [employer] Vega 6 mm prosthesis. In the meantime would recommend feeding with NG tube and possible placement of PEG.  Radene Journey, MD

## 2018-04-08 NOTE — Progress Notes (Signed)
PT Cancellation Note  Patient Details Name: Jashan Cotten MRN: [id number] DOB: 1931-08-31   Cancelled Treatment:    Reason Eval/Treat Not Completed: Patient at procedure or test/unavailable related to the trach.   Claretha Cooper 04/08/2018, 1:45 PM Tresa Endo PT [phone]

## 2018-04-08 NOTE — Progress Notes (Signed)
Daily Progress Note   Patient Name: [name]       Date: 04/08/2018 DOB: [birth year]  Age: 84 y.o. MRN#: [id number] Attending Physician: Patrecia Pour, Christean Grief, MD Primary Care Physician: Biagio Borg, MD Admit Date: 03/30/2018  Reason for Consultation/Follow-up: Establishing goals of care  Subjective: Patient in bed asleep. Easily aroused. Wife at bedside asleep. Patient declines pain or discomfort. Feeding tube intact. Feedings currently on hold with anticipation of PEG placement on tomorrow. Spoke with daughter, [name]. Per [name], Dr. Lucia Gaskins is following up with Aaron Edelman SLP at [facility] who was managing TEP changes outpatient. Patient is requiring a different size cannula due to weight loss. Family was given option to have PEG placed for nutritional support and medication administration. Daughter, had a few questions in regards to PEG placement after researching. We briefly discussed PEG and she verbalized family is in agreement for placement. She is aware that IR will come by to see patient and discuss in more details the procedure and scheduling. She is concerned patient will miss radiation due to placement and was reassured that the team will be in communication with each other in regards to scheduling and needs. Daughter appreciative.   Chart reviewed and report received by bedside RN.   Length of Stay: 8  Current Medications: Scheduled Meds:  . acetylcysteine  4 mL Nebulization Q4H  . albuterol  2.5 mg Nebulization Q4H  . dexamethasone  4 mg Intravenous Q12H  . feeding supplement (PRO-STAT SUGAR FREE 64)  30 mL Per Tube BID  . [START ON 04/09/2018] fentaNYL  75 mcg Transdermal Q72H  . free water  100 mL Per Tube Q4H  . polyethylene glycol  17 g Oral Daily  . potassium chloride  20  mEq Oral BID  . senna-docusate  1 tablet Oral BID  . tamsulosin  0.4 mg Oral Daily    Continuous Infusions: . ampicillin-sulbactam (UNASYN) IV Stopped (04/08/18 1228)  . dextrose 75 mL/hr at 04/08/18 1252  . famotidine (PEPCID) IV Stopped (04/08/18 1119)  . feeding supplement (OSMOLITE 1.5 CAL)      PRN Meds: acetaminophen, acetaminophen, HYDROmorphone (DILAUDID) injection, LORazepam, ondansetron **OR** ondansetron (ZOFRAN) IV  Physical Exam  Constitutional: He is oriented to person, place, and time. Vital signs are normal. He appears cachectic. He is cooperative. He has a sickly  appearance.  Cardiovascular: Regular rhythm, normal heart sounds and normal pulses.  Pulmonary/Chest: Effort normal. He has decreased breath sounds.  TEP in place, trach collar   Abdominal: Soft.  Neurological: He is alert and oriented to person, place, and time.  Skin: Skin is warm and dry.  Psychiatric: Judgment normal. Cognition and memory are normal.  Nursing note and vitals reviewed.  Vital Signs: BP 111/60 (BP Location: Right Arm)   Pulse 88   Temp 99.1 F (37.3 C) (Oral)   Resp 16   Ht 5' 9.02" (1.753 m)   Wt 80.7 kg (177 lb 14.6 oz)   SpO2 90%   BMI 26.26 kg/m  SpO2: SpO2: 90 % O2 Device: O2 Device: Tracheostomy Collar O2 Flow Rate: O2 Flow Rate (L/min): 10 L/min  Intake/output summary:   Intake/Output Summary (Last 24 hours) at 04/08/2018 1529 Last data filed at 04/08/2018 1207 Gross per 24 hour  Intake 3283.42 ml  Output 1526 ml  Net 1757.42 ml   LBM: Last BM Date: 04/07/18 Baseline Weight: Weight: 84.1 kg (185 lb 6.5 oz) Most recent weight: Weight: 80.7 kg (177 lb 14.6 oz)      Palliative Assessment/Data: PPS 40%   Flowsheet Rows     Most Recent Value  Intake Tab  Referral Department  Hospitalist  Unit at Time of Referral  Oncology Unit  Palliative Care Primary Diagnosis  Cancer  Date Notified  04/02/18  Palliative Care Type  New Palliative care  Reason for referral   End of Life Care Assistance  Date of Admission  03/30/18  Date first seen by Palliative Care  04/03/18  # of days Palliative referral response time  1 Day(s)  # of days IP prior to Palliative referral  3  Clinical Assessment  Psychosocial & Spiritual Assessment  Palliative Care Outcomes      Patient Active Problem List   Diagnosis Date Noted  . Malnutrition of moderate degree 04/06/2018  . Hypernatremia 04/01/2018  . Acute respiratory failure with hypoxia ([place]) 03/31/2018  . Acute GI bleeding 03/31/2018  . Acute blood loss anemia 03/31/2018  . Acute respiratory failure ([place]) 03/31/2018  . HCAP (healthcare-associated pneumonia) 03/31/2018  . Metastatic cancer to bone ([place]) 03/10/2018  . Left lumbar radiculopathy 02/12/2018  . Hypothyroidism 08/05/2017  . Abnormal TSH 01/29/2017  . Dizziness 04/12/2016  . Back pain 01/20/2015  . Insomnia 01/20/2015  . Malignant neoplasm of prostate ([place]) 02/22/2014  . Hematochezia 12/10/2011  . Abdominal pain, other specified site 12/10/2011  . Laryngeal cancer ([place]) 11/10/2011  . Diarrhea 11/10/2011  . Impaired glucose tolerance   . Preventative health care 11/03/2011  . Hyperlipidemia 08/29/2008  . ANXIETY 08/29/2008  . Essential hypertension 08/29/2008  . ALLERGIC RHINITIS 08/29/2008  . DIVERTICULOSIS, COLON 08/29/2008  . GERD 08/20/2007  . BENIGN PROSTATIC HYPERTROPHY 08/20/2007    Palliative Care Assessment & Plan   Patient Profile: 84 y.o. male admitted on 03/30/2018 from home with complaints of shortness of breath with increased tracheal tube secretions and weakness with inability to walk. Patient has a significant medical history for laryngeal carcinoma status post tracheostomy (2009), hypertension, prostate cancer (2015) s/p radiation, GERD, back pain, and most recently diagnosed with left lower lobe lung cancer with metastatic lymphadenopathy and L3 vertebral body. He was brought to ER by his family (wife and daughter). Patient  was recently placed on steroids but reports his appetite has been poor over the past few days prior to admission. He denied chest pain or abdominal pain. During  ER course he was debrile, tachycardic, and hypoxic and placed on 100% non-rebreather.  CT angiogram of the chest shows bilateral consolidation. Following the CT scan patient had increased secretions coming out of the tracheal stoma and NG tube placed shows frank blood aspirated. Patient became more hypoxic desaturating on 100% nonrebreather. Daughter did not want patient intubated. Since admission patient is maintaining saturations with trach collar, he has been seen by Dr. 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 Patient and family has opted to proceed with immunotherapy if his medical status improves and to continue wth radiation. Palliative Medicine consulted for goals of care discussion.   Recommendations/Plan:  DNR/DNI-at patient and family's request.  Continue to treat the treatable while hospitalized. Patient and family aware of the need for PEG due to inability to replace TEP. Both patient and family are in agreement and wish to have PEG placed for nutritional and medication support.   Patient is on fentanyl patch for long-acting pain control.   CSW working with family for SNF w/rehab placement at discharge and also a facility who can transport him to radiation. Outpatient palliative to follow at discharge also.   Palliative team will continue to support patient, patient's family, and medical team during hospitalization.  Goals of Care and Additional Recommendations:  Limitations on Scope of Treatment: Full Scope Treatment  Code Status:    Code Status Orders  (From admission, onward)        Start     Ordered   04/03/18 1646  Do not attempt resuscitation (DNR)  Continuous    Question Answer Comment  In the event of  cardiac or respiratory ARREST Do not call a "code blue"   In the event of cardiac or respiratory ARREST Do not perform Intubation, CPR, defibrillation or ACLS   In the event of cardiac or respiratory ARREST Use medication by any route, position, wound care, and other measures to relive pain and suffering. May use oxygen, suction and manual treatment of airway obstruction as needed for comfort.      04/03/18 1645    Code Status History    Date Active Date Inactive Code Status Order ID Comments User Context   03/31/2018 1113 04/03/2018 1645 Full Code [id number]  Charlynne Cousins, MD Inpatient   03/31/2018 0550 03/31/2018 1113 DNR [id number]  Rise Patience, MD ED       Prognosis:   Unable to determine guarded to poor in the setting of newly diagnosed metastatic squamous cell lung cancer with bone mets, chronic back pain, decreased mobility, tracheostomy, poor po intake, weight loss (20-25lbs past 3 months), hypertension, acute respiratory failure with hypoxia, hx of prostate ca.   Discharge Planning:  [place] for rehab with Palliative care service follow-up  Care plan was discussed with patient, patient's family, bedside RN.   Thank you for allowing the Palliative Medicine Team to assist in the care of this patient.  Total Time: 25 min.  Greater than 50%  of this time was spent counseling and coordinating care related to the above assessment and plan  Alda Lea, NP-BC Palliative Medicine Team  Phone: [phone] Fax: [phone] Pager: [phone] Amion: Bjorn Pippin   Please contact Palliative Medicine Team phone at [phone] for questions and concerns.

## 2018-04-08 NOTE — Progress Notes (Addendum)
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  Patient presented to the emergency department with progressive weakness, shortness of breath and increased secretions through his tracheal tube.  CT chest showed multilobar pneumonia and patient was admitted with working diagnosis of acute respiratory failure due to healthcare associated pneumonia.  During hospital stay had  biopsy which confirmed the diagnosis of a squamous cell.  Hospitalization was complicated with aspiration pneumonia, concern of leak through the TEP placing patient on high risk for aspiration.  Patient currently on antibiotic therapy.   Subjective: Patient seen and examined, he has no complaints today.  Feeling slight better than yesterday.  Pain is well controlled.  No acute events overnight.  ENT was unable to change the TEP as issue is with diameter not with length.   Assessment & Plan: Acute respiratory failure with hypoxia due to HCAP, worsened with aspiration during hospital stay. Patient was initially treated with Levaquin, now changed to Unasyn to treat for aspiration. Patient with TEP which is presumed to be leaking increase in the risk of aspiration.  Chest x-ray done on 7/22 showed worsening infiltrates.  Patient remains afebrile.  Oxygen requirement are at baseline.  He was attempted to replace TEP however this was not done due to erroneous TEP size.  Per ENT this will not be able to be performed soon and they are recommending to place PEG tube for nutrition.  Speech therapy have discussed recommendation with family they would like to proceed with PEG tube will consult IR in a.m. for evaluation.  Continue  supportive treatment and monitor CBC.  Moderate malnutrition due dysphagia and chronic diseases Laryngeal carcinoma status post tracheostomy, currently with leaky TEP.  Unable to swallow without risk of aspiration.  See above.  Essential hypertension BP stable during hospital stay  Stage IV squamous cell carcinoma of the lung with metastasis to the bone, L3 transverse process Pain control, patient receiving radiation therapy.  Oncology recommendations appreciated.  Acute blood loss anemia Hemoglobin stable Monitor CBC in a.m.   DVT prophylaxis: SCDs Code Status: DNR Family Communication: Family at bedside Disposition Plan: SNF when medically stable  Consultants:   Oncology  Palliative care  Procedures:     Antimicrobials: Anti-infectives (From admission, onward)   Start     Dose/Rate Route Frequency Ordered Stop   04/07/18 1000  Ampicillin-Sulbactam (UNASYN) 3 g in sodium chloride 0.9 % 100 mL IVPB     3 g 200 mL/hr over 30 Minutes Intravenous Every 6 hours 04/07/18 0821     04/05/18 1200  levofloxacin (LEVAQUIN) IVPB 500 mg  Status:  Discontinued    Note to Pharmacy:  Last dose 7/24.changed to iv as patient having difficulty swallowing   500 mg 100 mL/hr over 60 Minutes Intravenous Daily 04/05/18 1046 04/07/18 0821   04/02/18 1200  levofloxacin (LEVAQUIN) 25 MG/ML solution 500 mg  Status:  Discontinued     500 mg Oral Daily 04/02/18 1017 04/02/18 1115   04/02/18 1200  levofloxacin (LEVAQUIN) tablet 500 mg  Status:  Discontinued     500 mg Oral Every 24 hours 04/02/18 1115 04/05/18 1046   03/31/18 2000  vancomycin (VANCOCIN) 1,250 mg in sodium chloride 0.9 %  250 mL IVPB  Status:  Discontinued     1,250 mg 166.7 mL/hr over 90 Minutes Intravenous Every 24 hours 03/31/18 1140 04/02/18 1013   03/31/18 1400  ceFEPIme (MAXIPIME) 1 g in sodium chloride 0.9 % 100 mL IVPB  Status:  Discontinued     1 g 200 mL/hr over 30 Minutes Intravenous Every 8 hours 03/31/18 1106 03/31/18  1257   03/31/18 1400  piperacillin-tazobactam (ZOSYN) IVPB 3.375 g  Status:  Discontinued     3.375 g 12.5 mL/hr over 240 Minutes Intravenous Every 8 hours 03/31/18 1257 04/02/18 1013   03/31/18 0200  vancomycin (VANCOCIN) IVPB 1000 mg/200 mL premix     1,000 mg 200 mL/hr over 60 Minutes Intravenous  Once 03/31/18 0157 03/31/18 0528   03/31/18 0200  piperacillin-tazobactam (ZOSYN) IVPB 3.375 g     3.375 g 12.5 mL/hr over 240 Minutes Intravenous  Once 03/31/18 0157 03/31/18 0501       Objective: Vitals:   04/08/18 0744 04/08/18 0800 04/08/18 1211 04/08/18 1409  BP:    111/60  Pulse:  85  88  Resp:  18  16  Temp:    98.5 F (36.9 C)  TempSrc:    Oral  SpO2: 95% 95% 94% 90%  Weight:      Height:        Intake/Output Summary (Last 24 hours) at 04/08/2018 1447 Last data filed at 04/08/2018 1207 Gross per 24 hour  Intake 3283.42 ml  Output 1526 ml  Net 1757.42 ml   Filed Weights   03/31/18 2155 04/08/18 0551  Weight: 84.1 kg (185 lb 6.5 oz) 80.7 kg (177 lb 14.6 oz)    Examination:  General exam: Appears calm and comfortable  HEENT: TEP in place, no surrounding erythema, NGT in place  Respiratory system: Clear to auscultation. No wheezes,crackle or rhonchi Cardiovascular system: S1 & S2 heard, RRR. No JVD, murmurs, rubs or gallops Gastrointestinal system: Abdomen is nondistended, soft and nontender. No organomegaly or masses felt. Normal bowel sounds heard. Central nervous system: Alert and oriented. No focal neurological deficits. Extremities: No pedal edema.  Skin: No rashes,  Data Reviewed: I have personally reviewed following labs and imaging studies  CBC: Recent Labs  Lab 04/02/18 0449 04/03/18 0432 04/07/18 0454  WBC 15.7* 13.5* 18.7*  HGB 9.4* 9.5* 9.7*  HCT 31.6* 30.6* 30.3*  MCV 87.1 84.3 82.6  PLT 281 200 161   Basic Metabolic Panel: Recent Labs  Lab 04/02/18 0449 04/03/18 0432 04/04/18 0403 04/05/18 0528 04/06/18 0642 04/07/18 0454  04/08/18 0355  NA 149* 144 146* 135  --  134*  --   K 3.6 3.5 3.6 3.7  --  3.9  --   CL 109 106 105 97*  --  96*  --   CO2 29 30 30 29   --  28  --   GLUCOSE 132* 128* 115* 124*  --  145*  --   BUN 34* 25* 19 12  --  11  --   CREATININE 0.84 0.67 0.65 0.64 0.62 0.63 0.68  CALCIUM 9.2 8.9 9.1 8.6*  --  8.8*  --    GFR: Estimated Creatinine Clearance: 67.5 mL/min (by C-G formula based on SCr of 0.68 mg/dL). Liver Function Tests: No results for input(s): AST, ALT, ALKPHOS, BILITOT, PROT, ALBUMIN in the last 168 hours. No results for input(s): LIPASE, AMYLASE in the last 168 hours. No results for input(s): AMMONIA in the last 168 hours. Coagulation Profile: No results for input(s): INR,  PROTIME in the last 168 hours. Cardiac Enzymes: No results for input(s): CKTOTAL, CKMB, CKMBINDEX, TROPONINI in the last 168 hours. BNP (last 3 results) No results for input(s): PROBNP in the last 8760 hours. HbA1C: No results for input(s): HGBA1C in the last 72 hours. CBG: Recent Labs  Lab 04/07/18 2046 04/08/18 0002 04/08/18 0406 04/08/18 0736 04/08/18 1155  GLUCAP 139* 139* 148* 137* 134*   Lipid Profile: No results for input(s): CHOL, HDL, LDLCALC, TRIG, CHOLHDL, LDLDIRECT in the last 72 hours. Thyroid Function Tests: No results for input(s): TSH, T4TOTAL, FREET4, T3FREE, THYROIDAB in the last 72 hours. Anemia Panel: No results for input(s): VITAMINB12, FOLATE, FERRITIN, TIBC, IRON, RETICCTPCT in the last 72 hours. Sepsis Labs: No results for input(s): PROCALCITON, LATICACIDVEN in the last 168 hours.  Recent Results (from the past 240 hour(s))  Culture, blood (routine x 2) Call MD if unable to obtain prior to antibiotics being given     Status: None   Collection Time: 03/31/18 11:36 AM  Result Value Ref Range Status   Specimen Description   Final    BLOOD RIGHT ANTECUBITAL Performed at [facility] [address] [place], [place] [address]    Special Requests    Final    BOTTLES DRAWN AEROBIC AND ANAEROBIC Blood Culture adequate volume Performed at [facility] [address] [place], [place] [address]    Culture   Final    NO GROWTH 5 DAYS Performed at [facility], [place] [address] [place], [place] [address]    Report Status 04/05/2018 FINAL  Final  Culture, blood (routine x 2) Call MD if unable to obtain prior to antibiotics being given     Status: None   Collection Time: 03/31/18 11:44 AM  Result Value Ref Range Status   Specimen Description   Final    BLOOD RIGHT HAND Performed at [facility] [address] [place], [place] [address]    Special Requests   Final    BOTTLES DRAWN AEROBIC AND ANAEROBIC Blood Culture adequate volume Performed at [facility] [address] [place], [place] [address]    Culture   Final    NO GROWTH 5 DAYS Performed at [facility], [place] [address], [place], [place] [address]    Report Status 04/05/2018 FINAL  Final  MRSA PCR Screening     Status: Abnormal   Collection Time: 04/01/18  2:15 PM  Result Value Ref Range Status   MRSA by PCR POSITIVE (A) NEGATIVE Final    Comment:        The GeneXpert MRSA Assay (FDA approved for NASAL specimens only), is one component of a comprehensive MRSA colonization surveillance program. It is not intended to diagnose MRSA infection nor to guide or monitor treatment for MRSA infections. RESULT CALLED TO, READ BACK BY AND VERIFIED WITH: Vincenza Hews [id number] @ [address] Performed at [facility] [address] [place],  [address]       Radiology Studies: Dg Abd Portable 1v  Result Date: 04/07/2018 CLINICAL DATA:  Feeding tube placement. EXAM: PORTABLE ABDOMEN - 1 VIEW COMPARISON:  03/31/2018.  CT 03/17/2018. FINDINGS: Feeding tube noted with its tip over the distal stomach. Distended loops of small and large bowel noted suggesting adynamic ileus. No  free air. Bibasilar atelectasis/infiltrates and left-sided pleural effusion. IMPRESSION: 1.  Feeding tube noted with its tip over the distal stomach. 2. Distended loops of small  and large bowel most consistent with adynamic ileus. Follow-up exam to demonstrate resolution suggested. 3. Bibasilar atelectasis/infiltrates and left-sided pleural effusion. Electronically Signed   By: Marcello Moores  Register   On: 04/07/2018 16:31     Scheduled Meds: . acetylcysteine  4 mL Nebulization Q4H  . albuterol  2.5 mg Nebulization Q4H  . dexamethasone  4 mg Intravenous Q12H  . feeding supplement (PRO-STAT SUGAR FREE 64)  30 mL Per Tube BID  . [START ON 04/09/2018] fentaNYL  75 mcg Transdermal Q72H  . free water  100 mL Per Tube Q4H  . polyethylene glycol  17 g Oral Daily  . potassium chloride  20 mEq Oral BID  . senna-docusate  1 tablet Oral BID  . tamsulosin  0.4 mg Oral Daily   Continuous Infusions: . ampicillin-sulbactam (UNASYN) IV Stopped (04/08/18 1228)  . dextrose 75 mL/hr at 04/08/18 1252  . famotidine (PEPCID) IV Stopped (04/08/18 1119)  . feeding supplement (OSMOLITE 1.5 CAL)       LOS: 8 days    Time spent: Total of 35 minutes spent with pt, greater than 50% of which was spent in discussion of  treatment, counseling and coordination of care   Chipper Oman, MD Pager: Text Page via [email or web address]   If 7PM-7AM, please contact night-coverage [email or web address] 04/08/2018, 2:47 PM   Note - This record has been created using [employer]. Chart creation errors have been sought, but may not always have been located. Such creation errors do not reflect on the standard of medical care.

## 2018-04-08 NOTE — Progress Notes (Signed)
Nutrition Follow-up  DOCUMENTATION CODES:   Non-severe (moderate) malnutrition in context of chronic illness  INTERVENTION:  - Will adjust TF regimen: 30 mL Prostat BID 100 mL free water every 4 hours with Osmolite 1.5 @ 25 mL/hr to advance by 10 mL every 8 hours to reach goal rate of Osmolite 1.5 @ 55 mL/hr.  - At goal rate, this regimen will provide 2180 kcal, 113 grams of protein, and 1606 mL free water.   Monitor magnesium, potassium, and phosphorus daily for at least 3 days, MD to replete as needed, as pt is at risk for refeeding syndrome given poor intakes/inconsistent nutrition for >/=1 month, dx of malnutrition.   NUTRITION DIAGNOSIS:   Moderate Malnutrition related to chronic illness, cancer and cancer related treatments, poor appetite(recently diagnosed lung cancer with metastases to the spine undergoing radiation) as evidenced by mild fat depletion, moderate fat depletion, mild muscle depletion, moderate muscle depletion. -ongoing  GOAL:   Patient will meet greater than or equal to 90% of their needs -unmet with current TF rate.   MONITOR:   TF tolerance, Weight trends, Labs  REASON FOR ASSESSMENT:   Consult Enteral/tube feeding initiation and management  ASSESSMENT:   84 year old male who presented to the ED with respiratory distress. PMH significant for hypertension, laryngeal cancer with tracheostomy, prostate cancer, and recently diagnosed lung cancer with metastasis to the spine.  Weight -8 lbs/3.4 kg from 7/16-7/24; will continue to monitor closely. Patient on Soft, thin liquids diet from 7/18-7/22 and per chart review, he ate 100% of breakfast on 7/19, 100% of breakfast on 7/21, and 0% of breakfast on 7/22. No other intakes documented. Patient was changed to NPO on 7/22 at 11:38 AM.   SLP recommends NPO status, nutrition via alternative meansSmall bore NGT was placed yesterday at around 4:00 PM. TF protocol was started at that time with order for slow  advancement. Jevity 1.2 started at 20 mL/hr and increased to 30 mL/hr at 9:50 PM last night. Jevity 1.2 @ 30 mL/hr provides 864 kcal, 40 grams of protein, and 581 mL free water. TF has been off since midnight incase of need for surgery today. Patient is currently out of the room for radiation. Will place order for TF as outlined above and continue to monitor for needed changes.   Per Dr. 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   Palliative Care following. Patient is DNR/DNI. Family hopeful that patient will be able to start immunotherapy as an outpatient. Patient currently receiving radiation therapy (started 7/16).   Medications reviewed; 20 mg IV Pepcid BID, 1 packet Miralax/day, 20 mEq oral KCl BID, 1 tablet BID.  Labs reviewed; CBGs: 139, 148, and 137 mg/dL today, yesterday (7/23)-- Na: 134 mmol/L, Cl: 96 mmol/L, Ca: 8.8 mg/dL.  IVF: D5 @ 75 mL/hr (306 kcal).      Diet Order:   Diet Order           Diet NPO time specified  Diet effective midnight          EDUCATION NEEDS:   Not appropriate for education at this time  Skin:  Skin Assessment: Reviewed RN Assessment  Last BM:  7/23  Height:   Ht Readings from Last 1 Encounters:  03/31/18 5' 9.02" (1.753 m)    Weight:  Wt Readings from Last 1 Encounters:  04/08/18 177 lb 14.6 oz (80.7 kg)    Ideal Body Weight:  72.73 kg  BMI:  Body mass index is 26.26 kg/m.  Estimated Nutritional Needs:   Kcal:  2000-2200 kcal/day  Protein:  100-115 grams/day  Fluid:  >/= 2.0 L/day     Jarome Matin, MS, RD, LDN, [facility] In[facility] Clinical Dietitian Pager # [phone] After hours/weekend pager # [phone]

## 2018-04-08 NOTE — Progress Notes (Signed)
OT Cancellation Note  Patient Details Name: [name] MRN: [id number] DOB: June 22, 1931   Cancelled Treatment:    Reason Eval/Treat Not Completed: Other (comment)  Patient at procedure or test/unavailable related to the trach.    Betsy Pries 04/08/2018, 2:53 PM

## 2018-04-08 NOTE — Progress Notes (Signed)
  Speech Language Pathology Treatment: Dysphagia  Patient Details Name: [name] MRN: [id number] DOB: 1931/06/20 Today's Date: 04/08/2018 Time: 5573-2202 SLP Time Calculation (min) (ACUTE ONLY): 25 min  Assessment / Plan / Recommendation Clinical Impression  SLP spoke to Dr [name] re: TEP replacement. ENT did not change TEP due to leakage around the superior aspect of the prosthesis on swallowing liquids.    ENT recommended to continue tube feeding for nutrition with consideration for PEG.   SLP followed up with pt/family re: education with family to importance of oral care and ENT recommendation for longer alternative means of nutrition.  Pt/wife/family agreeable to pursue G tube- longer term alternative means of nutrition.  SLP advised hospitalist to family/pt wishes.  Daughter's reported goal for pt is to have adequate nutrition, TEP replacement with proper seal to allow pt to have po and for pt to get stronger and be able to ambulate ultimately allowing for pt to be cared for at home.    SLP reviewed importance of oral care (using soft bristle tooth brush to remove biofilm) due to pt having some aspiration around TEP.  Also provided pt/family with separate Yankeur suction for oral vs stoma care.  Per ENT statement to this SLP, pt able to use saline solution for stoma care as it will help to loosen secretions.  Pt reports he thinks he caused the leak with his TEP as he has been cleaning the area "blind".  He reports only noticing leak in the last few days.  SLP uncertain if pt/family only alerted to leak by evaluating SLP this past weekend.       HPI HPI: [name] an 84 y.o.malepast medical history of laryngeal carcinoma status post total laryngectomy, uses tracheoesophageal prosthesis for communication, prostate cancer last PSA 0.15, for which he completed his radiation therapy in 2015 with a new lung mass, he was noted to have increased back pain and MRI of the lumbar spine on  02/26/2018 show foci metastatic disease to L3 vertebrae by tumor. PET scan obtained on 03/17/2018 show 5.4 cm mass in the posterior left lower lobe of the lung, with multiple metastatic lymphadenopathy bilaterally in the mediastinum and increase radiotracer uptake throughout the spine sternum and pelvis.Brought intothe ED for progressive weakness shortness of breath and increased secretion through his tracheal tube.CT chest showed multilobar PNA. Pt seen by our SLP and water was noted to spill around TEP - therefore pt is aspirating.  SLP follow up to faciliate communication and work with ENT re: TEP replacement.      SLP Plan  Continue with current plan of care       Recommendations  Medication Administration: Via alternative means                Follow up Recommendations: (tbd) SLP Visit Diagnosis: Aphonia (R49.1);Dysphagia, unspecified (R13.10) Plan: Continue with current plan of care       GO                [name] 04/08/2018, 3:09 PM  [name], [facility] SLP [phone]

## 2018-04-09 ENCOUNTER — Inpatient Hospital Stay (HOSPITAL_COMMUNITY): Payer: Medicare Other

## 2018-04-09 ENCOUNTER — Ambulatory Visit
Admission: RE | Admit: 2018-04-09 | Discharge: 2018-04-09 | Disposition: A | Discharge status: Home / Self Care | Payer: Medicare Other | Source: Ambulatory Visit | Attending: Radiation Oncology | Admitting: Radiation Oncology

## 2018-04-09 LAB — BASIC METABOLIC PANEL
Anion gap: 9 (ref 5–15)
BUN: 14 mg/dL (ref 8–23)
CHLORIDE: 98 mmol/L (ref 98–111)
CO2: 29 mmol/L (ref 22–32)
CREATININE: 0.58 mg/dL — AB (ref 0.61–1.24)
Calcium: 8.6 mg/dL — ABNORMAL LOW (ref 8.9–10.3)
GFR calc Af Amer: >60 mL/min (ref >60)
GFR calc non Af Amer: >60 mL/min (ref >60)
GLUCOSE: 133 mg/dL — AB (ref 70–99)
Potassium: 3.6 mmol/L (ref 3.5–5.1)
Sodium: 136 mmol/L (ref 135–145)

## 2018-04-09 LAB — CBC WITH DIFFERENTIAL/PLATELET
Basophils Absolute: 0 10*3/uL (ref 0.0–0.1)
Basophils Relative: 0 %
EOS ABS: 0 10*3/uL (ref 0.0–0.7)
EOS PCT: 0 %
HCT: 27.3 % — ABNORMAL LOW (ref 39.0–52.0)
HEMOGLOBIN: 8.7 g/dL — AB (ref 13.0–17.0)
LYMPHS ABS: 0.5 10*3/uL — AB (ref 0.7–4.0)
Lymphocytes Relative: 3 %
MCH: 26.3 pg (ref 26.0–34.0)
MCHC: 31.9 g/dL (ref 30.0–36.0)
MCV: 82.5 fL (ref 78.0–100.0)
MONOS PCT: 4 %
Monocytes Absolute: 0.8 10*3/uL (ref 0.1–1.0)
Neutro Abs: 16.8 10*3/uL — ABNORMAL HIGH (ref 1.7–7.7)
Neutrophils Relative %: 93 %
PLATELETS: 185 10*3/uL (ref 150–400)
RBC: 3.31 MIL/uL — ABNORMAL LOW (ref 4.22–5.81)
RDW: 14.9 % (ref 11.5–15.5)
WBC: 18.2 10*3/uL — ABNORMAL HIGH (ref 4.0–10.5)

## 2018-04-09 LAB — GLUCOSE, CAPILLARY
GLUCOSE-CAPILLARY: 128 mg/dL — AB (ref 70–99)
GLUCOSE-CAPILLARY: 120 mg/dL — AB (ref 70–99)
GLUCOSE-CAPILLARY: 159 mg/dL — AB (ref 70–99)
Glucose-Capillary: 135 mg/dL — ABNORMAL HIGH (ref 70–99)
Glucose-Capillary: 125 mg/dL — ABNORMAL HIGH (ref 70–99)
Glucose-Capillary: 139 mg/dL — ABNORMAL HIGH (ref 70–99)

## 2018-04-09 LAB — PROTIME-INR
INR: 1.33
PROTHROMBIN TIME: 16.3 s — AB (ref 11.4–15.2)

## 2018-04-09 LAB — MAGNESIUM: MAGNESIUM: 2 mg/dL (ref 1.7–2.4)

## 2018-04-09 MED ORDER — BUTAMBEN-TETRACAINE-BENZOCAINE 2-2-14 % EX AERO
1.0000 | INHALATION_SPRAY | CUTANEOUS | Status: DC | PRN
  Filled 2018-04-09: qty 20

## 2018-04-09 MED ORDER — NALOXONE HCL 0.4 MG/ML IJ SOLN
INTRAMUSCULAR | Status: AC
  Filled 2018-04-09: qty 1

## 2018-04-09 MED ORDER — FENTANYL CITRATE (PF) 100 MCG/2ML IJ SOLN
INTRAMUSCULAR | Status: AC
  Filled 2018-04-09: qty 4

## 2018-04-09 MED ORDER — IOPAMIDOL (ISOVUE-300) INJECTION 61%
INTRAVENOUS | Status: AC
  Filled 2018-04-09: qty 50

## 2018-04-09 MED ORDER — MIDAZOLAM HCL 2 MG/2ML IJ SOLN
INTRAMUSCULAR | Status: AC
  Filled 2018-04-09: qty 4

## 2018-04-09 MED ORDER — IOPAMIDOL (ISOVUE-300) INJECTION 61%: 50.0000 mL | Freq: Once | INTRAVENOUS | Status: DC | PRN

## 2018-04-09 MED ORDER — FLUMAZENIL 0.5 MG/5ML IV SOLN
INTRAVENOUS | Status: AC
  Filled 2018-04-09: qty 5

## 2018-04-09 MED ORDER — LIDOCAINE HCL 1 % IJ SOLN
INTRAMUSCULAR | Status: AC
  Filled 2018-04-09: qty 20

## 2018-04-09 NOTE — Progress Notes (Signed)
[facility] Therapy Treatment Record Phone [phone]   Radiation Therapy was administered to [name] on: 04/09/2018  2:59 PM and was treatment # 8 out of a planned course of 10 treatments.  Radiation Treatment  1). Beam photons with 6-10 energy  2). Brachytherapy None  3). Stereotactic Radiosurgery None  4). Other Radiation None     Kimberlea Schlag J Gussie Murton, RT

## 2018-04-09 NOTE — Consult Note (Signed)
Chief Complaint: Patient was seen in consultation today for dysphagia.  Referring Physician(s): Patrecia Pour, Christean Grief  Supervising Physician: Sandi Mariscal  Patient Status: [facility] - In-pt  History of Present Illness: [name] is a 84 y.o. male with a past medical history of hypertension, esophageal stenosis, GERD, tracheal stoma stenosis, prostate cancer, laryngeal cancer, and cataract. He was recently admitted to [facility] for management of multilobar pneumonia thought to be caused from leak through TEP. Speech therapy evaluated patient who recommended gastrostomy tube placement for nutrition.  IR requested by Dr. Patrecia Pour for possible image-guided percutaneous gastrostomy tube placement. Patient awake and alert laying in bed with tracheostomy intact. Accompanied by wife, daughter, and son-in-law at bedside. History difficult to obtain due to tracheostomy.   Past Medical History:  Diagnosis Date  . Cataract, immature   . Esophageal stenosis 05/2016  . Full dentures   . GERD (gastroesophageal reflux disease)   . History of cancer of larynx 2009  . History of prostate cancer 2015  . History of radiation therapy 2015   for prostate cancer  . Hypertension    states under control with meds., has been on med. x 5-6 yr.  . 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 CO2 LASER APPLICATION N/A 8/58/8502   Procedure: CO2 LASER APPLICATION;  Surgeon: Rozetta Nunnery, MD;  Location: [place];  Service: ENT;  Laterality: N/A;  . CO2 LASER APPLICATION N/A 7/74/1287   Procedure: CO2 LASER APPLICATION;  Surgeon: Rozetta Nunnery, MD;  Location: [place];  Service: ENT;  Laterality: N/A;  . DIRECT LARYNGOSCOPY  04/10/2007; 02/23/2007   with bx.  . ESOPHAGEAL DILATION  06/24/2008; 12/04/2009; 12/06/2010; 01/10/2012  . ESOPHAGEAL DILATION N/A 10/12/2013   Procedure: ESOPHAGEAL DILATION;  Surgeon: Rozetta Nunnery, MD;  Location: [place];  Service: ENT;  Laterality: N/A;  . ESOPHAGOSCOPY WITH DILITATION  11/29/2014   Procedure: ESOPHAGOSCOPY WITH DILITATION;  Surgeon: Rozetta Nunnery, MD;  Location: [place];  Service: ENT;;  . LIPOMA EXCISION     abd.   Marland Kitchen MASS EXCISION  01/10/2012   Procedure: EXCISION MASS;  Surgeon: Rozetta Nunnery, MD;  Location: [place];  Service: ENT;  Laterality: N/A;  posterior neck mass  . NECK SURGERY  01/10/2012   tumor removed from back of neck  . PLACEMENT OF TRACHEAL ESOPHAGEAL PROTHESIS, ESOPHAGOSCOPY WITH DILATION  04/07/2008  . PLACEMENT OF TRACHEAL ESOPHAGEAL PROTHESIS, ESOPHAGOSCOPY WITH DILATION N/A 06/14/2016   Procedure: REVISION OF TRACHEAL STOMA WITH CO2 LASER, CHANGE OF TRACHEAL ESOPHAGEAL PROSTHESIS, ESOPHAGOSCOPY WITH DILATION;  Surgeon: Rozetta Nunnery, MD;  Location: [place];  Service: ENT;  Laterality: N/A;  . PROSTATE BIOPSY  04/29/2005  . TONSILLECTOMY    . TOTAL LARYNGECTOMY  10/28/2007  . TRACHEAL ESOPHAGEAL PROSTHESIS (TEP) CHANGE  12/04/2009; 12/06/2010  . TRACHEOSTOMY REVISION N/A 10/12/2013   Procedure: TRACHEOSTOMA  REVSION w/ CO2 LASER;  Surgeon: Rozetta Nunnery, MD;  Location: [place];  Service: ENT;  Laterality: N/A;  . TRACHEOSTOMY REVISION N/A 11/29/2014   Procedure: tracheal esophageal prosthesis change ;  Surgeon: Rozetta Nunnery, MD;  Location: [place];  Service: ENT;  Laterality: N/A;    Allergies: Terazosin and Levothyroxine  Medications: Prior to Admission medications   Medication Sig Start Date End Date Taking? Authorizing Provider  amLODipine (NORVASC) 5 MG tablet Take 5  mg by mouth daily.    Yes [provider]  aspirin 81 MG tablet Take 81 mg by mouth daily.   Yes [provider]  HYDROcodone-acetaminophen (NORCO) 7.5-325 MG tablet Take 1 tablet by mouth every 6 (six) hours as  needed for moderate pain. 03/10/18  Yes Biagio Borg, MD  lisinopril (PRINIVIL,ZESTRIL) 40 MG tablet Take 40 mg by mouth daily.    Yes [provider]  morphine (MS CONTIN) 30 MG 12 hr tablet Take 1 tablet (30 mg total) by mouth every 12 (twelve) hours. 03/25/18  Yes Gery Pray, MD  Multiple Vitamins-Minerals (MULTIVITAMIN ADULT PO) Take by mouth.   Yes [provider]  senna-docusate (SENNA S) 8.6-50 MG tablet Take 1 tablet by mouth 2 (two) times daily. Patient taking differently: Take 1 tablet by mouth 2 (two) times daily as needed for mild constipation.  03/24/18  Yes [name], MD  tamsulosin (FLOMAX) 0.4 MG CAPS capsule Take 1 capsule (0.4 mg total) by mouth daily. 11/03/16  Yes Shary Decamp, PA-C  Vitamin D, Ergocalciferol, (DRISDOL) 50000 units CAPS capsule Take 50,000 Units by mouth every 7 (seven) days.   Yes [provider]  dexamethasone (DECADRON) 4 MG tablet Take 1 tablet (4 mg total) by mouth 2 (two) times daily with a meal. Patient not taking: Reported on 03/31/2018 03/26/18   Gery Pray, MD  oxyCODONE (OXY IR/ROXICODONE) 5 MG immediate release tablet Take 1 tablet (5 mg total) by mouth every 4 (four) hours as needed for severe pain. Patient not taking: Reported on 03/25/2018 03/24/18   [name], MD  pantoprazole (PROTONIX) 40 MG tablet Take 40 mg by mouth daily.    [provider]  predniSONE (DELTASONE) 10 MG tablet 3 tabs by mouth per day for 3 days,2tabs per day for 3 days,21tab per day for 3 days Patient not taking: Reported on 03/25/2018 02/12/18   Biagio Borg, MD  ranitidine (ZANTAC) 150 MG capsule Take 150 mg by mouth every evening.     [provider]  tiZANidine (ZANAFLEX) 2 MG tablet Take 1 tablet (2 mg total) by mouth every 6 (six) hours as needed for muscle spasms. Patient not taking: Reported on 03/25/2018 02/12/18   Biagio Borg, MD     Family History  Problem Relation Age of Onset  . Hypertension Father   .  Heart attack Father 18  . Diabetes Brother   . Diabetes Brother   . Colon cancer Neg Hx     Social History   Socioeconomic History  . Marital status: Married    Spouse name: Not on file  . Number of children: 1  . Years of education: Not on file  . Highest education level: Not on file  Occupational History  . Occupation: Retired  Scientific laboratory technician  . Financial resource strain: Not on file  . Food insecurity:    Worry: Not on file    Inability: Not on file  . Transportation needs:    Medical: Not on file    Non-medical: Not on file  Tobacco Use  . Smoking status: Former Smoker    Years: 25.00    Last attempt to quit: 01/07/1987    Years since quitting: 31.2  . Smokeless tobacco: Never Used  Substance and Sexual Activity  . Alcohol use: No  . Drug use: No  . Sexual activity: Not on file  Lifestyle  . Physical activity:    Days per week: Not on file  Minutes per session: Not on file  . Stress: Not on file  Relationships  . Social connections:    Talks on phone: Not on file    Gets together: Not on file    Attends religious service: Not on file    Active member of club or organization: Not on file    Attends meetings of clubs or organizations: Not on file    Relationship status: Not on file  Other Topics Concern  . Not on file  Social History Narrative   Daily caffeine      Review of Systems: A 12 point ROS discussed and pertinent positives are indicated in the HPI above.  All other systems are negative.  Review of Systems  Unable to perform ROS: Intubated    Vital Signs: BP (!) 121/51 (BP Location: Left Arm)   Pulse 92   Temp 98.7 F (37.1 C) (Oral)   Resp 16   Ht 5' 9.02" (1.753 m)   Wt 191 lb 2.2 oz (86.7 kg)   SpO2 94%   BMI 28.21 kg/m   Physical Exam  Constitutional: He appears well-developed and well-nourished. No distress.  Tracheostomy.  Cardiovascular: Normal rate, regular rhythm and normal heart sounds.  No murmur heard. Pulmonary/Chest:  Effort normal and breath sounds normal. No respiratory distress. He has no wheezes.  Tracheostomy.  Neurological: He is alert.  Skin: Skin is warm and dry.  Psychiatric:  Tracheostomy.     MD Evaluation Airway: WNL Airway comments: Tracheostomy Heart: WNL Abdomen: WNL Chest/ Lungs: WNL Chest/ lungs comments: + rhonchi bilaterally ASA  Classification: 3 Mallampati/Airway Score: Three(Tracheostomy)   Imaging: Dg Chest 1 View  Result Date: 04/06/2018 CLINICAL DATA:  Acute respiratory failure with hypoxia EXAM: CHEST  1 VIEW COMPARISON:  Chest x-ray of March 31, 2018 and chest CT scan of the same date. FINDINGS: The lungs are well-expanded. There is alveolar infiltrate inferiorly in the right upper lobe and in the left lower lobe. There is a small left pleural effusion. There is pleural based soft tissue density overlying the lateral aspect of the left seventh and eighth ribs which is not new. The heart is mildly enlarged. The pulmonary vascularity is not engorged. There is tortuosity of the descending thoracic aorta. IMPRESSION: Bilateral pneumonia. The findings in the right lung have worsened since the previous study. The findings on the left are stable to slightly less conspicuous. Electronically Signed   By: David  Martinique M.D.   On: 04/06/2018 13:25   Dg Chest 1 View  Result Date: 03/31/2018 CLINICAL DATA:  84 year old male with weakness.  Tracheostomy. EXAM: CHEST  1 VIEW COMPARISON:  PET-CT dated 03/17/2018 FINDINGS: A tracheostomy is noted. Bilateral mid to lower lung field densities likely combination of pleural effusions and associated atelectatic changes. Pneumonia is not excluded. Clinical correlation is recommended. The left lower lobe mass seen on the prior PET-CT is not well visualized on this radiograph. There is no pneumothorax. The cardiac silhouette is within normal limits. No acute osseous pathology. IMPRESSION: Bilateral pleural effusions with associated atelectatic changes  of the lower lobes. Pneumonia is not excluded. Clinical correlation is recommended. Electronically Signed   By: Anner Crete M.D.   On: 03/31/2018 01:26   Dg Abdomen 1 View  Result Date: 03/31/2018 CLINICAL DATA:  84 year old male status post NG tube placement. EXAM: ABDOMEN - 1 VIEW COMPARISON:  Chest CTA 0400 hours today. FINDINGS: Portable AP supine view at 0433 hours. Enteric tube courses from the mediastinum to the epigastrium.  Side hole is at the level of the gastroesophageal junction. Stable visible lung bases. Excreted IV contrast in nondilated bilateral renal collecting systems. Visualized bowel gas pattern is non obstructed. No acute osseous abnormality identified. IMPRESSION: 1. Enteric tube courses to the stomach. Side hole is at the GEJ level, advance 5 cm to ensure side hole placement within the stomach. 2. Nonobstructed visible bowel gas pattern. Nonobstructed kidneys with IV contrast excretion. Electronically Signed   By: Genevie Ann M.D.   On: 03/31/2018 05:09   Ct Angio Chest Pe W Or Wo Contrast  Result Date: 03/31/2018 CLINICAL DATA:  84 year old male with dyspnea. EXAM: CT ANGIOGRAPHY CHEST WITH CONTRAST TECHNIQUE: Multidetector CT imaging of the chest was performed using the standard protocol during bolus administration of intravenous contrast. Multiplanar CT image reconstructions and MIPs were obtained to evaluate the vascular anatomy. CONTRAST:  127mL ISOVUE-370 IOPAMIDOL (ISOVUE-370) INJECTION 76% COMPARISON:  Chest radiograph dated 03/31/2018 and PET-CT dated 03/17/2018. FINDINGS: Cardiovascular: There is no cardiomegaly or pericardial effusion. The thoracic aorta is unremarkable. Probable diminutive left vertebral artery. The remainder of the visualized origins of the great vessels of the aortic arch appear patent. Evaluation of the pulmonary arteries is very limited due to respiratory motion artifact as well as streak artifact caused by patient's arms. No large or central pulmonary  artery embolus identified. Mediastinum/Nodes: Bilateral hilar adenopathy measure 15 mm on the right approximately 15 mm on the left. A 13 mm rounded prevascular lymph node noted. Esophagus is grossly unremarkable. No mediastinal fluid collection. Lungs/Pleura: There is a small left pleural effusion. There is near complete consolidative changes of the left lower lobe as well as the large area of consolidative change with air bronchogram in the right lower lobe. Additional airspace density in the right upper lobe. These consolidative areas are new compared to the prior CT most consistent with multifocal pneumonia. The previously seen left lower lobe mass is not well visualized due to consolidative changes of the left lower lobe. There is a background of emphysema. There is a 4 mm left upper lobe subpleural nodule. There is no pneumothorax. A tracheostomy is noted. There is high-grade narrowing of the left lower lobe bronchus as well as narrowing of the right lower lobe bronchus. This is likely related to mass effect caused by hilar adenopathy. Upper Abdomen: Right renal upper pole cyst. The visualized upper abdomen is otherwise unremarkable. Musculoskeletal: T8 sclerotic lesion seen on the PET-CT. There is osteopenia. Scattered lytic lesions involving T6 with associated mild compression of the superior endplate consistent with a pathologic fracture. There is lytic metastatic disease involving T11 and T12 as well as sternum. These lesions are better seen on the prior PET-CT. Review of the MIP images confirms the above findings. IMPRESSION: 1. Very limited evaluation of the pulmonary arteries due to respiratory motion artifact. No large or central pulmonary artery embolus. 2. Large areas of consolidation involving the lower lobes as well as right upper lobe most consistent with pneumonia. There is high-grade narrowing of the left lower lobe bronchus as well as narrowing of the right lower lobe bronchus likely related to  mass effect caused by hilar adenopathy. 3. Small left pleural effusion increased in size compared to the PET-CT of 03/17/2018. 4. Multiple osseous lytic lesions with minimally depressed pathology fracture of the superior endplate of T6. 5.  Emphysema (ICD10-J43.9). Electronically Signed   By: Anner Crete M.D.   On: 03/31/2018 04:47   Ct Biopsy  Result Date: 04/01/2018 INDICATION: 84 year old with history  of prostate and laryngeal cancer. The patient now has lung lesions and evidence for metastatic disease. Tissue diagnosis is needed. Lytic lesion involving the L3 left transverse process was targeted for biopsy. EXAM: CT-GUIDED CORE BIOPSY OF THE L3 TRANSVERSE PROCESS LESION MEDICATIONS: None. ANESTHESIA/SEDATION: Moderate (conscious) sedation was employed during this procedure. A total of Versed 1 mg and Fentanyl 25 mcg was administered intravenously. Moderate Sedation Time: 10 minutes. The patient's level of consciousness and vital signs were monitored continuously by radiology nursing throughout the procedure under my direct supervision. FLUOROSCOPY TIME:  None COMPLICATIONS: None immediate. PROCEDURE: Informed written consent was obtained from the patient after a thorough discussion of the procedural risks, benefits and alternatives. All questions were addressed. A timeout was performed prior to the initiation of the procedure. Patient was placed on his left side. CT images through the lumbar spine were obtained. The L3 left transverse process lesion was identified and targeted for biopsy. The back was prepped with chlorhexidine and sterile field was created. Skin and soft tissues were anesthetized with 1% lidocaine. 17 gauge coaxial needle was directed into the expansile lytic lesion with CT guidance. Core biopsies were obtained with an 18 gauge device. Specimens placed in formalin. Coaxial needle was removed without complication. Bandage placed over the puncture site. FINDINGS: Expansile lytic lesion  involving the L3 left transverse process. Needle position confirmed within the lesion. Adequate core biopsies were placed in formalin. IMPRESSION: CT-guided core biopsies of the L3 left transverse process lytic lesion. Electronically Signed   By: [name] M.D.   On: 04/01/2018 20:17   Dg Abd Portable 1v  Result Date: 04/07/2018 CLINICAL DATA:  Feeding tube placement. EXAM: PORTABLE ABDOMEN - 1 VIEW COMPARISON:  03/31/2018.  CT 03/17/2018. FINDINGS: Feeding tube noted with its tip over the distal stomach. Distended loops of small and large bowel noted suggesting adynamic ileus. No free air. Bibasilar atelectasis/infiltrates and left-sided pleural effusion. IMPRESSION: 1.  Feeding tube noted with its tip over the distal stomach. 2. Distended loops of small and large bowel most consistent with adynamic ileus. Follow-up exam to demonstrate resolution suggested. 3. Bibasilar atelectasis/infiltrates and left-sided pleural effusion. Electronically Signed   By: Marcello Moores  Register   On: 04/07/2018 16:31   Nm Pet (axumin) Skull Base To Mid Thigh  Result Date: 03/18/2018 CLINICAL DATA:  Prostate carcinoma with biochemical recurrence. Personal history of laryngeal carcinoma. EXAM: NUCLEAR MEDICINE PET SKULL BASE TO THIGH TECHNIQUE: 10.2 mCi F-18 Fluciclovine was injected intravenously. Full-ring PET imaging was performed from the skull base to thigh after the radiotracer. CT data was obtained and used for attenuation correction and anatomic localization. COMPARISON:  Lumbar MRI on 02/26/2018 FINDINGS: NECK No radiotracer activity in neck lymph nodes. Incidental CT finding: None CHEST A 5.4 cm mass is seen in the posterior left lower lobe which shows radiotracer uptake. This is highly suspicious for primary bronchogenic carcinoma. There are other scattered sub-cm pulmonary nodule seen in both lungs which show no radiotracer uptake, but are suspicious for small pulmonary metastases. Moderate emphysema is also noted. A  focus of radiotracer uptake is seen within the left lateral pleural space, consistent with pleural metastasis. Radiotracer uptake is seen within mild left hilar lymphadenopathy. Mild mediastinal lymphadenopathy is also seen subcarinal, AP window, lateral aortic, and right paratracheal regions which shows radiotracer uptake. Incidental CT finding: None ABDOMEN/PELVIS Prostate: Several fiducial markers are seen within the prostate which is mildly enlarged. Mild to moderate diffuse radiotracer uptake is seen prostate gland. Lymph nodes: No abnormal  radiotracer accumulation within pelvic or abdominal nodes. Liver: No evidence of liver metastasis Incidental CT finding: None SKELETON Foci of increased radiotracer uptake are seen throughout the spine, sternum, and pelvis, which correspond to lytic bone lesions on CT. These are not typical for prostate carcinoma, and are suspicious for metastatic lung carcinoma or possibly laryngeal carcinoma. IMPRESSION: Diffuse lytic bone metastases, not typical for prostate carcinoma, and instead suspicious for metastatic lung or possibly laryngeal carcinoma. 5.4 cm lung mass in posterior left lower lobe, highly suspicious for primary bronchogenic carcinoma. Metastatic lymphadenopathy in left hilum and bilateral mediastinum. Left lateral pleural based metastasis and tiny pleural effusion. These results will be called to the ordering clinician or representative by the Radiologist Assistant, and communication documented in the PACS or zVision Dashboard. Electronically Signed   By: Earle Gell M.D.   On: 03/18/2018 08:39    Labs:  CBC: Recent Labs    04/02/18 0449 04/03/18 0432 04/07/18 0454 04/09/18 0418  WBC 15.7* 13.5* 18.7* 18.2*  HGB 9.4* 9.5* 9.7* 8.7*  HCT 31.6* 30.6* 30.3* 27.3*  PLT 281 200 189 185    COAGS: Recent Labs    03/31/18 1136  INR 1.40  APTT 37*    BMP: Recent Labs    04/04/18 0403 04/05/18 0528 04/06/18 0642 04/07/18 0454 04/08/18 0355  04/09/18 0418  NA 146* 135  --  134*  --  136  K 3.6 3.7  --  3.9  --  3.6  CL 105 97*  --  96*  --  98  CO2 30 29  --  28  --  29  GLUCOSE 115* 124*  --  145*  --  133*  BUN 19 12  --  11  --  14  CALCIUM 9.1 8.6*  --  8.8*  --  8.6*  CREATININE 0.65 0.64 0.62 0.63 0.68 0.58*  GFRNONAA >60 >60 >60 >60 >60 >60  GFRAA >60 >60 >60 >60 >60 >60    LIVER FUNCTION TESTS: Recent Labs    08/05/17 1458 02/12/18 0942 03/10/18 1107 03/31/18 0010  BILITOT 0.4 0.5 0.5 1.0  AST 49* 28 19 52*  ALT 49 28 18 40  ALKPHOS 65 80 101 141*  PROT 7.3 7.2 6.9 6.2*  ALBUMIN 4.3 3.9 3.6 2.5*    TUMOR MARKERS: No results for input(s): AFPTM, CEA, CA199, CHROMGRNA in the last 8760 hours.  Assessment and Plan:  Dysphagia. Plan for image-guided percutaneous gastrostomy tube placement. Patient is NPO. Denies fever. He does not take blood thinners. INR pending.  Risks and benefits discussed with the patient including, but not limited to the need for a barium enema during the procedure, bleeding, infection, peritonitis, or damage to adjacent structures. All of the patient's questions were answered, patient is agreeable to proceed. Consent signed and in chart.   Thank you for this interesting consult.  I greatly enjoyed meeting Woodrow Drab and look forward to participating in their care.  A copy of this report was sent to the requesting provider on this date.  Electronically Signed: Earley Abide, PA-C 04/09/2018, 1:38 PM   I spent a total of 20 Minutes in face to face in clinical consultation, greater than 50% of which was counseling/coordinating care for dysphagia.

## 2018-04-09 NOTE — Progress Notes (Signed)
CSW following for assistance with disposition.  Planning for SNF admission for rehab at [place]. [facility] has selected [facility] to assess whether pt's clinical needs can be managed at facility at [place] (plan for feeding tube as well as aware of pt's TEP). Will continue following.  Sharren Bridge, MSW, LCSW Clinical Social Work 04/09/2018 [phone]

## 2018-04-09 NOTE — Progress Notes (Signed)
OT Cancellation Note  Patient Details Name: [name] MRN: [id number] DOB: 1931/09/01   Cancelled Treatment:    Reason Eval/Treat Not Completed: Other (comment). [name] getting ready for XRT and will need premedication.  Will check either later or another day.  Metta Koranda 04/09/2018, 1:53 PM  Lesle Chris, OTR/L [phone] 04/09/2018

## 2018-04-09 NOTE — Progress Notes (Signed)
PT Cancellation Note  Patient Details Name: [name] MRN: [id number] DOB: 1931-03-17   Cancelled Treatment:    Reason Eval/Treat Not Completed: Other (comment) Per RN, patient will be leaving for scheduled radiation session at any time now. Unable to work with patient due to timing of this scheduled procedure. Will attempt to try back if time/schedule allows and patient is agreeable.    Deniece Ree PT, DPT, CBIS  Supplemental Physical Therapist [facility]   Pager [phone]

## 2018-04-09 NOTE — Progress Notes (Signed)
Pharmacy Antibiotic Note  [name] is a 84 y.o. male admitted on 03/30/2018 with aspiration PNA.  Pharmacy has been consulted for Unasyn dosing. Patient is Day 10 total Abxs - started on Vanc/Zosyn switched to Levaquin now to change to Unasyn for worsening CXR  Today, 04/09/2018:  Afebrile  WBC remains elevated  SCr stable  O2 requirements remain stable; stoma replaced 7/25 AM  Plan:  Continue Unasyn 3g IV q6 for CrCl > 30 ml/min  Pharmacy will sign off, following peripherally for renal adjustments and culture results  May reconsult if clinical status worsens, warranting re-evaluation of antibiotic therapy  Height: 5' 9.02" (175.3 cm) Weight: 191 lb 2.2 oz (86.7 kg) IBW/kg (Calculated) : 70.74  Temp (24hrs), Avg:98.8 F (37.1 C), Min:98.5 F (36.9 C), Max:99.1 F (37.3 C)  Recent Labs  Lab 04/03/18 0432  04/05/18 0528 04/06/18 0642 04/07/18 0454 04/08/18 0355 04/09/18 0418  WBC 13.5*  --   --   --  18.7*  --  18.2*  CREATININE 0.67   < > 0.64 0.62 0.63 0.68 0.58*   < > = values in this interval not displayed.    Estimated Creatinine Clearance: 73.6 mL/min (A) (by C-G formula based on SCr of 0.58 mg/dL (L)).    Allergies  Allergen Reactions  . Terazosin Other (See Comments)    DIZZINESS  . Levothyroxine Other (See Comments)    Thank you for allowing pharmacy to be a part of this patient's care.   Reuel Boom, PharmD, BCPS [phone] 04/09/2018, 1:48 PM

## 2018-04-09 NOTE — Care Management Important Message (Signed)
Important Message  Patient Details  Name: Elek Holderness MRN: [id number] Date of Birth: [birth year]   Medicare Important Message Given:  Yes    Kerin Salen 04/09/2018, 10:44 AMImportant Message  Patient Details  Name: Eulises Kijowski MRN: [id number] Date of Birth: [birth year]   Medicare Important Message Given:  Yes    Kerin Salen 04/09/2018, 10:44 AM

## 2018-04-09 NOTE — Consult Note (Signed)
Patients TEP prosthesis was changed today from a Provox II 8 mm  To a Provox Vega 6 mm  With the assistance of Candace who works with these prostheses. Patient tolerated this well. You can try sips of liquids to see if it still leaks around the prothesis.

## 2018-04-09 NOTE — Progress Notes (Signed)
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  Patient presented to the emergency department with progressive weakness, shortness of breath and increased secretions through his tracheal tube.  CT chest showed multilobar pneumonia and patient was admitted with working diagnosis of acute respiratory failure due to healthcare associated pneumonia.  During hospital stay had  biopsy which confirmed the diagnosis of a squamous cell.  Hospitalization was complicated with aspiration pneumonia, concern of leak through the TEP placing patient on high risk for aspiration.  Patient currently on antibiotic therapy.   Subjective: Patient seen and examined, pain is well controlled.  No complaints today.  TEP was replaced  Assessment & Plan: Acute respiratory failure with hypoxia due to HCAP, worsened with aspiration during hospital stay. Patient was initially treated with Levaquin, now changed to Unasyn to treat for aspiration. Patient with TEP which is presumed to be leaking increase in the risk of aspiration.  Chest x-ray done on 7/22 showed worsening infiltrates.  Patient remains afebrile.  Oxygen requirement are at baseline.  TEP was replaced today.   Moderate malnutrition due dysphagia and chronic diseases Laryngeal carcinoma status post tracheostomy, currently with leaky TEP.  Unable to swallow without risk of aspiration. SLP to assess adequacy of seal in a.m.  IR has been consulted for PEG tube placement.  Essential hypertension BP stable, continue to monitor  Stage IV squamous cell carcinoma of the lung with metastasis to the bone, L3 transverse process Pain control, patient receiving  radiation therapy.  Oncology recommendations appreciated.  Acute blood loss anemia Hemoglobin remains stable with no signs of overt bleeding. Continue to monitor CBC   DVT prophylaxis: SCDs Code Status: DNR Family Communication: Family at bedside Disposition Plan: SNF when medically stable  Consultants:   Oncology  Palliative care  Procedures:     Antimicrobials: Anti-infectives (From admission, onward)   Start     Dose/Rate Route Frequency Ordered Stop   04/07/18 1000  Ampicillin-Sulbactam (UNASYN) 3 g in sodium chloride 0.9 % 100 mL IVPB     3 g 200 mL/hr over 30 Minutes Intravenous Every 6 hours 04/07/18 0821     04/05/18 1200  levofloxacin (LEVAQUIN) IVPB 500 mg  Status:  Discontinued    Note to Pharmacy:  Last dose 7/24.changed to iv as patient having difficulty swallowing   500 mg 100 mL/hr over 60 Minutes Intravenous Daily 04/05/18 1046 04/07/18 0821   04/02/18 1200  levofloxacin (LEVAQUIN) 25 MG/ML solution 500 mg  Status:  Discontinued     500 mg Oral Daily 04/02/18 1017 04/02/18 1115   04/02/18 1200  levofloxacin (LEVAQUIN) tablet 500 mg  Status:  Discontinued     500 mg Oral Every 24 hours 04/02/18 1115 04/05/18 1046   03/31/18 2000  vancomycin (VANCOCIN) 1,250 mg in sodium chloride 0.9 % 250 mL IVPB  Status:  Discontinued     1,250 mg 166.7 mL/hr over 90 Minutes Intravenous Every 24 hours 03/31/18 1140 04/02/18 1013   03/31/18 1400  ceFEPIme (MAXIPIME) 1 g in sodium chloride 0.9 % 100 mL IVPB  Status:  Discontinued     1 g 200 mL/hr over 30 Minutes Intravenous Every 8 hours 03/31/18 1106 03/31/18 1257  03/31/18 1400  piperacillin-tazobactam (ZOSYN) IVPB 3.375 g  Status:  Discontinued     3.375 g 12.5 mL/hr over 240 Minutes Intravenous Every 8 hours 03/31/18 1257 04/02/18 1013   03/31/18 0200  vancomycin (VANCOCIN) IVPB 1000 mg/200 mL premix     1,000 mg 200 mL/hr over 60 Minutes Intravenous  Once 03/31/18 0157 03/31/18 0528   03/31/18 0200   piperacillin-tazobactam (ZOSYN) IVPB 3.375 g     3.375 g 12.5 mL/hr over 240 Minutes Intravenous  Once 03/31/18 0157 03/31/18 0501      Objective: Vitals:   04/09/18 0306 04/09/18 0823 04/09/18 1124 04/09/18 1517  BP:    (!) 119/52  Pulse: 76 88 92 78  Resp: 17 16 16 16   Temp:    98.7 F (37.1 C)  TempSrc:    Oral  SpO2: 97% 90% 94% 96%  Weight:      Height:        Intake/Output Summary (Last 24 hours) at 04/09/2018 1615 Last data filed at 04/09/2018 1041 Gross per 24 hour  Intake 1742.92 ml  Output 1700 ml  Net 42.92 ml   Filed Weights   03/31/18 2155 04/08/18 0551 04/09/18 0248  Weight: 84.1 kg (185 lb 6.5 oz) 80.7 kg (177 lb 14.6 oz) 86.7 kg (191 lb 2.2 oz)    Examination:  General: [name] alert, awake, not in acute distress HEENT: TEP in place, no signs of infection  Cardiovascular: RRR, S1/S2 +, no rubs, no gallops Respiratory: CTA bilaterally, no wheezing, no rhonchi Abdominal: Soft, NT, ND, + bowel sounds Extremities: no edema  Data Reviewed: I have personally reviewed following labs and imaging studies  CBC: Recent Labs  Lab 04/03/18 0432 04/07/18 0454 04/09/18 0418  WBC 13.5* 18.7* 18.2*  NEUTROABS  --   --  16.8*  HGB 9.5* 9.7* 8.7*  HCT 30.6* 30.3* 27.3*  MCV 84.3 82.6 82.5  PLT 200 189 256   Basic Metabolic Panel: Recent Labs  Lab 04/03/18 0432 04/04/18 0403 04/05/18 0528 04/06/18 0642 04/07/18 0454 04/08/18 0355 04/09/18 0418  NA 144 146* 135  --  134*  --  136  K 3.5 3.6 3.7  --  3.9  --  3.6  CL 106 105 97*  --  96*  --  98  CO2 30 30 29   --  28  --  29  GLUCOSE 128* 115* 124*  --  145*  --  133*  BUN 25* 19 12  --  11  --  14  CREATININE 0.67 0.65 0.64 0.62 0.63 0.68 0.58*  CALCIUM 8.9 9.1 8.6*  --  8.8*  --  8.6*  MG  --   --   --   --   --   --  2.0   GFR: Estimated Creatinine Clearance: 73.6 mL/min (A) (by C-G formula based on SCr of 0.58 mg/dL (L)). Liver Function Tests: No results for input(s): AST, ALT, ALKPHOS,  BILITOT, PROT, ALBUMIN in the last 168 hours. No results for input(s): LIPASE, AMYLASE in the last 168 hours. No results for input(s): AMMONIA in the last 168 hours. Coagulation Profile: Recent Labs  Lab 04/09/18 1359  INR 1.33   Cardiac Enzymes: No results for input(s): CKTOTAL, CKMB, CKMBINDEX, TROPONINI in the last 168 hours. BNP (last 3 results) No results for input(s): PROBNP in the last 8760 hours. HbA1C: No results for input(s): HGBA1C in the last 72 hours. CBG: Recent Labs  Lab 04/08/18 2022 04/09/18 0103 04/09/18 0518 04/09/18 0734 04/09/18  [place]   Lipid Profile: No results for input(s): CHOL, HDL, LDLCALC, TRIG, CHOLHDL, LDLDIRECT in the last 72 hours. Thyroid Function Tests: No results for input(s): TSH, T4TOTAL, FREET4, T3FREE, THYROIDAB in the last 72 hours. Anemia Panel: No results for input(s): VITAMINB12, FOLATE, FERRITIN, TIBC, IRON, RETICCTPCT in the last 72 hours. Sepsis Labs: No results for input(s): PROCALCITON, LATICACIDVEN in the last 168 hours.  Recent Results (from the past 240 hour(s))  Culture, blood (routine x 2) Call MD if unable to obtain prior to antibiotics being given     Status: None   Collection Time: 03/31/18 11:36 AM  Result Value Ref Range Status   Specimen Description   Final    BLOOD RIGHT ANTECUBITAL Performed at [facility] [address] [place], [place] [address]    Special Requests   Final    BOTTLES DRAWN AEROBIC AND ANAEROBIC Blood Culture adequate volume Performed at [facility] [address], [place], [place] [address]    Culture   Final    NO GROWTH 5 DAYS Performed at [facility], [place] [address] [place], [place] [address]    Report Status 04/05/2018 FINAL  Final  Culture, blood (routine x 2) Call MD if unable to obtain prior to antibiotics being given     Status: None   Collection Time: 03/31/18 11:44 AM  Result Value Ref Range  Status   Specimen Description   Final    BLOOD RIGHT HAND Performed at [facility] [address], [place], [place] [address]    Special Requests   Final    BOTTLES DRAWN AEROBIC AND ANAEROBIC Blood Culture adequate volume Performed at [facility] [address], [place],  [place] [address]    Culture   Final    NO GROWTH 5 DAYS Performed at [facility], [place] [address] [place], [place] [address]    Report Status 04/05/2018 FINAL  Final  MRSA PCR Screening     Status: Abnormal   Collection Time: 04/01/18  2:15 PM  Result Value Ref Range Status   MRSA by PCR POSITIVE (A) NEGATIVE Final    Comment:        The GeneXpert MRSA Assay (FDA approved for NASAL specimens only), is one component of a comprehensive MRSA colonization surveillance program. It is not intended to diagnose MRSA infection nor to guide or monitor treatment for MRSA infections. RESULT CALLED TO, READ BACK BY AND VERIFIED WITH: Vincenza Hews [id number] @ [address] Performed at [facility] [address] [place], [place] [address]       Radiology Studies: Dg Abd Portable 1v  Result Date: 04/07/2018 CLINICAL DATA:  Feeding tube placement. EXAM: PORTABLE ABDOMEN - 1 VIEW COMPARISON:  03/31/2018.  CT 03/17/2018. FINDINGS: Feeding tube noted with its tip over the distal stomach. Distended loops of small and large bowel noted suggesting adynamic ileus. No free air. Bibasilar atelectasis/infiltrates and left-sided pleural effusion. IMPRESSION: 1.  Feeding tube noted with its tip over the distal stomach. 2. Distended loops of small and large bowel most consistent with adynamic ileus. Follow-up exam to demonstrate resolution suggested. 3. Bibasilar atelectasis/infiltrates and left-sided pleural effusion. Electronically Signed   By: Marcello Moores  Register   On: 04/07/2018 16:31     Scheduled Meds: . albuterol  2.5 mg Nebulization QID  . dexamethasone   4 mg Intravenous Q12H  . feeding supplement (PRO-STAT  SUGAR FREE 64)  30 mL Per Tube BID  . fentaNYL  75 mcg Transdermal Q72H  . free water  100 mL Per Tube Q4H  . polyethylene glycol  17 g Oral Daily  . potassium chloride  20 mEq Oral BID  . senna-docusate  1 tablet Oral BID  . tamsulosin  0.4 mg Oral Daily   Continuous Infusions: . ampicillin-sulbactam (UNASYN) IV Stopped (04/09/18 1132)  . dextrose 75 mL/hr at 04/09/18 0329  . famotidine (PEPCID) IV 20 mg (04/09/18 1157)  . feeding supplement (OSMOLITE 1.5 CAL) 55 mL/hr at 04/09/18 1112     LOS: 9 days    Time spent: Total of 25 minutes spent with pt, greater than 50% of which was spent in discussion of  treatment, counseling and coordination of care   Chipper Oman, MD Pager: Text Page via [email or web address]   If 7PM-7AM, please contact night-coverage [email or web address] 04/09/2018, 4:15 PM   Note - This record has been created using [employer]. Chart creation errors have been sought, but may not always have been located. Such creation errors do not reflect on the standard of medical care.

## 2018-04-09 NOTE — Progress Notes (Signed)
Nutrition Brief Follow-up  RD consulted for TF initiation and management. Full follow-up note completed on 7/24.   TF initiated 7/24, Osmolite 1.5 @ 25 ml/hr. TF now at 40 ml/hr. Continue to advance by 10 ml every 8 hours to goal rate of 55 ml/hr with 30 ml Prostat BID.  Continues to be NPO. SLP following.   RD will continue to monitor. If further nutrition issues arise, please consult RD.   Clayton Bibles, MS, RD, [facility] Pager: [phone] After Hours Pager: [phone]

## 2018-04-09 NOTE — Progress Notes (Signed)
SLP sent sign to post above [facility] re: no need for external humidification for oxygen if pt has HME in place.   Informed secretary who stated she would pass signs to RN.    Pt with TEP change this am by Dr Lucia Gaskins and Philis Pique MS [facility] SLP.  SLP to follow up next date to help in determining adequacy of seal to prevent aspiration with po intake.  Will utilize water with blue food coloring to check for leakage.  Discussed with family, pt and MDs (ENT/hospitalist).  Thanks.   Luanna Salk, [facility] SLP [phone]

## 2018-04-10 ENCOUNTER — Inpatient Hospital Stay (HOSPITAL_COMMUNITY): Payer: Medicare Other

## 2018-04-10 ENCOUNTER — Encounter (HOSPITAL_COMMUNITY): Payer: Self-pay | Admitting: Interventional Radiology

## 2018-04-10 ENCOUNTER — Ambulatory Visit
Admission: RE | Admit: 2018-04-10 | Discharge: 2018-04-10 | Disposition: A | Discharge status: Home / Self Care | Payer: Medicare Other | Source: Ambulatory Visit | Attending: Radiation Oncology | Admitting: Radiation Oncology

## 2018-04-10 HISTORY — PX: IR GASTROSTOMY TUBE MOD SED: IMG625

## 2018-04-10 LAB — CBC WITH DIFFERENTIAL/PLATELET
BASOS ABS: 0 10*3/uL (ref 0.0–0.1)
Basophils Relative: 0 %
EOS PCT: 0 %
Eosinophils Absolute: 0 10*3/uL (ref 0.0–0.7)
HEMATOCRIT: 27.6 % — AB (ref 39.0–52.0)
Hemoglobin: 8.6 g/dL — ABNORMAL LOW (ref 13.0–17.0)
Lymphocytes Relative: 6 %
Lymphs Abs: 1.1 10*3/uL (ref 0.7–4.0)
MCH: 26.2 pg (ref 26.0–34.0)
MCHC: 31.2 g/dL (ref 30.0–36.0)
MCV: 84.1 fL (ref 78.0–100.0)
MONO ABS: 0.6 10*3/uL (ref 0.1–1.0)
MONOS PCT: 3 %
NEUTROS PCT: 91 %
Neutro Abs: 17.4 10*3/uL — ABNORMAL HIGH (ref 1.7–7.7)
PLATELETS: 211 10*3/uL (ref 150–400)
RBC: 3.28 MIL/uL — AB (ref 4.22–5.81)
RDW: 14.9 % (ref 11.5–15.5)
WBC: 19.1 10*3/uL — AB (ref 4.0–10.5)

## 2018-04-10 LAB — GLUCOSE, CAPILLARY
GLUCOSE-CAPILLARY: 94 mg/dL (ref 70–99)
Glucose-Capillary: 102 mg/dL — ABNORMAL HIGH (ref 70–99)
Glucose-Capillary: 114 mg/dL — ABNORMAL HIGH (ref 70–99)
Glucose-Capillary: 122 mg/dL — ABNORMAL HIGH (ref 70–99)
Glucose-Capillary: 163 mg/dL — ABNORMAL HIGH (ref 70–99)

## 2018-04-10 MED ORDER — MIDAZOLAM HCL 2 MG/2ML IJ SOLN
INTRAMUSCULAR | Status: AC
  Filled 2018-04-10: qty 2

## 2018-04-10 MED ORDER — FENTANYL CITRATE (PF) 100 MCG/2ML IJ SOLN
INTRAMUSCULAR | Status: AC
  Filled 2018-04-10: qty 2

## 2018-04-10 MED ORDER — MIDAZOLAM HCL 2 MG/2ML IJ SOLN
INTRAMUSCULAR | Status: AC | PRN
  Administered 2018-04-10 (×3): 0.5 mg via INTRAVENOUS

## 2018-04-10 MED ORDER — FENTANYL CITRATE (PF) 100 MCG/2ML IJ SOLN
INTRAMUSCULAR | Status: AC | PRN
  Administered 2018-04-10 (×3): 25 ug via INTRAVENOUS

## 2018-04-10 MED ORDER — FLUMAZENIL 0.5 MG/5ML IV SOLN
INTRAVENOUS | Status: AC
  Filled 2018-04-10: qty 5

## 2018-04-10 MED ORDER — IOPAMIDOL (ISOVUE-300) INJECTION 61%
50.0000 mL | Freq: Once | INTRAVENOUS | Status: AC | PRN
  Administered 2018-04-10: 5 mL

## 2018-04-10 MED ORDER — GLUCAGON HCL RDNA (DIAGNOSTIC) 1 MG IJ SOLR
INTRAMUSCULAR | Status: AC
  Filled 2018-04-10: qty 1

## 2018-04-10 MED ORDER — LIDOCAINE HCL 1 % IJ SOLN
INTRAMUSCULAR | Status: AC
  Filled 2018-04-10: qty 20

## 2018-04-10 MED ORDER — GLUCAGON HCL (RDNA) 1 MG IJ SOLR
INTRAMUSCULAR | Status: AC | PRN
  Administered 2018-04-10: .5 mg via INTRAVENOUS

## 2018-04-10 MED ORDER — IOPAMIDOL (ISOVUE-300) INJECTION 61%
INTRAVENOUS | Status: AC
  Administered 2018-04-10: 5 mL
  Filled 2018-04-10: qty 50

## 2018-04-10 MED ORDER — NALOXONE HCL 0.4 MG/ML IJ SOLN
INTRAMUSCULAR | Status: AC
  Filled 2018-04-10: qty 1

## 2018-04-10 NOTE — Progress Notes (Signed)
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  Patient presented to the emergency department with progressive weakness, shortness of breath and increased secretions through his tracheal tube.  CT chest showed multilobar pneumonia and patient was admitted with working diagnosis of acute respiratory failure due to healthcare associated pneumonia.  During hospital stay had  biopsy which confirmed the diagnosis of a squamous cell.  Hospitalization was complicated with aspiration pneumonia, concern of leak through the TEP placing patient on high risk for aspiration.  Patient currently on antibiotic therapy.   Subjective: Patient seen and examined, reported his pain is well controlled.  Scheduled for PEG tube placement today.  Receiving radiation therapy.  No acute events overnight.  Assessment & Plan: Acute respiratory failure with hypoxia due to HCAP, worsened with aspiration during hospital stay. Patient was initially treated with Levaquin, subsequently started on Unasyn for aspiration, will de-escalate to Augmentin to complete total of 7 days of antibiotics when PEG tube is placed.  Patient with TEP which is presumed to be leaking increase in the risk of aspiration. Chest x-ray done on 7/22 showed worsening infiltrates.  Patient remains afebrile.  Oxygen requirement are at baseline.  TEP was replaced, awaiting swallow eval to assess functionality.    Moderate malnutrition due dysphagia and chronic diseases Laryngeal carcinoma status post tracheostomy, currently with leaky TEP. Unable to swallow without risk of aspiration. SLP to assess adequacy of seal in a.m.  Awaiting PEG  placement.  Essential hypertension BP stable, continue to monitor  Stage IV squamous cell carcinoma of the lung with metastasis to the bone, L3 transverse process Pain control, patient receiving radiation therapy.  Oncology recommendations appreciated.  Acute blood loss anemia Hemoglobin remains stable with no signs of overt bleeding. Continue to monitor CBC   DVT prophylaxis: SCDs Code Status: DNR Family Communication: Family at bedside Disposition Plan: SNF when medically stable  Consultants:   Oncology  Palliative care  Procedures:     Antimicrobials: Anti-infectives (From admission, onward)   Start     Dose/Rate Route Frequency Ordered Stop   04/07/18 1000  Ampicillin-Sulbactam (UNASYN) 3 g in sodium chloride 0.9 % 100 mL IVPB     3 g 200 mL/hr over 30 Minutes Intravenous Every 6 hours 04/07/18 0821     04/05/18 1200  levofloxacin (LEVAQUIN) IVPB 500 mg  Status:  Discontinued    Note to Pharmacy:  Last dose 7/24.changed to iv as patient having difficulty swallowing   500 mg 100 mL/hr over 60 Minutes Intravenous Daily 04/05/18 1046 04/07/18 0821   04/02/18 1200  levofloxacin (LEVAQUIN) 25 MG/ML solution 500 mg  Status:  Discontinued     500 mg Oral Daily 04/02/18 1017 04/02/18 1115   04/02/18 1200  levofloxacin (LEVAQUIN) tablet 500 mg  Status:  Discontinued     500 mg Oral Every 24 hours 04/02/18 1115 04/05/18 1046   03/31/18 2000  vancomycin (VANCOCIN) 1,250 mg in sodium chloride 0.9 % 250 mL IVPB  Status:  Discontinued     1,250 mg 166.7 mL/hr over 90 Minutes Intravenous Every 24 hours 03/31/18 1140 04/02/18 1013   03/31/18 1400  ceFEPIme (MAXIPIME) 1 g in sodium chloride 0.9 % 100 mL IVPB  Status:  Discontinued     1 g 200 mL/hr over 30 Minutes Intravenous Every 8 hours 03/31/18 1106 03/31/18 1257   03/31/18 1400  piperacillin-tazobactam (ZOSYN) IVPB 3.375 g  Status:  Discontinued     3.375 g 12.5 mL/hr over 240 Minutes Intravenous Every 8 hours 03/31/18 1257  04/02/18 1013   03/31/18 0200  vancomycin (VANCOCIN) IVPB 1000 mg/200 mL premix     1,000 mg 200 mL/hr over 60 Minutes Intravenous  Once 03/31/18 0157 03/31/18 0528   03/31/18 0200  piperacillin-tazobactam (ZOSYN) IVPB 3.375 g     3.375 g 12.5 mL/hr over 240 Minutes Intravenous  Once 03/31/18 0157 03/31/18 0501      Objective: Vitals:   04/10/18 1500 04/10/18 1535 04/10/18 1537 04/10/18 1538  BP:  114/62 114/62   Pulse:  85 91 86  Resp:   15 16  Temp:      TempSrc:      SpO2:  95% 99% 99%  Weight: 86.6 kg (191 lb)     Height:        Intake/Output Summary (Last 24 hours) at 04/10/2018 1540 Last data filed at 04/10/2018 1415 Gross per 24 hour  Intake 160 ml  Output 1050 ml  Net -890 ml   Filed Weights   04/08/18 0551 04/09/18 0248 04/10/18 1500  Weight: 80.7 kg (177 lb 14.6 oz) 86.7 kg (191 lb 2.2 oz) 86.6 kg (191 lb)    Examination:  General: [name] alert, awake, not in acute distress HEENT: TEP in place, no signs of infection  Cardiovascular: RRR, S1/S2 +, no rubs, no gallops Respiratory: CTA bilaterally, no wheezing, no rhonchi Abdominal: Soft, NT, ND, + bowel sounds Extremities: no edema  Data Reviewed: I have personally reviewed following labs and imaging studies  CBC: Recent Labs  Lab 04/07/18 0454 04/09/18 0418 04/10/18 0415  WBC 18.7* 18.2* 19.1*  NEUTROABS  --  16.8* 17.4*  HGB 9.7* 8.7* 8.6*  HCT 30.3* 27.3* 27.6*  MCV 82.6 82.5 84.1  PLT 189 185 716   Basic Metabolic Panel: Recent Labs  Lab 04/04/18 0403 04/05/18 0528 04/06/18 0642 04/07/18 0454 04/08/18 0355 04/09/18 0418  NA 146* 135  --  134*  --  136  K 3.6 3.7  --  3.9  --  3.6  CL 105 97*  --  96*  --  98  CO2 30 29  --  28  --  29  GLUCOSE 115* 124*  --  145*  --  133*  BUN 19 12  --  11  --  14  CREATININE 0.65 0.64 0.62 0.63 0.68 0.58*  CALCIUM 9.1 8.6*  --  8.8*  --  8.6*  MG  --   --   --   --   --  2.0   GFR: Estimated Creatinine Clearance: 73.6 mL/min (A) (by C-G  formula based on SCr of 0.58 mg/dL (L)). Liver Function Tests: No results for input(s): AST, ALT, ALKPHOS, BILITOT, PROT, ALBUMIN in the last 168 hours. No results for input(s): LIPASE, AMYLASE in the last 168 hours. No results for input(s): AMMONIA in the last 168 hours. Coagulation Profile: Recent Labs  Lab 04/09/18 1359  INR 1.33   Cardiac Enzymes: No results for input(s): CKTOTAL, CKMB, CKMBINDEX, TROPONINI in the last 168 hours. BNP (last 3 results) No results for input(s): PROBNP in the last 8760 hours. HbA1C: No results for input(s): HGBA1C in the last 72 hours. CBG: Recent Labs  Lab 04/09/18 2108 04/10/18  0034 04/10/18 0546 04/10/18 0742 04/10/18 1206  GLUCAP 159* 163* 102* 94 122*   Lipid Profile: No results for input(s): CHOL, HDL, LDLCALC, TRIG, CHOLHDL, LDLDIRECT in the last 72 hours. Thyroid Function Tests: No results for input(s): TSH, T4TOTAL, FREET4, T3FREE, THYROIDAB in the last 72 hours. Anemia Panel: No results for input(s): VITAMINB12, FOLATE, FERRITIN, TIBC, IRON, RETICCTPCT in the last 72 hours. Sepsis Labs: No results for input(s): PROCALCITON, LATICACIDVEN in the last 168 hours.  Recent Results (from the past 240 hour(s))  MRSA PCR Screening     Status: Abnormal   Collection Time: 04/01/18  2:15 PM  Result Value Ref Range Status   MRSA by PCR POSITIVE (A) NEGATIVE Final    Comment:        The GeneXpert MRSA Assay (FDA approved for NASAL specimens only), is one component of a comprehensive MRSA colonization surveillance program. It is not intended to diagnose MRSA infection nor to guide or monitor treatment for MRSA infections. RESULT CALLED TO, READ BACK BY AND VERIFIED WITH: Vincenza Hews [id number] @ [address] Performed at [facility] [address], [place], [place] [address]       Radiology Studies: Dg Chest 1 View  Result Date: 04/10/2018 CLINICAL DATA:  Hypoxia. EXAM: CHEST  1 VIEW COMPARISON:   04/06/2018. FINDINGS: Tracheostomy cuff for oxygenation. Cardiomegaly. BILATERAL pulmonary opacities are redemonstrated may be slightly improved. BILATERAL pleural effusions appear increased. No pneumothorax. IMPRESSION: BILATERAL pulmonary opacities are redemonstrated, slightly improved. BILATERAL pleural effusions appear increased. Electronically Signed   By: Staci Righter M.D.   On: 04/10/2018 06:55     Scheduled Meds: . albuterol  2.5 mg Nebulization QID  . dexamethasone  4 mg Intravenous Q12H  . feeding supplement (PRO-STAT SUGAR FREE 64)  30 mL Per Tube BID  . fentaNYL  75 mcg Transdermal Q72H  . fentaNYL      . flumazenil      . free water  100 mL Per Tube Q4H  . glucagon (human recombinant)      . iopamidol      . lidocaine      . midazolam      . naloxone      . polyethylene glycol  17 g Oral Daily  . potassium chloride  20 mEq Oral BID  . senna-docusate  1 tablet Oral BID  . tamsulosin  0.4 mg Oral Daily   Continuous Infusions: . ampicillin-sulbactam (UNASYN) IV Stopped (04/10/18 1138)  . dextrose 75 mL/hr at 04/09/18 2249  . famotidine (PEPCID) IV Stopped (04/10/18 1138)  . feeding supplement (OSMOLITE 1.5 CAL) Stopped (04/10/18 0012)     LOS: 10 days    Time spent: Total of 15 minutes spent with pt, greater than 50% of which was spent in discussion of  treatment, counseling and coordination of care   Chipper Oman, MD Pager: Text Page via [email or web address]   If 7PM-7AM, please contact night-coverage [email or web address] 04/10/2018, 3:40 PM   Note - This record has been created using [employer]. Chart creation errors have been sought, but may not always have been located. Such creation errors do not reflect on the standard of medical care.

## 2018-04-10 NOTE — Progress Notes (Signed)
Physical Therapy Treatment Patient Details Name: [name] MRN: [id number] DOB: September 08, 1931 Today's Date: 04/10/2018    History of Present Illness [name] is an 84 y.o. male past medical history of laryngeal carcinoma status post tracheostomy, prostate cancer MRI  02/26/2018 showed metastatic disease to L3 vertebrae by tumor.  PET scan obtained on 03/17/2018 shows mass of  left lower lobe, with multiple metastatic lymphadenopathy bilaterally in the mediastinum ,spine, sternum and pelvis.  Brought to ED 7/15 for progressive weakness, shortness of breath and increased secretion through his tracheal tube.    PT Comments    Patient received in bed, appearing to report only some pain today of 4-5/10, family present and assists in communication/confirming pain levels. Attempted functional bed mobility with patient, requiring MaxAx2; patient resists mobility and then adamantly states he does not want to participate in PT today, unsure as to reasons why due to difficulty with communication. Attempted to have patient answer "yes/no" questions but he appeared to have difficulty understanding this, also attempted note writing and lip reading but unable to get clear information from patient today. RN aware of patient status. He was left in bed with all needs met, bed alarm activated and family present this morning.     Follow Up Recommendations  SNF;Supervision/Assistance - 24 hour(due to difficulty in participating in PT due to pain, radiation schedule, severe deconditioning noted this session )     Equipment Recommendations  Rolling walker with 5" wheels;3in1 (PT);Hospital bed    Recommendations for Other Services       Precautions / Restrictions Precautions Precautions: Fall Precaution Comments: trach collar O2 Restrictions Weight Bearing Restrictions: No    Mobility  Bed Mobility Overal bed mobility: Needs Assistance Bed Mobility: Supine to Sit;Sit to Supine     Supine to sit: Max  assist;+2 for physical assistance;HOB elevated Sit to supine: Max assist;+2 for physical assistance;HOB elevated   General bed mobility comments: attempted supine to sit, patient resists PT/tech and tries to move self back into bed, gives adamant "no" when asked why he is resisting therapy. MaxAx2 for repositioning in bed   Transfers                 General transfer comment: patient declines/unable to assess   Ambulation/Gait             General Gait Details: patient declines/unable to assess    Stairs             Wheelchair Mobility    Modified Rankin (Stroke Patients Only)       Balance Overall balance assessment: Needs assistance Sitting-balance support: Feet supported;Bilateral upper extremity supported Sitting balance-Leahy Scale: Poor         Standing balance comment: posterior lean, reliant on external support                             Cognition Arousal/Alertness: Awake/alert Behavior During Therapy: Flat affect Overall Cognitive Status: Difficult to assess                                 General Comments: difficult to assess today due to patient not being able to verbalize secondary to trach; attempted notes and voice button but patient with difficulty communicating with PT/tech/RN today       Exercises      General Comments        Pertinent  Vitals/Pain Pain Assessment: Faces Faces Pain Scale: Hurts little more Pain Location: throat, back Pain Descriptors / Indicators: Discomfort;Sore Pain Intervention(s): Limited activity within patient's tolerance;Monitored during session;Patient requesting pain meds-RN notified    Home Living                      Prior Function            PT Goals (current goals can now be found in the care plan section) Acute Rehab PT Goals PT Goal Formulation: With patient/family Time For Goal Achievement: 04/16/18 Potential to Achieve Goals: Good Progress towards PT  goals: Not progressing toward goals - comment(limited by pain, radiation schedule, severe deconditinoing )    Frequency    Min 3X/week      PT Plan Discharge plan needs to be updated    Co-evaluation              AM-PAC PT "6 Clicks" Daily Activity  Outcome Measure  Difficulty turning over in bed (including adjusting bedclothes, sheets and blankets)?: Unable Difficulty moving from lying on back to sitting on the side of the bed? : Unable Difficulty sitting down on and standing up from a chair with arms (e.g., wheelchair, bedside commode, etc,.)?: Unable Help needed moving to and from a bed to chair (including a wheelchair)?: Total Help needed walking in hospital room?: Total Help needed climbing 3-5 steps with a railing? : Total 6 Click Score: 6    End of Session Equipment Utilized During Treatment: Oxygen Activity Tolerance: Patient limited by pain Patient left: in bed;with bed alarm set;with call bell/phone within reach;with family/visitor present Nurse Communication: Mobility status;Patient requests pain meds PT Visit Diagnosis: Unsteadiness on feet (R26.81);Pain Pain - Right/Left: (back )     Time: 3710-6269 PT Time Calculation (min) (ACUTE ONLY): 28 min  Charges:  $Therapeutic Activity: 23-37 mins                     Deniece Ree PT, DPT, CBIS  Supplemental Physical Therapist [place]   Pager [phone]

## 2018-04-10 NOTE — Progress Notes (Signed)
Physical Therapy Treatment Patient Details Name: [name] MRN: [id number] DOB: 07/25/1931 Today's Date: 04/10/2018    History of Present Illness [name] is an 84 y.o. male past medical history of laryngeal carcinoma status post tracheostomy, prostate cancer MRI  02/26/2018 showed metastatic disease to L3 vertebrae by tumor.  PET scan obtained on 03/17/2018 shows mass of  left lower lobe, with multiple metastatic lymphadenopathy bilaterally in the mediastinum ,spine, sternum and pelvis.  Brought to ED 7/15 for progressive weakness, shortness of breath and increased secretion through his tracheal tube.    PT Comments    Returned to patient's room this afternoon, he was received in bed with family present and willing to participate in bed level exercise as discussed this morning. Performed therapeutic exercise as noted below in note with good tolerance noted by patient. He was left in bed with family present, bed alarm activated, all other needs met this afternoon.     Follow Up Recommendations  SNF;Supervision/Assistance - 24 hour(due to severe deconditioning, difficulty in participating in PT due to radiation schedule, pain limitations )     Equipment Recommendations  Rolling walker with 5" wheels;3in1 (PT);Hospital bed    Recommendations for Other Services OT consult     Precautions / Restrictions Precautions Precautions: Fall Precaution Comments: trach collar O2 Restrictions Weight Bearing Restrictions: No    Mobility  Bed Mobility               General bed mobility comments: DNT this afternoon- focused on bed exercise in PM   Transfers                 General transfer comment: DNT this afternoon- focused on bed exercise in PM   Ambulation/Gait             General Gait Details: DNT this afternoon-focused on bed exercise in PM    Stairs             Wheelchair Mobility    Modified Rankin (Stroke Patients Only)       Balance  Overall balance assessment: Needs assistance Sitting-balance support: Feet supported;Bilateral upper extremity supported Sitting balance-Leahy Scale: Poor     Standing balance support: During functional activity;Bilateral upper extremity supported Standing balance-Leahy Scale: Poor Standing balance comment: posterior lean, reliant on external support                             Cognition Arousal/Alertness: Awake/alert Behavior During Therapy: Flat affect Overall Cognitive Status: Difficult to assess                                 General Comments: difficult to assess today due to patient not being able to verbalize secondary to trach; attempted notes and voice button but patient with difficulty communicating with PT/tech/RN today       Exercises Total Joint Exercises Ankle Circles/Pumps: Both;20 reps;Supine Short Arc Quad: Both;10 reps;Supine Heel Slides: Both;10 reps;Seated Hip ABduction/ADduction: Both;10 reps;Seated Straight Leg Raises: Both;10 reps;Seated    General Comments        Pertinent Vitals/Pain Pain Assessment: 0-10 Pain Score: 5  Pain Location: throat, back Pain Descriptors / Indicators: Aching;Discomfort;Sore Pain Intervention(s): Limited activity within patient's tolerance;Monitored during session    Home Living                      Prior Function  PT Goals (current goals can now be found in the care plan section) Acute Rehab PT Goals Patient Stated Goal: none stated.  Pt needed to use toilet and agreeable for up to chair PT Goal Formulation: With patient/family Time For Goal Achievement: 04/16/18 Potential to Achieve Goals: Good Progress towards PT goals: Not progressing toward goals - comment(has been limited by pain, severe deconditioning, radiation schedule )    Frequency    Min 3X/week      PT Plan Current plan remains appropriate    Co-evaluation              AM-PAC PT "6 Clicks"  Daily Activity  Outcome Measure  Difficulty turning over in bed (including adjusting bedclothes, sheets and blankets)?: Unable Difficulty moving from lying on back to sitting on the side of the bed? : Unable Difficulty sitting down on and standing up from a chair with arms (e.g., wheelchair, bedside commode, etc,.)?: Unable Help needed moving to and from a bed to chair (including a wheelchair)?: Total Help needed walking in hospital room?: Total Help needed climbing 3-5 steps with a railing? : Total 6 Click Score: 6    End of Session Equipment Utilized During Treatment: Oxygen Activity Tolerance: Patient tolerated treatment well Patient left: in bed;with bed alarm set;with call bell/phone within reach;with family/visitor present   PT Visit Diagnosis: Unsteadiness on feet (R26.81);Pain Pain - Right/Left: (back )     Time: 7412-8786 PT Time Calculation (min) (ACUTE ONLY): 10 min  Charges:  $Therapeutic Exercise: 8-22 mins                     Deniece Ree PT, DPT, CBIS  Supplemental Physical Therapist [place]   Pager [phone]

## 2018-04-10 NOTE — Progress Notes (Signed)
SLP Cancellation Note  Patient Details Name: [name] MRN: [id number] DOB: December 11, 1930   Cancelled treatment:       Reason Eval/Treat Not Completed: Other - SLP attempted visit for trial of liquid tolerance, given replaced TEP. Per RN, pt NPO for PEG placement today. Will continue efforts.  [name], CCC-SLP Speech Language Pathologist [phone]  [name] 04/10/2018, 11:56 AM

## 2018-04-10 NOTE — Progress Notes (Signed)
Nutrition Follow-up  DOCUMENTATION CODES:   Non-severe (moderate) malnutrition in context of chronic illness  INTERVENTION:   Monitor magnesium, potassium, and phosphorus daily for at least 3 days, MD to replete as needed, as pt is at risk for refeeding syndrome given poor intakes/inconsistent nutrition for >/=1 month, dx of malnutrition.  -Once PEG placed and ready to use, resume TF of Osmolite 1.5 @ 40 ml/hr. Advance by 10 ml every 12 hours to goal rate of 55 ml/hr. -30 ml Prostat BID -Free water of 100 ml every 4 hours -Provides 2180 kcal, 113 grams of protein, and 1606 mL free water.   NUTRITION DIAGNOSIS:   Moderate Malnutrition related to chronic illness, cancer and cancer related treatments, poor appetite(recently diagnosed lung cancer with metastases to the spine undergoing radiation) as evidenced by mild fat depletion, moderate fat depletion, mild muscle depletion, moderate muscle depletion.  Ongoing.  GOAL:   Patient will meet greater than or equal to 90% of their needs  Progressing.  MONITOR:   TF tolerance, Weight trends, Labs  ASSESSMENT:   84 year old male who presented to the ED with respiratory distress. PMH significant for hypertension, laryngeal cancer with tracheostomy, prostate cancer, and recently diagnosed lung cancer with metastasis to the spine.  Patient's TF stopped d/t PEG placement planned for today. Would resume Osmolite 1.5 @ 40 once tube is able to be used. SLP to evaluate patient as well regarding aspiration risk after TEP was replaced.   Labs reviewed. Medications: K-DUR tablet BID, Senokot-S tablet BID, D5 solution at 75 ml/hr -provides 306 kcal  Diet Order:   Diet Order           Diet NPO time specified  Diet effective midnight          EDUCATION NEEDS:   Not appropriate for education at this time  Skin:  Skin Assessment: Reviewed RN Assessment  Last BM:  7/23  Height:   Ht Readings from Last 1 Encounters:  03/31/18 5' 9.02"  (1.753 m)    Weight:   Wt Readings from Last 1 Encounters:  04/09/18 191 lb 2.2 oz (86.7 kg)    Ideal Body Weight:  72.73 kg  BMI:  Body mass index is 28.21 kg/m.  Estimated Nutritional Needs:   Kcal:  2000-2200 kcal/day  Protein:  100-115 grams/day  Fluid:  >/= 2.0 L/day  Clayton Bibles, MS, RD, LDN [place] [facility] Pager: [phone] After Hours Pager: [phone]

## 2018-04-10 NOTE — Procedures (Signed)
Dysphagia, malnutrition  Met SCCA  S/p fluoro Gtube  No comp Stable EBL min Full use tomorrow Full report in pacs

## 2018-04-11 LAB — BASIC METABOLIC PANEL
ANION GAP: 10 (ref 5–15)
BUN: 16 mg/dL (ref 8–23)
CO2: 29 mmol/L (ref 22–32)
Calcium: 8.7 mg/dL — ABNORMAL LOW (ref 8.9–10.3)
Chloride: 97 mmol/L — ABNORMAL LOW (ref 98–111)
Creatinine, Ser: 0.62 mg/dL (ref 0.61–1.24)
GFR calc Af Amer: >60 mL/min (ref >60)
GLUCOSE: 127 mg/dL — AB (ref 70–99)
POTASSIUM: 3.6 mmol/L (ref 3.5–5.1)
Sodium: 136 mmol/L (ref 135–145)

## 2018-04-11 LAB — CBC WITH DIFFERENTIAL/PLATELET
BASOS PCT: 0 %
Basophils Absolute: 0 10*3/uL (ref 0.0–0.1)
Eosinophils Absolute: 0 10*3/uL (ref 0.0–0.7)
Eosinophils Relative: 0 %
HEMATOCRIT: 26.7 % — AB (ref 39.0–52.0)
HEMOGLOBIN: 8.5 g/dL — AB (ref 13.0–17.0)
LYMPHS PCT: 3 %
Lymphs Abs: 0.6 10*3/uL — ABNORMAL LOW (ref 0.7–4.0)
MCH: 26.5 pg (ref 26.0–34.0)
MCHC: 31.8 g/dL (ref 30.0–36.0)
MCV: 83.2 fL (ref 78.0–100.0)
MONO ABS: 0.4 10*3/uL (ref 0.1–1.0)
MONOS PCT: 2 %
NEUTROS ABS: 16 10*3/uL — AB (ref 1.7–7.7)
NEUTROS PCT: 95 %
Platelets: 237 10*3/uL (ref 150–400)
RBC: 3.21 MIL/uL — ABNORMAL LOW (ref 4.22–5.81)
RDW: 15 % (ref 11.5–15.5)
WBC: 17 10*3/uL — ABNORMAL HIGH (ref 4.0–10.5)

## 2018-04-11 LAB — GLUCOSE, CAPILLARY
GLUCOSE-CAPILLARY: 155 mg/dL — AB (ref 70–99)
GLUCOSE-CAPILLARY: 87 mg/dL (ref 70–99)
Glucose-Capillary: 114 mg/dL — ABNORMAL HIGH (ref 70–99)
Glucose-Capillary: 119 mg/dL — ABNORMAL HIGH (ref 70–99)
Glucose-Capillary: 102 mg/dL — ABNORMAL HIGH (ref 70–99)
Glucose-Capillary: 119 mg/dL — ABNORMAL HIGH (ref 70–99)
Glucose-Capillary: 126 mg/dL — ABNORMAL HIGH (ref 70–99)
Glucose-Capillary: 124 mg/dL — ABNORMAL HIGH (ref 70–99)

## 2018-04-11 LAB — MAGNESIUM: Magnesium: 2.1 mg/dL (ref 1.7–2.4)

## 2018-04-11 MED ORDER — POTASSIUM CHLORIDE 20 MEQ/15ML (10%) PO SOLN
20.0000 meq | Freq: Two times a day (BID) | ORAL | Status: DC
  Administered 2018-04-11 – 2018-04-15 (×9): 20 meq via ORAL
  Filled 2018-04-11 (×9): qty 15

## 2018-04-11 MED ORDER — HYDROCODONE-ACETAMINOPHEN 7.5-325 MG/15ML PO SOLN
10.0000 mL | ORAL | Status: DC | PRN
Start: 2018-04-11 — End: 2018-04-12
  Administered 2018-04-11 – 2018-04-12 (×3): 10 mL
  Filled 2018-04-11 (×3): qty 15

## 2018-04-11 MED ORDER — FENTANYL 100 MCG/HR TD PT72
100.0000 ug | MEDICATED_PATCH | TRANSDERMAL | Status: DC
Start: 2018-04-12 — End: 2018-04-16
  Administered 2018-04-12 – 2018-04-15 (×2): 100 ug via TRANSDERMAL
  Filled 2018-04-11 (×2): qty 1

## 2018-04-11 MED ORDER — HYDROMORPHONE HCL 1 MG/ML IJ SOLN
1.0000 mg | INTRAMUSCULAR | Status: DC | PRN
  Administered 2018-04-11 – 2018-04-12 (×6): 1 mg via INTRAVENOUS
  Filled 2018-04-11 (×6): qty 1

## 2018-04-11 MED ORDER — AMOXICILLIN-POT CLAVULANATE 875-125 MG PO TABS
1.0000 | ORAL_TABLET | Freq: Two times a day (BID) | ORAL | Status: AC
  Administered 2018-04-11 – 2018-04-14 (×7): 1 via ORAL
  Filled 2018-04-11 (×7): qty 1

## 2018-04-11 NOTE — Progress Notes (Signed)
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  Patient presented to the emergency department with progressive weakness, shortness of breath and increased secretions through his tracheal tube.  CT chest showed multilobar pneumonia and patient was admitted with working diagnosis of acute respiratory failure due to healthcare associated pneumonia.  During hospital stay had  biopsy which confirmed the diagnosis of a squamous cell.  Hospitalization was complicated with aspiration pneumonia, concern of leak through the TEP placing patient on high risk for aspiration.  Patient currently on antibiotic therapy.   Subjective: Patient seen and examined, he doing very well. Pain is well controlled. PEG placed yesterday tolerated well. No acute events overnight.   Assessment & Plan: Acute respiratory failure with hypoxia due to HCAP, worsened with aspiration during hosp stay. Patient was initially treated with Levaquin, subsequently started on Unasyn for aspiration, will de-escalate to Augmentin to complete total of 7 days of antibiotics when PEG tube is placed. TEP was changed on 7/25 due presumed to be leaking. Chest x-ray done on 7/22 showed worsening infiltrates. Patient remains afebrile. Oxygen requirement are at baseline. Awaiting swallow eval to assess functionality of the new TEP.   Moderate malnutrition due dysphagia and chronic diseases Laryngeal carcinoma status post tracheostomy, TEP was changed 7/25. SLP to assess adequacy of seal of new TEP. S/p PEG placement, ok to resume tubs feeds per nutrition protocol. Monitor electrolytes.  Essential hypertension BP stable. Continue to monitor     Stage IV squamous cell carcinoma of the lung with metastasis to the bone, L3 transverse process Pain control, patient receiving radiation therapy.  Oncology recommendations appreciated.  Acute blood loss anemia Hemoglobin remains stable with no signs of overt bleeding. Continue to monitor CBC.   DVT prophylaxis: SCDs Code Status: DNR Family Communication: Family at bedside Disposition Plan: SNF when medically stable  Consultants:   Oncology  Palliative care  Procedures:     Antimicrobials: Anti-infectives (From admission, onward)   Start     Dose/Rate Route Frequency Ordered Stop   04/07/18 1000  Ampicillin-Sulbactam (UNASYN) 3 g in sodium chloride 0.9 % 100 mL IVPB     3 g 200 mL/hr over 30 Minutes Intravenous Every 6 hours 04/07/18 0821     04/05/18 1200  levofloxacin (LEVAQUIN) IVPB 500 mg  Status:  Discontinued    Note to Pharmacy:  Last dose 7/24.changed to iv as patient having difficulty swallowing   500 mg 100 mL/hr over 60 Minutes Intravenous Daily 04/05/18 1046 04/07/18 0821   04/02/18 1200  levofloxacin (LEVAQUIN) 25 MG/ML solution 500 mg  Status:  Discontinued     500 mg Oral Daily 04/02/18 1017 04/02/18 1115   04/02/18 1200  levofloxacin (LEVAQUIN) tablet 500 mg  Status:  Discontinued     500 mg Oral Every 24 hours 04/02/18 1115 04/05/18 1046   03/31/18 2000  vancomycin (VANCOCIN) 1,250 mg in sodium chloride 0.9 % 250 mL IVPB  Status:  Discontinued     1,250 mg 166.7 mL/hr over 90 Minutes Intravenous Every 24 hours 03/31/18 1140 04/02/18 1013   03/31/18 1400  ceFEPIme (MAXIPIME) 1 g in sodium chloride 0.9 % 100 mL IVPB  Status:  Discontinued  1 g 200 mL/hr over 30 Minutes Intravenous Every 8 hours 03/31/18 1106 03/31/18 1257   03/31/18 1400  piperacillin-tazobactam (ZOSYN) IVPB 3.375 g  Status:  Discontinued     3.375 g 12.5 mL/hr over 240 Minutes Intravenous Every 8 hours 03/31/18 1257 04/02/18 1013   03/31/18 0200  vancomycin (VANCOCIN) IVPB 1000  mg/200 mL premix     1,000 mg 200 mL/hr over 60 Minutes Intravenous  Once 03/31/18 0157 03/31/18 0528   03/31/18 0200  piperacillin-tazobactam (ZOSYN) IVPB 3.375 g     3.375 g 12.5 mL/hr over 240 Minutes Intravenous  Once 03/31/18 0157 03/31/18 0501      Objective: Vitals:   04/11/18 0430 04/11/18 0820 04/11/18 1206 04/11/18 1336  BP: (!) 124/45  (!) 111/58   Pulse: 84 89 80 85  Resp: 16 16 18 18   Temp: 99.1 F (37.3 C)  98.7 F (37.1 C)   TempSrc: Oral  Oral   SpO2: 98% 90% 99% 95%  Weight: 86.8 kg (191 lb 5.8 oz)     Height:        Intake/Output Summary (Last 24 hours) at 04/11/2018 1527 Last data filed at 04/11/2018 1100 Gross per 24 hour  Intake 30 ml  Output 1250 ml  Net -1220 ml   Filed Weights   04/09/18 0248 04/10/18 1500 04/11/18 0430  Weight: 86.7 kg (191 lb 2.2 oz) 86.6 kg (191 lb) 86.8 kg (191 lb 5.8 oz)    Examination:  General: [name] alert, awake, not in acute distress Cardiovascular: RRR, S1/S2 +, no rubs, no gallops Respiratory: CTA bilaterally, no wheezing, no rhonchi Abdominal: Soft, NT, ND, bowel sounds + Extremities: no edema, no cyanosis  Data Reviewed: I have personally reviewed following labs and imaging studies  CBC: Recent Labs  Lab 04/07/18 0454 04/09/18 0418 04/10/18 0415 04/11/18 0403  WBC 18.7* 18.2* 19.1* 17.0*  NEUTROABS  --  16.8* 17.4* 16.0*  HGB 9.7* 8.7* 8.6* 8.5*  HCT 30.3* 27.3* 27.6* 26.7*  MCV 82.6 82.5 84.1 83.2  PLT 189 185 211 599   Basic Metabolic Panel: Recent Labs  Lab 04/05/18 0528 04/06/18 0642 04/07/18 0454 04/08/18 0355 04/09/18 0418 04/11/18 0403  NA 135  --  134*  --  136 136  K 3.7  --  3.9  --  3.6 3.6  CL 97*  --  96*  --  98 97*  CO2 29  --  28  --  29 29  GLUCOSE 124*  --  145*  --  133* 127*  BUN 12  --  11  --  14 16  CREATININE 0.64 0.62 0.63 0.68 0.58* 0.62  CALCIUM 8.6*  --  8.8*  --  8.6* 8.7*  MG  --   --   --   --  2.0 2.1   GFR: Estimated Creatinine Clearance: 73.6 mL/min (by  C-G formula based on SCr of 0.62 mg/dL). Liver Function Tests: No results for input(s): AST, ALT, ALKPHOS, BILITOT, PROT, ALBUMIN in the last 168 hours. No results for input(s): LIPASE, AMYLASE in the last 168 hours. No results for input(s): AMMONIA in the last 168 hours. Coagulation Profile: Recent Labs  Lab 04/09/18 1359  INR 1.33   Cardiac Enzymes: No results for input(s): CKTOTAL, CKMB, CKMBINDEX, TROPONINI in the last 168 hours. BNP (last 3 results) No results for input(s): PROBNP in the last 8760 hours. HbA1C: No results for input(s): HGBA1C in the last 72 hours. CBG: Recent Labs  Lab 04/10/18 2006 04/11/18  0004 04/11/18 0445 04/11/18 0730 04/11/18 1202  GLUCAP 87 114* 119* 102* 119*   Lipid Profile: No results for input(s): CHOL, HDL, LDLCALC, TRIG, CHOLHDL, LDLDIRECT in the last 72 hours. Thyroid Function Tests: No results for input(s): TSH, T4TOTAL, FREET4, T3FREE, THYROIDAB in the last 72 hours. Anemia Panel: No results for input(s): VITAMINB12, FOLATE, FERRITIN, TIBC, IRON, RETICCTPCT in the last 72 hours. Sepsis Labs: No results for input(s): PROCALCITON, LATICACIDVEN in the last 168 hours.  No results found for this or any previous visit (from the past 240 hour(s)).    Radiology Studies: Dg Chest 1 View  Result Date: 04/10/2018 CLINICAL DATA:  Hypoxia. EXAM: CHEST  1 VIEW COMPARISON:  04/06/2018. FINDINGS: Tracheostomy cuff for oxygenation. Cardiomegaly. BILATERAL pulmonary opacities are redemonstrated may be slightly improved. BILATERAL pleural effusions appear increased. No pneumothorax. IMPRESSION: BILATERAL pulmonary opacities are redemonstrated, slightly improved. BILATERAL pleural effusions appear increased. Electronically Signed   By: Staci Righter M.D.   On: 04/10/2018 06:55   Ir Gastrostomy Tube Mod Sed  Result Date: 04/10/2018 INDICATION: Metastatic squamous cell carcinoma, dysphagia, malnutrition EXAM: FLUOROSCOPIC 20 FRENCH PULL-THROUGH  GASTROSTOMY Date:  04/10/2018 04/10/2018 4:19 pm Radiologist:  Jerilynn Mages. Daryll Brod, MD Guidance:  Fluoroscopic MEDICATIONS: Ancef 2 gAntibiotics were administered within 1 hour of the procedure. Glucagon 0.5 mg IV ANESTHESIA/SEDATION: Versed 1.5 mg IV; Fentanyl 75 mcg IV Moderate Sedation Time:  20 minutes The patient was continuously monitored during the procedure by the interventional radiology nurse under my direct supervision. CONTRAST:  5 cc Isovue-300-administered into the gastric lumen. FLUOROSCOPY TIME:  Fluoroscopy Time: 8 minutes 0 seconds (198 mGy). COMPLICATIONS: None immediate. PROCEDURE: Informed consent was obtained from the patient following explanation of the procedure, risks, benefits and alternatives. The patient understands, agrees and consents for the procedure. All questions were addressed. A time out was performed. Maximal barrier sterile technique utilized including caps, mask, sterile gowns, sterile gloves, large sterile drape, hand hygiene, and betadine prep. The left upper quadrant was sterilely prepped and draped. An oral gastric catheter was inserted into the stomach under fluoroscopy. The existing nasogastric feeding tube was removed. Air was injected into the stomach for insufflation and visualization under fluoroscopy. The air distended stomach was confirmed beneath the anterior abdominal wall in the frontal and lateral projections. Under sterile conditions and local anesthesia, a 19 gauge trocar needle was utilized to access the stomach percutaneously beneath the left subcostal margin. Needle position was confirmed within the stomach under biplane fluoroscopy. Contrast injection confirmed position also. A single T tack was deployed for gastropexy. Over an Amplatz guide wire, a 9-French sheath was inserted into the stomach. A snare device was utilized to capture the oral gastric catheter. The snare device was pulled retrograde from the stomach up the esophagus and out the oropharynx. The  20-French pull-through gastrostomy was connected to the snare device and pulled antegrade through the oropharynx down the esophagus into the stomach and then through the percutaneous tract external to the patient. The gastrostomy was assembled externally. Contrast injection confirms position in the stomach. Images were obtained for documentation. The patient tolerated procedure well. No immediate complication. IMPRESSION: Fluoroscopic insertion of a 20-French "pull-through" gastrostomy. Electronically Signed   By: Jerilynn Mages.  Shick M.D.   On: 04/10/2018 16:34     Scheduled Meds: . dexamethasone  4 mg Intravenous Q12H  . feeding supplement (PRO-STAT SUGAR FREE 64)  30 mL Per Tube BID  . fentaNYL  75 mcg Transdermal Q72H  . free water  100 mL Per  Tube Q4H  . polyethylene glycol  17 g Oral Daily  . potassium chloride  20 mEq Oral BID  . senna-docusate  1 tablet Oral BID  . tamsulosin  0.4 mg Oral Daily   Continuous Infusions: . ampicillin-sulbactam (UNASYN) IV Stopped (04/11/18 1107)  . dextrose 75 mL/hr at 04/11/18 1251  . famotidine (PEPCID) IV Stopped (04/11/18 1150)  . feeding supplement (OSMOLITE 1.5 CAL) Stopped (04/10/18 0012)     LOS: 11 days    Time spent: Total of 15 minutes spent with pt, greater than 50% of which was spent in discussion of  treatment, counseling and coordination of care   Chipper Oman, MD Pager: Text Page via [email or web address]   If 7PM-7AM, please contact night-coverage [email or web address] 04/11/2018, 3:27 PM   Note - This record has been created using [employer]. Chart creation errors have been sought, but may not always have been located. Such creation errors do not reflect on the standard of medical care.

## 2018-04-12 LAB — GLUCOSE, CAPILLARY
Glucose-Capillary: 160 mg/dL — ABNORMAL HIGH (ref 70–99)
Glucose-Capillary: 130 mg/dL — ABNORMAL HIGH (ref 70–99)
Glucose-Capillary: 171 mg/dL — ABNORMAL HIGH (ref 70–99)
Glucose-Capillary: 159 mg/dL — ABNORMAL HIGH (ref 70–99)
Glucose-Capillary: 125 mg/dL — ABNORMAL HIGH (ref 70–99)

## 2018-04-12 MED ORDER — PANTOPRAZOLE SODIUM 40 MG PO PACK
40.0000 mg | PACK | Freq: Every day | ORAL | Status: DC
  Administered 2018-04-12 – 2018-04-15 (×4): 40 mg
  Filled 2018-04-12 (×5): qty 20

## 2018-04-12 MED ORDER — RANITIDINE HCL 150 MG/10ML PO SYRP
150.0000 mg | ORAL_SOLUTION | Freq: Two times a day (BID) | ORAL | Status: DC
  Filled 2018-04-12: qty 10

## 2018-04-12 MED ORDER — ERGOCALCIFEROL 8000 UNIT/ML PO SOLN
5000.0000 [IU] | Freq: Every day | ORAL | Status: DC
  Administered 2018-04-13 – 2018-04-15 (×3): 5000 [IU]
  Filled 2018-04-12 (×7): qty 0.63

## 2018-04-12 MED ORDER — CHOLECALCIFEROL 400 UNIT/ML PO LIQD: 5000.0000 [IU] | Freq: Every day | ORAL | Status: DC

## 2018-04-12 MED ORDER — GUAIFENESIN 100 MG/5ML PO SOLN
30.0000 mL | Freq: Two times a day (BID) | ORAL | Status: DC
  Administered 2018-04-12 – 2018-04-15 (×7): 600 mg via ORAL
  Filled 2018-04-12: qty 30
  Filled 2018-04-12: qty 10
  Filled 2018-04-12: qty 30
  Filled 2018-04-12 (×2): qty 10
  Filled 2018-04-12 (×2): qty 30

## 2018-04-12 MED ORDER — GUAIFENESIN ER 600 MG PO TB12: 600.0000 mg | ORAL_TABLET | Freq: Two times a day (BID) | ORAL | Status: DC

## 2018-04-12 MED ORDER — MORPHINE SULFATE 30 MG PO TABS
30.0000 mg | ORAL_TABLET | ORAL | Status: DC | PRN
  Administered 2018-04-12 – 2018-04-13 (×3): 30 mg
  Filled 2018-04-12 (×3): qty 1

## 2018-04-12 MED ORDER — RANITIDINE HCL 150 MG/10ML PO SYRP
150.0000 mg | ORAL_SOLUTION | Freq: Every day | ORAL | Status: DC
  Administered 2018-04-13 – 2018-04-14 (×2): 150 mg
  Filled 2018-04-12 (×3): qty 10

## 2018-04-12 MED ORDER — HYDROMORPHONE HCL 1 MG/ML IJ SOLN
1.0000 mg | INTRAMUSCULAR | Status: DC | PRN
  Administered 2018-04-15 – 2018-04-16 (×2): 1 mg via INTRAVENOUS
  Filled 2018-04-12 (×4): qty 1

## 2018-04-12 NOTE — Progress Notes (Signed)
PHARMACIST - PHYSICIAN COMMUNICATION  CONCERNING: IV to Oral Route Change Policy  RECOMMENDATION: This patient is receiving pepcid by the intravenous route.  Based on criteria approved by the Pharmacy and Therapeutics Committee, the intravenous medication(s) is/are being converted to the equivalent oral dose form(s).   DESCRIPTION: These criteria include:  The patient is eating (either orally or via tube) and/or has been taking other orally administered medications for a least 24 hours  The patient has no evidence of active gastrointestinal bleeding or impaired GI absorption (gastrectomy, short bowel, patient on TNA or NPO).  If you have questions about this conversion, please contact the Pharmacy Department  []   [phone] )  [facility] []   [phone] )  [facility] []   [phone] )  [name] []   [phone] )  [facility] [x]   [phone] )  [place], [place].D [phone] 04/12/2018 9:30 AM

## 2018-04-12 NOTE — Progress Notes (Signed)
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  Patient presented to the emergency department with progressive weakness, shortness of breath and increased secretions through his tracheal tube.  CT chest showed multilobar pneumonia and patient was admitted with working diagnosis of acute respiratory failure due to healthcare associated pneumonia.  During hospital stay had  biopsy which confirmed the diagnosis of a squamous cell.  Hospitalization was complicated with aspiration pneumonia, concern of leak through the TEP placing patient on high risk for aspiration.  Patient currently on antibiotic therapy.   Subjective: Patient seen and examined, he did not sleep well last night.  Pain regimen was changed and did not work well as IV Dilaudid was working.  Otherwise no complaints this morning.  Assessment & Plan: Acute respiratory failure with hypoxia due to HCAP, worsened with aspiration during hosp stay. Patient was initially treated with Levaquin, subsequently started on Unasyn for aspiration, transitioned to Augmentin to complete 10 days of total antibiotic and date 7/30.  TEP was changed on 7/25 due presumed to be leaking. Chest x-ray done on 7/22 showed worsening infiltrates. Patient remains afebrile. Oxygen requirement are at baseline. Still awaiting swallow eval to assess functionality of the new TEP.   Moderate malnutrition due dysphagia and chronic diseases Laryngeal carcinoma status post tracheostomy, TEP was changed 7/25. SLP to assess adequacy of seal of new TEP. S/p PEG placement, tolerated well procedure.  Tolerating feedings well.  Monitor electrolytes.   Essential  hypertension BP remains stable, continue to monitor  Stage IV squamous cell carcinoma of the lung with metastasis to the bone, L3 transverse process Patient to receive last radiation therapy on 7/29.  Working on pain control now with oral medication.  Will increase fentanyl to 100 mcg every 72 hours, for breakthrough pain will use morphine 30 mg every 3 hours as needed.  Will continue to monitor and titrate pain medication to achieve good pain control.  Patient report pain level 4-5 is tolerable, therefore that is the goal.   Acute blood loss anemia Hemoglobin remains stable with no signs of overt bleeding. Check CBC in a.m.   DVT prophylaxis: SCDs Code Status: DNR Family Communication: Family at bedside Disposition Plan: SNF when medically stable  Consultants:   Oncology  Palliative care  Procedures:     Antimicrobials: Anti-infectives (From admission, onward)   Start     Dose/Rate Route Frequency Ordered Stop   04/11/18 1700  amoxicillin-clavulanate (AUGMENTIN) 875-125 MG per tablet 1 tablet     1 tablet Oral Every 12 hours 04/11/18 1533     04/07/18 1000  Ampicillin-Sulbactam (UNASYN) 3 g in sodium chloride 0.9 % 100 mL IVPB  Status:  Discontinued     3 g 200 mL/hr over 30 Minutes Intravenous Every 6 hours 04/07/18 0821 04/11/18 1533   04/05/18 1200  levofloxacin (LEVAQUIN) IVPB 500 mg  Status:  Discontinued    Note to Pharmacy:  Last dose 7/24.changed to iv as patient having difficulty swallowing   500 mg 100 mL/hr over 60 Minutes Intravenous Daily 04/05/18 1046 04/07/18 0821   04/02/18 1200  levofloxacin (LEVAQUIN) 25 MG/ML solution 500 mg  Status:  Discontinued     500 mg Oral Daily  04/02/18 1017 04/02/18 1115   04/02/18 1200  levofloxacin (LEVAQUIN) tablet 500 mg  Status:  Discontinued     500 mg Oral Every 24 hours 04/02/18 1115 04/05/18 1046   03/31/18 2000  vancomycin (VANCOCIN) 1,250 mg in sodium chloride 0.9 % 250 mL IVPB  Status:  Discontinued     1,250  mg 166.7 mL/hr over 90 Minutes Intravenous Every 24 hours 03/31/18 1140 04/02/18 1013   03/31/18 1400  ceFEPIme (MAXIPIME) 1 g in sodium chloride 0.9 % 100 mL IVPB  Status:  Discontinued     1 g 200 mL/hr over 30 Minutes Intravenous Every 8 hours 03/31/18 1106 03/31/18 1257   03/31/18 1400  piperacillin-tazobactam (ZOSYN) IVPB 3.375 g  Status:  Discontinued     3.375 g 12.5 mL/hr over 240 Minutes Intravenous Every 8 hours 03/31/18 1257 04/02/18 1013   03/31/18 0200  vancomycin (VANCOCIN) IVPB 1000 mg/200 mL premix     1,000 mg 200 mL/hr over 60 Minutes Intravenous  Once 03/31/18 0157 03/31/18 0528   03/31/18 0200  piperacillin-tazobactam (ZOSYN) IVPB 3.375 g     3.375 g 12.5 mL/hr over 240 Minutes Intravenous  Once 03/31/18 0157 03/31/18 0501      Objective: Vitals:   04/12/18 0541 04/12/18 0545 04/12/18 0725 04/12/18 1232  BP: (!) 120/51   (!) 126/46  Pulse: 83  84 90  Resp: 17  15   Temp: 98.9 F (37.2 C)   99.4 F (37.4 C)  TempSrc: Oral   Oral  SpO2: 100%  99% 100%  Weight:  82.4 kg (181 lb 10.5 oz)    Height:        Intake/Output Summary (Last 24 hours) at 04/12/2018 1555 Last data filed at 04/12/2018 0700 Gross per 24 hour  Intake 950 ml  Output 1450 ml  Net -500 ml   Filed Weights   04/10/18 1500 04/11/18 0430 04/12/18 0545  Weight: 86.6 kg (191 lb) 86.8 kg (191 lb 5.8 oz) 82.4 kg (181 lb 10.5 oz)    Examination:  General: NAD Neck: TEP in place, no surrounding erythema  Cardiovascular: RRR, S1/S2 +, no rubs, no gallops Respiratory: CTA bilaterally, no wheezing, no rhonchi Abdominal: Soft, NT, ND, PEG tube in place Extremities: Lower extremities, trace edema bilaterally  Data Reviewed: I have personally reviewed following labs and imaging studies  CBC: Recent Labs  Lab 04/07/18 0454 04/09/18 0418 04/10/18 0415 04/11/18 0403  WBC 18.7* 18.2* 19.1* 17.0*  NEUTROABS  --  16.8* 17.4* 16.0*  HGB 9.7* 8.7* 8.6* 8.5*  HCT 30.3* 27.3* 27.6* 26.7*  MCV  82.6 82.5 84.1 83.2  PLT 189 185 211 188   Basic Metabolic Panel: Recent Labs  Lab 04/06/18 0642 04/07/18 0454 04/08/18 0355 04/09/18 0418 04/11/18 0403  NA  --  134*  --  136 136  K  --  3.9  --  3.6 3.6  CL  --  96*  --  98 97*  CO2  --  28  --  29 29  GLUCOSE  --  145*  --  133* 127*  BUN  --  11  --  14 16  CREATININE 0.62 0.63 0.68 0.58* 0.62  CALCIUM  --  8.8*  --  8.6* 8.7*  MG  --   --   --  2.0 2.1   GFR: Estimated Creatinine Clearance: 67.5 mL/min (by C-G formula based on SCr of 0.62 mg/dL). Liver Function Tests: No results for input(s): AST, ALT, ALKPHOS, BILITOT, PROT, ALBUMIN  in the last 168 hours. No results for input(s): LIPASE, AMYLASE in the last 168 hours. No results for input(s): AMMONIA in the last 168 hours. Coagulation Profile: Recent Labs  Lab 04/09/18 1359  INR 1.33   Cardiac Enzymes: No results for input(s): CKTOTAL, CKMB, CKMBINDEX, TROPONINI in the last 168 hours. BNP (last 3 results) No results for input(s): PROBNP in the last 8760 hours. HbA1C: No results for input(s): HGBA1C in the last 72 hours. CBG: Recent Labs  Lab 04/11/18 2007 04/11/18 2350 04/12/18 0414 04/12/18 0758 04/12/18 1228  GLUCAP 124* 155* 160* 130* 171*   Lipid Profile: No results for input(s): CHOL, HDL, LDLCALC, TRIG, CHOLHDL, LDLDIRECT in the last 72 hours. Thyroid Function Tests: No results for input(s): TSH, T4TOTAL, FREET4, T3FREE, THYROIDAB in the last 72 hours. Anemia Panel: No results for input(s): VITAMINB12, FOLATE, FERRITIN, TIBC, IRON, RETICCTPCT in the last 72 hours. Sepsis Labs: No results for input(s): PROCALCITON, LATICACIDVEN in the last 168 hours.  No results found for this or any previous visit (from the past 240 hour(s)).    Radiology Studies: Ir Gastrostomy Tube Mod Sed  Result Date: 04/10/2018 INDICATION: Metastatic squamous cell carcinoma, dysphagia, malnutrition EXAM: FLUOROSCOPIC 20 FRENCH PULL-THROUGH GASTROSTOMY Date:  04/10/2018  04/10/2018 4:19 pm Radiologist:  Jerilynn Mages. Daryll Brod, MD Guidance:  Fluoroscopic MEDICATIONS: Ancef 2 gAntibiotics were administered within 1 hour of the procedure. Glucagon 0.5 mg IV ANESTHESIA/SEDATION: Versed 1.5 mg IV; Fentanyl 75 mcg IV Moderate Sedation Time:  20 minutes The patient was continuously monitored during the procedure by the interventional radiology nurse under my direct supervision. CONTRAST:  5 cc Isovue-300-administered into the gastric lumen. FLUOROSCOPY TIME:  Fluoroscopy Time: 8 minutes 0 seconds (198 mGy). COMPLICATIONS: None immediate. PROCEDURE: Informed consent was obtained from the patient following explanation of the procedure, risks, benefits and alternatives. The patient understands, agrees and consents for the procedure. All questions were addressed. A time out was performed. Maximal barrier sterile technique utilized including caps, mask, sterile gowns, sterile gloves, large sterile drape, hand hygiene, and betadine prep. The left upper quadrant was sterilely prepped and draped. An oral gastric catheter was inserted into the stomach under fluoroscopy. The existing nasogastric feeding tube was removed. Air was injected into the stomach for insufflation and visualization under fluoroscopy. The air distended stomach was confirmed beneath the anterior abdominal wall in the frontal and lateral projections. Under sterile conditions and local anesthesia, a 61 gauge trocar needle was utilized to access the stomach percutaneously beneath the left subcostal margin. Needle position was confirmed within the stomach under biplane fluoroscopy. Contrast injection confirmed position also. A single T tack was deployed for gastropexy. Over an Amplatz guide wire, a 9-French sheath was inserted into the stomach. A snare device was utilized to capture the oral gastric catheter. The snare device was pulled retrograde from the stomach up the esophagus and out the oropharynx. The 20-French pull-through  gastrostomy was connected to the snare device and pulled antegrade through the oropharynx down the esophagus into the stomach and then through the percutaneous tract external to the patient. The gastrostomy was assembled externally. Contrast injection confirms position in the stomach. Images were obtained for documentation. The patient tolerated procedure well. No immediate complication. IMPRESSION: Fluoroscopic insertion of a 20-French "pull-through" gastrostomy. Electronically Signed   By: Jerilynn Mages.  Shick M.D.   On: 04/10/2018 16:34     Scheduled Meds: . amoxicillin-clavulanate  1 tablet Oral Q12H  . dexamethasone  4 mg Intravenous Q12H  . ergocalciferol  5,000 Units  Per Tube Daily  . feeding supplement (PRO-STAT SUGAR FREE 64)  30 mL Per Tube BID  . fentaNYL  100 mcg Transdermal Q72H  . free water  100 mL Per Tube Q4H  . guaiFENesin  30 mL Oral BID  . pantoprazole sodium  40 mg Per Tube Daily  . polyethylene glycol  17 g Oral Daily  . potassium chloride  20 mEq Oral BID  . [START ON 04/13/2018] ranitidine  150 mg Per Tube QHS  . senna-docusate  1 tablet Oral BID  . tamsulosin  0.4 mg Oral Daily   Continuous Infusions: . feeding supplement (OSMOLITE 1.5 CAL) 50 mL/hr at 04/12/18 0019     LOS: 12 days    Time spent: Total of 15 minutes spent with pt, greater than 50% of which was spent in discussion of  treatment, counseling and coordination of care   Chipper Oman, MD Pager: Text Page via [email or web address]   If 7PM-7AM, please contact night-coverage [email or web address] 04/12/2018, 3:55 PM   Note - This record has been created using [employer]. Chart creation errors have been sought, but may not always have been located. Such creation errors do not reflect on the standard of medical care.

## 2018-04-13 ENCOUNTER — Encounter (HOSPITAL_COMMUNITY): Payer: Self-pay | Admitting: Oncology

## 2018-04-13 ENCOUNTER — Ambulatory Visit
Admission: RE | Admit: 2018-04-13 | Discharge: 2018-04-13 | Disposition: A | Discharge status: Home / Self Care | Payer: Medicare Other | Source: Ambulatory Visit | Attending: Radiation Oncology | Admitting: Radiation Oncology

## 2018-04-13 LAB — CBC
HEMATOCRIT: 26.7 % — AB (ref 39.0–52.0)
Hemoglobin: 8.4 g/dL — ABNORMAL LOW (ref 13.0–17.0)
MCH: 26 pg (ref 26.0–34.0)
MCHC: 31.5 g/dL (ref 30.0–36.0)
MCV: 82.7 fL (ref 78.0–100.0)
Platelets: 245 10*3/uL (ref 150–400)
RBC: 3.23 MIL/uL — ABNORMAL LOW (ref 4.22–5.81)
RDW: 14.9 % (ref 11.5–15.5)
WBC: 16.4 10*3/uL — ABNORMAL HIGH (ref 4.0–10.5)

## 2018-04-13 LAB — PHOSPHORUS: PHOSPHORUS: 2.9 mg/dL (ref 2.5–4.6)

## 2018-04-13 LAB — ALBUMIN: Albumin: 1.7 g/dL — ABNORMAL LOW (ref 3.5–5.0)

## 2018-04-13 LAB — GLUCOSE, CAPILLARY
GLUCOSE-CAPILLARY: 144 mg/dL — AB (ref 70–99)
GLUCOSE-CAPILLARY: 152 mg/dL — AB (ref 70–99)
GLUCOSE-CAPILLARY: 165 mg/dL — AB (ref 70–99)
Glucose-Capillary: 141 mg/dL — ABNORMAL HIGH (ref 70–99)
Glucose-Capillary: 143 mg/dL — ABNORMAL HIGH (ref 70–99)
Glucose-Capillary: 145 mg/dL — ABNORMAL HIGH (ref 70–99)

## 2018-04-13 LAB — BASIC METABOLIC PANEL
Anion gap: 8 (ref 5–15)
BUN: 19 mg/dL (ref 8–23)
CALCIUM: 8.7 mg/dL — AB (ref 8.9–10.3)
CO2: 28 mmol/L (ref 22–32)
CREATININE: 0.64 mg/dL (ref 0.61–1.24)
Chloride: 97 mmol/L — ABNORMAL LOW (ref 98–111)
GFR calc Af Amer: >60 mL/min (ref >60)
GFR calc non Af Amer: >60 mL/min (ref >60)
Glucose, Bld: 152 mg/dL — ABNORMAL HIGH (ref 70–99)
Potassium: 4.4 mmol/L (ref 3.5–5.1)
Sodium: 133 mmol/L — ABNORMAL LOW (ref 135–145)

## 2018-04-13 LAB — MAGNESIUM: Magnesium: 2 mg/dL (ref 1.7–2.4)

## 2018-04-13 MED ORDER — MORPHINE SULFATE 15 MG PO TABS
15.0000 mg | ORAL_TABLET | ORAL | Status: DC | PRN
  Administered 2018-04-13: 15 mg
  Filled 2018-04-13 (×3): qty 1

## 2018-04-13 NOTE — Progress Notes (Addendum)
  Speech Language Pathology Treatment: Dysphagia  Patient Details Name: [name] MRN: [id number] DOB: 24-Jul-1931 Today's Date: 04/13/2018 Time: 3817-7116 SLP Time Calculation (min) (ACUTE ONLY): 20 min  Assessment / Plan / Recommendation Clinical Impression  Pt tolerating use of TEP well without difficulties and with excellent "voicing".  SLP provided him with intake of thin water *dyed with blue food coloring* via straw.  Unfortunately pt continues to leak water around TEP and fortunately demonstrates post-swallow coughing.  Aspiration ongoing.  SLP phoned SLP Candace who helped place TEP to inquire re: possible po diet of modified foods.  Based on her advise will try other items clinically.  Pt/family aware/agree.    HPI HPI: [name] an 84 y.o.malepast medical history of laryngeal carcinoma status post total laryngectomy, uses tracheoesophageal prosthesis for communication, prostate cancer last PSA 0.15, for which he completed his radiation therapy in 2015 with a new lung mass, he was noted to have increased back pain and MRI of the lumbar spine on 02/26/2018 show foci metastatic disease to L3 vertebrae by tumor. PET scan obtained on 03/17/2018 show 5.4 cm mass in the posterior left lower lobe of the lung, with multiple metastatic lymphadenopathy bilaterally in the mediastinum and increase radiotracer uptake throughout the spine sternum and pelvis.Brought intothe ED for progressive weakness shortness of breath and increased secretion through his tracheal tube.CT chest showed multilobar PNA. Pt seen by our SLP and water was noted to spill around TEP - therefore pt is aspirating.  SLP follow up to faciliate communication and work with ENT re: TEP replacement.  ENT saw pt today and did not change TEP due to leakage around the superior aspect of the prosthesis on swallowing liquids.    SLP spoke to ENT and noted recommendation for tube feeding for nutrition.   Follow up for education  with family to importance of oral care.       SLP Plan  Continue with current plan of care       Recommendations  Diet recommendations: NPO Medication Administration: Via alternative means                Follow up Recommendations: (tbd) SLP Visit Diagnosis: Aphonia (R49.1);Dysphagia, unspecified (R13.10) Plan: Continue with current plan of care       GO                Macario Golds 04/13/2018, 1:20 PM

## 2018-04-13 NOTE — Progress Notes (Signed)
  Speech Language Pathology Treatment: Dysphagia  Patient Details Name: Mykal Batiz MRN: [id number] DOB: 10-04-1930 Today's Date: 04/13/2018 Time: 2440-1027 SLP Time Calculation (min) (ACUTE ONLY): 10 min  Assessment / Plan / Recommendation Clinical Impression  Per Candace SLP- trials of nectar thick liquids given via cup.  If pt tolerates without aspiration, she advised it is not contraindicated for him to have small amounts of po.  Goal is to allow tissue to heal therefore reliance on Gtube for nutrition indicated.    Pt consumed approximately 2 ounces of nectar thick juice - No immediate loss of boluses around TEP.  Minimal "bubble" with very scant amount of secretions (light blue) leakage around upper 1/2 of TEP and adhering to around TEP.  Nothing spilled lower into trach.    Given pt has h/o surgical repair s/p laryngeal cancer which may impact pharyngeal/esophageal motility with possible stasis/refluxing - SLP will proceed with MBS next am to assure pt at least risk possible for aspiration.  Pt, family, RN informed and agreeable to plan.  Pt tentatively on schedule for 0800 to 0830 04/14/18.  Thanks for allowing me to help care for this pt.    HPI HPI: Afshin Chrystal an 84 y.o.malepast medical history of laryngeal carcinoma status post total laryngectomy, uses tracheoesophageal prosthesis for communication, prostate cancer last PSA 0.15, for which he completed his radiation therapy in 2015 with a new lung mass, he was noted to have increased back pain and MRI of the lumbar spine on 02/26/2018 show foci metastatic disease to L3 vertebrae by tumor. PET scan obtained on 03/17/2018 show 5.4 cm mass in the posterior left lower lobe of the lung, with multiple metastatic lymphadenopathy bilaterally in the mediastinum and increase radiotracer uptake throughout the spine sternum and pelvis.Brought intothe ED for progressive weakness shortness of breath and increased secretion through his tracheal  tube.CT chest showed multilobar PNA. Pt seen by our SLP and water was noted to spill around TEP - therefore pt is aspirating.  SLP follow up to faciliate communication and work with ENT re: TEP replacement.  ENT saw pt today and did not change TEP due to leakage around the superior aspect of the prosthesis on swallowing liquids.    SLP spoke to ENT and noted recommendation for tube feeding for nutrition.   Follow up for education with family to importance of oral care.       SLP Plan  Continue with current plan of care       Recommendations  Diet recommendations: NPO Medication Administration: Via alternative means                Follow up Recommendations: (tbd) SLP Visit Diagnosis: Aphonia (R49.1);Dysphagia, unspecified (R13.10) Plan: Continue with current plan of care       [place], Ronnesha Mester Ann 04/13/2018, 1:13 PM

## 2018-04-13 NOTE — Progress Notes (Signed)
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  Patient presented to the emergency department with progressive weakness, shortness of breath and increased secretions through his tracheal tube.  CT chest showed multilobar pneumonia and patient was admitted with working diagnosis of acute respiratory failure due to healthcare associated pneumonia.  During hospital stay had  biopsy which confirmed the diagnosis of a squamous cell.  Hospitalization was complicated with aspiration pneumonia, concern of leak through the TEP placing patient on high risk for aspiration.  Patient currently on antibiotic therapy. TEP was changed on 7/25. Working PR, radiation therapy and pain control.   Subjective: Patient seen and examined, he is up in chair with good spirits.  Denies pain.  Per family he was sleepy yesterday feel that pain medication was too much.  No acute events overnight.  Assessment & Plan: Acute resp failure with hypoxia due to HCAP, worsened with aspiration during hosp stay. - resolved Patient was initially treated with Levaquin, subsequently started on Unasyn for aspiration, transitioned to Augmentin to complete 10 days of total antibiotic and date 7/30. TEP was changed on 7/25 due presumed to be leaking. Chest x-ray done on 7/22 showed worsening infiltrates. Patient remains afebrile. Oxygen requirement remains at baseline  Moderate malnutrition due dysphagia and chronic diseases Laryngeal carcinoma status post tracheostomy, TEP was changed 7/25. S/p PEG placement, tolerated well procedure.  Tolerating feedings well.  Monitor electrolytes. New TEP seems to be leaking still with thin  fluids.  SLP to perform modified barium swallow to further assess  Essential hypertension BP remains stable, continue to monitor  Stage IV squamous cell carcinoma of the lung with metastasis to the bone, L3 transverse process Patient to receive last radiation therapy on 7/29.  Working on pain control now with oral medication.  Continue fentanyl to 100 mcg every 72 hours, for breakthrough pain will decrease morphine to 15 mg every 4 hours.  Patient report pain level 4-5 is tolerable, therefore that is the goal.  Seems to be that we have found a good pain regimen.  Acute blood loss anemia Hemoglobin remains stable.    DVT prophylaxis: SCDs Code Status: DNR Family Communication: Family at bedside Disposition Plan: SNF in 1 to 2 days  Consultants:   Oncology  Palliative care  Procedures:   PEG tube - 7/26  TEP changed 7/25  Antimicrobials: Anti-infectives (From admission, onward)   Start     Dose/Rate Route Frequency Ordered Stop   04/11/18 1700  amoxicillin-clavulanate (AUGMENTIN) 875-125 MG per tablet 1 tablet     1 tablet Oral Every 12 hours 04/11/18 1533     04/07/18 1000  Ampicillin-Sulbactam (UNASYN) 3 g in sodium chloride 0.9 % 100 mL IVPB  Status:  Discontinued     3 g 200 mL/hr over 30 Minutes Intravenous Every 6 hours 04/07/18 0821 04/11/18 1533   04/05/18 1200  levofloxacin (LEVAQUIN) IVPB 500 mg  Status:  Discontinued    Note to Pharmacy:  Last dose 7/24.changed to iv as patient having difficulty swallowing   500 mg 100 mL/hr over 60 Minutes Intravenous Daily 04/05/18 1046 04/07/18 0821   04/02/18 1200  levofloxacin (LEVAQUIN) 25 MG/ML solution 500 mg  Status:  Discontinued  500 mg Oral Daily 04/02/18 1017 04/02/18 1115   04/02/18 1200  levofloxacin (LEVAQUIN) tablet 500 mg  Status:  Discontinued     500 mg Oral Every 24 hours 04/02/18 1115 04/05/18 1046   03/31/18 2000  vancomycin (VANCOCIN) 1,250 mg in sodium chloride 0.9 % 250 mL IVPB  Status:  Discontinued      1,250 mg 166.7 mL/hr over 90 Minutes Intravenous Every 24 hours 03/31/18 1140 04/02/18 1013   03/31/18 1400  ceFEPIme (MAXIPIME) 1 g in sodium chloride 0.9 % 100 mL IVPB  Status:  Discontinued     1 g 200 mL/hr over 30 Minutes Intravenous Every 8 hours 03/31/18 1106 03/31/18 1257   03/31/18 1400  piperacillin-tazobactam (ZOSYN) IVPB 3.375 g  Status:  Discontinued     3.375 g 12.5 mL/hr over 240 Minutes Intravenous Every 8 hours 03/31/18 1257 04/02/18 1013   03/31/18 0200  vancomycin (VANCOCIN) IVPB 1000 mg/200 mL premix     1,000 mg 200 mL/hr over 60 Minutes Intravenous  Once 03/31/18 0157 03/31/18 0528   03/31/18 0200  piperacillin-tazobactam (ZOSYN) IVPB 3.375 g     3.375 g 12.5 mL/hr over 240 Minutes Intravenous  Once 03/31/18 0157 03/31/18 0501      Objective: Vitals:   04/12/18 2051 04/13/18 0511 04/13/18 0759 04/13/18 1131  BP:  (!) 111/52    Pulse:  85 85 (!) 102  Resp:  14 16 18   Temp:  98.5 F (36.9 C)    TempSrc:  Oral    SpO2: 90% 100% 90% (!) 89%  Weight:  84.1 kg (185 lb 6.5 oz)    Height:        Intake/Output Summary (Last 24 hours) at 04/13/2018 1428 Last data filed at 04/13/2018 0515 Gross per 24 hour  Intake -  Output 1150 ml  Net -1150 ml   Filed Weights   04/11/18 0430 04/12/18 0545 04/13/18 0511  Weight: 86.8 kg (191 lb 5.8 oz) 82.4 kg (181 lb 10.5 oz) 84.1 kg (185 lb 6.5 oz)    Examination:  General: NAD  Cardiovascular: RRR, S1/S2 + Respiratory: Good air entry, diffuse mild rhonchi transmitted sounds  Abdominal: Soft, NT, ND  Extremities: Trace b/l LE edema   Data Reviewed: I have personally reviewed following labs and imaging studies  CBC: Recent Labs  Lab 04/07/18 0454 04/09/18 0418 04/10/18 0415 04/11/18 0403 04/13/18 0428  WBC 18.7* 18.2* 19.1* 17.0* 16.4*  NEUTROABS  --  16.8* 17.4* 16.0*  --   HGB 9.7* 8.7* 8.6* 8.5* 8.4*  HCT 30.3* 27.3* 27.6* 26.7* 26.7*  MCV 82.6 82.5 84.1 83.2 82.7  PLT 189 185 211 237 017   Basic  Metabolic Panel: Recent Labs  Lab 04/07/18 0454 04/08/18 0355 04/09/18 0418 04/11/18 0403 04/13/18 0428  NA 134*  --  136 136 133*  K 3.9  --  3.6 3.6 4.4  CL 96*  --  98 97* 97*  CO2 28  --  29 29 28   GLUCOSE 145*  --  133* 127* 152*  BUN 11  --  14 16 19   CREATININE 0.63 0.68 0.58* 0.62 0.64  CALCIUM 8.8*  --  8.6* 8.7* 8.7*  MG  --   --  2.0 2.1 2.0  PHOS  --   --   --   --  2.9   GFR: Estimated Creatinine Clearance: 67.5 mL/min (by C-G formula based on SCr of 0.64 mg/dL). Liver Function Tests: Recent Labs  Lab 04/13/18 0428  ALBUMIN 1.7*  No results for input(s): LIPASE, AMYLASE in the last 168 hours. No results for input(s): AMMONIA in the last 168 hours. Coagulation Profile: Recent Labs  Lab 04/09/18 1359  INR 1.33   Cardiac Enzymes: No results for input(s): CKTOTAL, CKMB, CKMBINDEX, TROPONINI in the last 168 hours. BNP (last 3 results) No results for input(s): PROBNP in the last 8760 hours. HbA1C: No results for input(s): HGBA1C in the last 72 hours. CBG: Recent Labs  Lab 04/12/18 2040 04/13/18 0018 04/13/18 0400 04/13/18 0740 04/13/18 1201  GLUCAP 125* 152* 141* 144* 165*   Lipid Profile: No results for input(s): CHOL, HDL, LDLCALC, TRIG, CHOLHDL, LDLDIRECT in the last 72 hours. Thyroid Function Tests: No results for input(s): TSH, T4TOTAL, FREET4, T3FREE, THYROIDAB in the last 72 hours. Anemia Panel: No results for input(s): VITAMINB12, FOLATE, FERRITIN, TIBC, IRON, RETICCTPCT in the last 72 hours. Sepsis Labs: No results for input(s): PROCALCITON, LATICACIDVEN in the last 168 hours.  No results found for this or any previous visit (from the past 240 hour(s)).    Radiology Studies: No results found.   Scheduled Meds: . amoxicillin-clavulanate  1 tablet Oral Q12H  . dexamethasone  4 mg Intravenous Q12H  . ergocalciferol  5,000 Units Per Tube Daily  . feeding supplement (PRO-STAT SUGAR FREE 64)  30 mL Per Tube BID  . fentaNYL  100 mcg  Transdermal Q72H  . free water  100 mL Per Tube Q4H  . guaiFENesin  30 mL Oral BID  . pantoprazole sodium  40 mg Per Tube Daily  . polyethylene glycol  17 g Oral Daily  . potassium chloride  20 mEq Oral BID  . ranitidine  150 mg Per Tube QHS  . senna-docusate  1 tablet Oral BID  . tamsulosin  0.4 mg Oral Daily   Continuous Infusions: . feeding supplement (OSMOLITE 1.5 CAL) 1,000 mL (04/12/18 1619)     LOS: 13 days    Time spent: Total of 15 minutes spent with pt, greater than 50% of which was spent in discussion of  treatment, counseling and coordination of care   Chipper Oman, MD Pager: Text Page via [email or web address]   If 7PM-7AM, please contact night-coverage [email or web address] 04/13/2018, 2:28 PM   Note - This record has been created using [employer]. Chart creation errors have been sought, but may not always have been located. Such creation errors do not reflect on the standard of medical care.

## 2018-04-13 NOTE — Care Management Important Message (Signed)
Important Message  Patient Details  Name: [name] MRN: [id number] Date of Birth: 06-22-1931   Medicare Important Message Given:  Yes    Kerin Salen 04/13/2018, 10:41 AMImportant Message  Patient Details  Name: [name] MRN: [id number] Date of Birth: [birth year]   Medicare Important Message Given:  Yes    Kerin Salen 04/13/2018, 10:41 AM

## 2018-04-13 NOTE — Progress Notes (Signed)
Physical Therapy Treatment Patient Details Name: [name] MRN: [id number] DOB: 12/13/1930 Today's Date: 04/13/2018    History of Present Illness [name] is an 84 y.o. male past medical history of laryngeal carcinoma status post tracheostomy, prostate cancer MRI  02/26/2018 showed metastatic disease to L3 vertebrae by tumor.  PET scan obtained on 03/17/2018 shows mass of  left lower lobe, with multiple metastatic lymphadenopathy bilaterally in the mediastinum ,spine, sternum and pelvis.  Brought to ED 7/15 for progressive weakness, shortness of breath and increased secretion through his tracheal tube.    PT Comments    Patient received in bed today, very pleasant and willing to participate in PT; family reports that his pain medicine has changed and he seems to be tolerating this new medication much better. Able to complete functional bed mobility with ModAx2 and HOB elevated, mild posterior lean noted in sitting but able to correct with VC and Min guard/MinA. Able to perform functional transfers with ModAx2, took steps approximately 49f in distance today but very fatigued. O2 WNL, HR up to 120 likely due to deconditioning. Patient left up in chair with all needs met, family present this afternoon, nurse tech aware of performance during transfer today.     Follow Up Recommendations  SNF;Supervision/Assistance - 24 hour     Equipment Recommendations  Rolling walker with 5" wheels;3in1 (PT);Hospital bed    Recommendations for Other Services       Precautions / Restrictions Precautions Precautions: Fall Precaution Comments: trach collar O2 Restrictions Weight Bearing Restrictions: No    Mobility  Bed Mobility Overal bed mobility: Needs Assistance Bed Mobility: Supine to Sit     Supine to sit: Mod assist;+2 for physical assistance;HOB elevated     General bed mobility comments: ModAx2 to bring trunk up and scoot around to EOB; required min guard to MinA and VC for upright  sitting due to mild posterior lean   Transfers Overall transfer level: Needs assistance Equipment used: Rolling walker (2 wheeled) Transfers: Sit to/from Stand Sit to Stand: Mod assist;+2 physical assistance;+2 safety/equipment         General transfer comment: cues to power up to upright, hand placement   Ambulation/Gait Ambulation/Gait assistance: Min assist;+2 safety/equipment Gait Distance (Feet): 3 Feet Assistive device: Rolling walker (2 wheeled) Gait Pattern/deviations: Step-to pattern;Decreased step length - right;Decreased step length - left;Decreased stance time - left;Decreased stance time - right;Decreased dorsiflexion - right;Decreased dorsiflexion - left;Wide base of support     General Gait Details: able to take steps in room, very easily fatigued; O2 remained WNL on supplemental supply, HR to 120BPM with activity    Stairs             Wheelchair Mobility    Modified Rankin (Stroke Patients Only)       Balance Overall balance assessment: Needs assistance Sitting-balance support: Feet supported;Bilateral upper extremity supported Sitting balance-Leahy Scale: Poor Sitting balance - Comments: posterior lean, able to correct with VC  Postural control: Posterior lean Standing balance support: Bilateral upper extremity supported;During functional activity Standing balance-Leahy Scale: Poor Standing balance comment: reliant on external support                             Cognition Arousal/Alertness: Awake/alert Behavior During Therapy: Flat affect Overall Cognitive Status: Within Functional Limits for tasks assessed  General Comments: patient engaging much more with PT today, able to answer questions appropriately and followed cues well       Exercises      General Comments        Pertinent Vitals/Pain Pain Assessment: 0-10 Pain Score: 5  Pain Location: throat, back Pain Descriptors /  Indicators: Aching;Discomfort;Sore Pain Intervention(s): Limited activity within patient's tolerance;Monitored during session;Premedicated before session    Home Living                      Prior Function            PT Goals (current goals can now be found in the care plan section) Acute Rehab PT Goals Patient Stated Goal: none stated.  Pt needed to use toilet and agreeable for up to chair PT Goal Formulation: With patient/family Time For Goal Achievement: 04/16/18 Potential to Achieve Goals: Good Progress towards PT goals: Progressing toward goals    Frequency    Min 3X/week      PT Plan Current plan remains appropriate    Co-evaluation              AM-PAC PT "6 Clicks" Daily Activity  Outcome Measure  Difficulty turning over in bed (including adjusting bedclothes, sheets and blankets)?: A Little Difficulty moving from lying on back to sitting on the side of the bed? : A Lot Difficulty sitting down on and standing up from a chair with arms (e.g., wheelchair, bedside commode, etc,.)?: A Lot Help needed moving to and from a bed to chair (including a wheelchair)?: A Lot Help needed walking in hospital room?: A Lot Help needed climbing 3-5 steps with a railing? : A Lot 6 Click Score: 13    End of Session Equipment Utilized During Treatment: Oxygen;Gait belt Activity Tolerance: Patient tolerated treatment well Patient left: in chair;with call bell/phone within reach;with family/visitor present;with chair alarm set Nurse Communication: Mobility status PT Visit Diagnosis: Unsteadiness on feet (R26.81);Pain Pain - Right/Left: (back )     Time: 7943-2761 PT Time Calculation (min) (ACUTE ONLY): 23 min  Charges:  $Therapeutic Activity: 23-37 mins                     Deniece Ree PT, DPT, CBIS  Supplemental Physical Therapist [place]   Pager [phone]

## 2018-04-14 ENCOUNTER — Inpatient Hospital Stay (HOSPITAL_COMMUNITY): Payer: Medicare Other

## 2018-04-14 LAB — GLUCOSE, CAPILLARY
GLUCOSE-CAPILLARY: 158 mg/dL — AB (ref 70–99)
GLUCOSE-CAPILLARY: 136 mg/dL — AB (ref 70–99)
Glucose-Capillary: 142 mg/dL — ABNORMAL HIGH (ref 70–99)
Glucose-Capillary: 139 mg/dL — ABNORMAL HIGH (ref 70–99)
Glucose-Capillary: 120 mg/dL — ABNORMAL HIGH (ref 70–99)
Glucose-Capillary: 153 mg/dL — ABNORMAL HIGH (ref 70–99)

## 2018-04-14 LAB — CBC
HCT: 25.5 % — ABNORMAL LOW (ref 39.0–52.0)
HEMOGLOBIN: 8 g/dL — AB (ref 13.0–17.0)
MCH: 26.5 pg (ref 26.0–34.0)
MCHC: 31.4 g/dL (ref 30.0–36.0)
MCV: 84.4 fL (ref 78.0–100.0)
PLATELETS: 228 10*3/uL (ref 150–400)
RBC: 3.02 MIL/uL — AB (ref 4.22–5.81)
RDW: 15.1 % (ref 11.5–15.5)
WBC: 16.1 10*3/uL — AB (ref 4.0–10.5)

## 2018-04-14 MED ORDER — MORPHINE SULFATE 15 MG PO TABS
7.5000 mg | ORAL_TABLET | ORAL | Status: DC | PRN
Start: 2018-04-14 — End: 2018-04-16
  Administered 2018-04-14 – 2018-04-15 (×3): 7.5 mg
  Filled 2018-04-14 (×3): qty 1

## 2018-04-14 MED ORDER — RESOURCE THICKENUP CLEAR PO POWD
ORAL | Status: DC | PRN
  Filled 2018-04-14: qty 125

## 2018-04-14 MED ORDER — ALBUTEROL SULFATE (2.5 MG/3ML) 0.083% IN NEBU
2.5000 mg | INHALATION_SOLUTION | RESPIRATORY_TRACT | Status: DC | PRN
  Administered 2018-04-14 – 2018-04-15 (×2): 2.5 mg via RESPIRATORY_TRACT
  Filled 2018-04-14 (×3): qty 3

## 2018-04-14 NOTE — Progress Notes (Signed)
  Speech Language Pathology Treatment: Dysphagia  Patient Details Name: Dondrell Loudermilk MRN: [id number] DOB: 1931/02/12 Today's Date: 04/14/2018 Time: 1510-1530 SLP Time Calculation (min) (ACUTE ONLY): 20 min  Assessment / Plan / Recommendation Clinical Impression  Upon further discussion with family/pt suspect he actually regurgitated instead of vomited after his MBS this am.    Advised again that MBS was suboptimal due to shoulder blocking and pt discomfort.  However trace amount of barium noted at TEP site with cover removed during MBS and given pt aspirated thin during clinical testing - would recommend to have small amounts of soft/nectar.    Pt reported he vomiting last night however.  Today after MBS, he regurgitated tube feeding mixed with barium.  Advised him and family to monitor closely.   Pt's tube feed was running during MBS/exam per SLP conversation with RN, which likely then was refluxed and mixed with barium.  Posted signs above bed for HOB restriction and placed 30 degree lock on bed.      HPI HPI: Edwar Coe an 84 y.o.malepast medical history of laryngeal carcinoma status post total laryngectomy, uses tracheoesophageal prosthesis for communication, prostate cancer last PSA 0.15, for which he completed his radiation therapy in 2015 with a new lung mass, he was noted to have increased back pain and MRI of the lumbar spine on 02/26/2018 show foci metastatic disease to L3 vertebrae by tumor. PET scan obtained on 03/17/2018 show 5.4 cm mass in the posterior left lower lobe of the lung, with multiple metastatic lymphadenopathy bilaterally in the mediastinum and increase radiotracer uptake throughout the spine sternum and pelvis.Brought intothe ED for progressive weakness shortness of breath and increased secretion through his tracheal tube.CT chest showed multilobar PNA. Pt seen by our SLP and water was noted to spill around TEP - therefore pt is aspirating.  SLP follow up to  faciliate communication and work with ENT re: TEP replacement.  ENT saw pt today and did not change TEP due to leakage around the superior aspect of the prosthesis on swallowing liquids.    SLP spoke to ENT and noted recommendation for tube feeding for nutrition.   Follow up for education with family to importance of oral care.       SLP Plan          Recommendations                           GO                Macario Golds 04/14/2018, 3:35 PM  Luanna Salk, [facility] SLP [phone]

## 2018-04-14 NOTE — Plan of Care (Signed)
  Problem: Nutrition: Goal: Adequate nutrition will be maintained Outcome: Progressing   Problem: Elimination: Goal: Will not experience complications related to bowel motility Outcome: Progressing   Problem: Pain Managment: Goal: General experience of comfort will improve Outcome: Progressing   

## 2018-04-14 NOTE — Progress Notes (Addendum)
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  Patient presented to the emergency department with progressive weakness, shortness of breath and increased secretions through his tracheal tube.  CT chest showed multilobar pneumonia and patient was admitted with working diagnosis of acute respiratory failure due to healthcare associated pneumonia.  During hospital stay had  biopsy which confirmed the diagnosis of a squamous cell.  Hospitalization was complicated with aspiration pneumonia, concern of leak through the TEP placing patient on high risk for aspiration.  Patient currently on antibiotic therapy. TEP was changed on 7/25. Working PR, radiation therapy and pain control.   Subjective: Patient seen and examined, pain is well controlled, however he remains sleepy. Spiked fever overnight. Had MBS today and vomited Tube feeds and barium.   Assessment & Plan: Acute resp failure with hypoxia due to HCAP, worsened with aspiration during hosp stay. - resolved Patient was initially treated with Levaquin, subsequently started on Unasyn for aspiration, transitioned to Augmentin to complete 10 days of total antibiotic and date 7/30. TEP was changed on 7/25 due presumed to be leaking. Chest x-ray done on 7/22 showed worsening infiltrates. Patient remains afebrile. Oxygen requirement remains at baseline.  Isolated fever  Patient feels congested, will repeat CXR ? Aspiration again after swallow eval. Get urine cultures. Will not escalate abx therapy yet. Leukocytosis likely from high dose steroids  Moderate malnutrition due dysphagia and chronic diseases Laryngeal carcinoma status post  tracheostomy, TEP was changed 7/25. S/p PEG placement, tolerated well procedure.  Tolerating feedings well.  Monitor electrolytes. MBS shows some esophageal residual suspected mixed tube feed with barium. Patient likely with severe reflux. Will add Reglan 5 mg TID. Start pepid BID. May need to adjust feeding tube to boluses rather than continuous.   Essential hypertension BP remains stable   Stage IV squamous cell carcinoma of the lung with metastasis to the bone, L3 transverse process Patient to receive last radiation therapy on 7/29.  Working on pain control now with oral medication.  Continue fentanyl to 100 mcg every 72 hours, for breakthrough pain, will decrease morphine to 7.5 mg every 4 hours.  Patient report pain level 4-5 is tolerable, therefore that is the goal.  Seems to be that we have found a good pain regimen.  Acute blood loss anemia Hemoglobin remains stable.    DVT prophylaxis: SCDs Code Status: DNR Family Communication: Family at bedside Disposition Plan: SNF in 1 to 2 days  Consultants:   Oncology  Palliative care  Procedures:   PEG tube - 7/26  TEP changed 7/25  Antimicrobials: Anti-infectives (From admission, onward)   Start     Dose/Rate Route Frequency Ordered Stop   04/11/18 1700  amoxicillin-clavulanate (AUGMENTIN) 875-125 MG per tablet 1 tablet     1 tablet Oral Every 12 hours 04/11/18 1533 04/15/18 0831   04/07/18 1000  Ampicillin-Sulbactam (UNASYN) 3 g in sodium chloride 0.9 % 100 mL IVPB  Status:  Discontinued     3 g 200 mL/hr over 30 Minutes Intravenous Every 6 hours 04/07/18 0821 04/11/18 1533   04/05/18 1200  levofloxacin (LEVAQUIN) IVPB 500 mg  Status:  Discontinued    Note to Pharmacy:  Last dose 7/24.changed to  iv as patient having difficulty swallowing   500 mg 100 mL/hr over 60 Minutes Intravenous Daily 04/05/18 1046 04/07/18 0821   04/02/18 1200  levofloxacin (LEVAQUIN) 25 MG/ML solution 500 mg  Status:  Discontinued     500 mg Oral  Daily 04/02/18 1017 04/02/18 1115   04/02/18 1200  levofloxacin (LEVAQUIN) tablet 500 mg  Status:  Discontinued     500 mg Oral Every 24 hours 04/02/18 1115 04/05/18 1046   03/31/18 2000  vancomycin (VANCOCIN) 1,250 mg in sodium chloride 0.9 % 250 mL IVPB  Status:  Discontinued     1,250 mg 166.7 mL/hr over 90 Minutes Intravenous Every 24 hours 03/31/18 1140 04/02/18 1013   03/31/18 1400  ceFEPIme (MAXIPIME) 1 g in sodium chloride 0.9 % 100 mL IVPB  Status:  Discontinued     1 g 200 mL/hr over 30 Minutes Intravenous Every 8 hours 03/31/18 1106 03/31/18 1257   03/31/18 1400  piperacillin-tazobactam (ZOSYN) IVPB 3.375 g  Status:  Discontinued     3.375 g 12.5 mL/hr over 240 Minutes Intravenous Every 8 hours 03/31/18 1257 04/02/18 1013   03/31/18 0200  vancomycin (VANCOCIN) IVPB 1000 mg/200 mL premix     1,000 mg 200 mL/hr over 60 Minutes Intravenous  Once 03/31/18 0157 03/31/18 0528   03/31/18 0200  piperacillin-tazobactam (ZOSYN) IVPB 3.375 g     3.375 g 12.5 mL/hr over 240 Minutes Intravenous  Once 03/31/18 0157 03/31/18 0501      Objective: Vitals:   04/13/18 2231 04/14/18 0107 04/14/18 0437 04/14/18 0734  BP:   (!) 107/57   Pulse:   80 (!) 112  Resp:   16 16  Temp: 100.3 F (37.9 C) 98.9 F (37.2 C) 98.2 F (36.8 C)   TempSrc: Oral Oral Oral   SpO2:  96% 99% 92%  Weight:      Height:        Intake/Output Summary (Last 24 hours) at 04/14/2018 1336 Last data filed at 04/14/2018 0930 Gross per 24 hour  Intake 620 ml  Output 1975 ml  Net -1355 ml   Filed Weights   04/11/18 0430 04/12/18 0545 04/13/18 0511  Weight: 86.8 kg (191 lb 5.8 oz) 82.4 kg (181 lb 10.5 oz) 84.1 kg (185 lb 6.5 oz)    Examination:  General: Sleepy, but responsive  Cardiovascular: RRR, S1/S2 + Respiratory: Good air movement, diffuse rhonchi b/l  Abdominal: Soft, NT, ND Extremities: Trace edema b/l   Data Reviewed: I have personally reviewed following labs and imaging studies  CBC: Recent  Labs  Lab 04/09/18 0418 04/10/18 0415 04/11/18 0403 04/13/18 0428 04/14/18 0418  WBC 18.2* 19.1* 17.0* 16.4* 16.1*  NEUTROABS 16.8* 17.4* 16.0*  --   --   HGB 8.7* 8.6* 8.5* 8.4* 8.0*  HCT 27.3* 27.6* 26.7* 26.7* 25.5*  MCV 82.5 84.1 83.2 82.7 84.4  PLT 185 211 237 245 948   Basic Metabolic Panel: Recent Labs  Lab 04/08/18 0355 04/09/18 0418 04/11/18 0403 04/13/18 0428  NA  --  136 136 133*  K  --  3.6 3.6 4.4  CL  --  98 97* 97*  CO2  --  29 29 28   GLUCOSE  --  133* 127* 152*  BUN  --  14 16 19   CREATININE 0.68 0.58* 0.62 0.64  CALCIUM  --  8.6* 8.7* 8.7*  MG  --  2.0 2.1 2.0  PHOS  --   --   --  2.9   GFR: Estimated Creatinine  Clearance: 67.5 mL/min (by C-G formula based on SCr of 0.64 mg/dL). Liver Function Tests: Recent Labs  Lab 04/13/18 0428  ALBUMIN 1.7*   No results for input(s): LIPASE, AMYLASE in the last 168 hours. No results for input(s): AMMONIA in the last 168 hours. Coagulation Profile: Recent Labs  Lab 04/09/18 1359  INR 1.33   Cardiac Enzymes: No results for input(s): CKTOTAL, CKMB, CKMBINDEX, TROPONINI in the last 168 hours. BNP (last 3 results) No results for input(s): PROBNP in the last 8760 hours. HbA1C: No results for input(s): HGBA1C in the last 72 hours. CBG: Recent Labs  Lab 04/13/18 2049 04/14/18 0101 04/14/18 0432 04/14/18 0741 04/14/18 1212  GLUCAP 145* 142* 139* 120* 153*   Lipid Profile: No results for input(s): CHOL, HDL, LDLCALC, TRIG, CHOLHDL, LDLDIRECT in the last 72 hours. Thyroid Function Tests: No results for input(s): TSH, T4TOTAL, FREET4, T3FREE, THYROIDAB in the last 72 hours. Anemia Panel: No results for input(s): VITAMINB12, FOLATE, FERRITIN, TIBC, IRON, RETICCTPCT in the last 72 hours. Sepsis Labs: No results for input(s): PROCALCITON, LATICACIDVEN in the last 168 hours.  No results found for this or any previous visit (from the past 240 hour(s)).    Radiology Studies: No results  found.   Scheduled Meds: . amoxicillin-clavulanate  1 tablet Oral Q12H  . dexamethasone  4 mg Intravenous Q12H  . ergocalciferol  5,000 Units Per Tube Daily  . feeding supplement (PRO-STAT SUGAR FREE 64)  30 mL Per Tube BID  . fentaNYL  100 mcg Transdermal Q72H  . free water  100 mL Per Tube Q4H  . guaiFENesin  30 mL Oral BID  . pantoprazole sodium  40 mg Per Tube Daily  . polyethylene glycol  17 g Oral Daily  . potassium chloride  20 mEq Oral BID  . ranitidine  150 mg Per Tube QHS  . senna-docusate  1 tablet Oral BID  . tamsulosin  0.4 mg Oral Daily   Continuous Infusions: . feeding supplement (OSMOLITE 1.5 CAL) 1,000 mL (04/13/18 1652)     LOS: 14 days    Time spent: Total of 15 minutes spent with pt, greater than 50% of which was spent in discussion of  treatment, counseling and coordination of care   Chipper Oman, MD Pager: Text Page via [email or web address]   If 7PM-7AM, please contact night-coverage [email or web address] 04/14/2018, 1:36 PM   Note - This record has been created using [employer]. Chart creation errors have been sought, but may not always have been located. Such creation errors do not reflect on the standard of medical care.

## 2018-04-14 NOTE — Progress Notes (Signed)
Modified Barium Swallow Progress Note  Patient Details  Name: [name] MRN: [id number] Date of Birth: August 24, 1931  Today's Date: 04/14/2018  Modified Barium Swallow completed.  Full report located under Chart Review in the Imaging Section.  Brief recommendations include the following:  Clinical Impression  Technically challenging test due to pt's discomfort and shoulders blocking much of airway.   From best view possible, pt without aspiration through the TEP of any consistency tested *thin, nectar, pudding, cracker.  He does have decreased motility likely due to surgical repair resulting in residuals.  Pt does not consistently sense residuals but liquids help to decrease thicker boluses.   Upon esophageal sweep, pt appeared with residuals throughout (suspect mixed with tube feeding) with backflow of thin very near cervical esophagus WITHOUT awareness.  If pt to consume po intake, recommend small amounts only = and would not recommend pt have HOB below 30 degrees for any length of time due to refluxing.  Larger concern for aspiration risk at this time is pt's refluxing to which he has NO sensation.  Educated family/pt to findings/reinforced effective compensation strategies. Will follow up for education/diet tolerance.     Of note, pt vomited after testing with tube feeding colored secretions noted.  Family present and aware.    Swallow Evaluation Recommendations       SLP Diet Recommendations: (small amounts of intake of solids/nectar liquids)   Liquid Administration via: Cup;Straw   Medication Administration: Via alternative means       Compensations: Small sips/bites;Slow rate;Follow solids with liquid;Other (Comment)(intermittent dry swallow)   Postural Changes: Seated upright at 90 degrees;Remain semi-upright after after feeds/meals (Comment)   Oral Care Recommendations: Oral care QID   Other Recommendations: Order thickener from pharmacy;Have oral suction  available    Macario Golds 04/14/2018,9:41 AM  Luanna Salk, [facility] SLP [phone]

## 2018-04-14 NOTE — Progress Notes (Signed)
OT Cancellation Note  Patient Details Name: [name] MRN: [id number] DOB: July 17, 1931   Cancelled Treatment:    Reason Eval/Treat Not Completed: Patient declined, no reason specified.  Pt requests that OT returns tomorrow. Will try to do this.    [name] 04/14/2018, 10:22 AM  Lesle Chris, OTR/L [phone] 04/14/2018

## 2018-04-15 ENCOUNTER — Encounter: Payer: Self-pay | Admitting: Radiation Oncology

## 2018-04-15 DIAGNOSIS — Z789 Other specified health status: Secondary | ICD-10-CM

## 2018-04-15 LAB — GLUCOSE, CAPILLARY
GLUCOSE-CAPILLARY: 145 mg/dL — AB (ref 70–99)
GLUCOSE-CAPILLARY: 149 mg/dL — AB (ref 70–99)
GLUCOSE-CAPILLARY: 156 mg/dL — AB (ref 70–99)
GLUCOSE-CAPILLARY: 171 mg/dL — AB (ref 70–99)
GLUCOSE-CAPILLARY: 97 mg/dL (ref 70–99)
Glucose-Capillary: 193 mg/dL — ABNORMAL HIGH (ref 70–99)

## 2018-04-15 LAB — BASIC METABOLIC PANEL
ANION GAP: 7 (ref 5–15)
BUN: 23 mg/dL (ref 8–23)
CHLORIDE: 98 mmol/L (ref 98–111)
CO2: 30 mmol/L (ref 22–32)
Calcium: 9 mg/dL (ref 8.9–10.3)
Creatinine, Ser: 0.59 mg/dL — ABNORMAL LOW (ref 0.61–1.24)
GFR calc Af Amer: >60 mL/min (ref >60)
Glucose, Bld: 176 mg/dL — ABNORMAL HIGH (ref 70–99)
Potassium: 4.5 mmol/L (ref 3.5–5.1)
SODIUM: 135 mmol/L (ref 135–145)

## 2018-04-15 LAB — CBC WITH DIFFERENTIAL/PLATELET
BASOS ABS: 0 10*3/uL (ref 0.0–0.1)
Basophils Relative: 0 %
Eosinophils Absolute: 0 10*3/uL (ref 0.0–0.7)
Eosinophils Relative: 0 %
HEMATOCRIT: 25.3 % — AB (ref 39.0–52.0)
HEMOGLOBIN: 7.9 g/dL — AB (ref 13.0–17.0)
LYMPHS ABS: 0.6 10*3/uL — AB (ref 0.7–4.0)
Lymphocytes Relative: 4 %
MCH: 26.2 pg (ref 26.0–34.0)
MCHC: 31.2 g/dL (ref 30.0–36.0)
MCV: 84.1 fL (ref 78.0–100.0)
Monocytes Absolute: 0.8 10*3/uL (ref 0.1–1.0)
Monocytes Relative: 4 %
NEUTROS ABS: 17 10*3/uL — AB (ref 1.7–7.7)
NEUTROS PCT: 92 %
PLATELETS: 236 10*3/uL (ref 150–400)
RBC: 3.01 MIL/uL — AB (ref 4.22–5.81)
RDW: 15.4 % (ref 11.5–15.5)
WBC: 18.5 10*3/uL — AB (ref 4.0–10.5)

## 2018-04-15 LAB — PHOSPHORUS: Phosphorus: 2.8 mg/dL (ref 2.5–4.6)

## 2018-04-15 LAB — MAGNESIUM: Magnesium: 2 mg/dL (ref 1.7–2.4)

## 2018-04-15 MED ORDER — PANCRELIPASE (LIP-PROT-AMYL) 10440-39150 UNITS PO TABS
20880.0000 [IU] | ORAL_TABLET | Freq: Once | ORAL | Status: AC
  Administered 2018-04-15: 20880 [IU]
  Filled 2018-04-15: qty 2

## 2018-04-15 MED ORDER — POLYETHYLENE GLYCOL 3350 17 G PO PACK
17.0000 g | PACK | Freq: Every day | ORAL | Status: DC
  Administered 2018-04-15: 17 g
  Filled 2018-04-15: qty 1

## 2018-04-15 MED ORDER — SODIUM BICARBONATE 650 MG PO TABS
650.0000 mg | ORAL_TABLET | Freq: Once | ORAL | Status: AC
  Administered 2018-04-15: 650 mg
  Filled 2018-04-15: qty 1

## 2018-04-15 MED ORDER — BISACODYL 10 MG RE SUPP
10.0000 mg | Freq: Every day | RECTAL | Status: DC | PRN
  Administered 2018-04-15: 10 mg via RECTAL
  Filled 2018-04-15: qty 1

## 2018-04-15 NOTE — Progress Notes (Signed)
Informed by off going nurse that PEG tube was beginning to become clogged. This RN assessed the PEG tube and determined it was clogged and feedings stopped. This RN initiated the standing orders for unclogging tube and was unsuccessful. Notified MD and she states consult with IR to resolve issue. Will put in IR consult. IR was consulted and MD Laurence Ferrari and his team can resolve issue on 8/1 but will have to stop feedings overnight.

## 2018-04-15 NOTE — Progress Notes (Signed)
OT Cancellation Note  Patient Details Name: [name] MRN: [id number] DOB: 10-03-1930   Cancelled Treatment:    Reason Eval/Treat Not Completed: Other (comment).  Checked on pt twice today.  MD had talked to pt about hospice prior to second attempt.  It looks as if pt is moving towards comfort care. Will check back for plan and sign off if appropriate.  Dakotah Heiman   04/15/2018, 2:03 PM  Lesle Chris, OTR/L [phone] 04/15/2018

## 2018-04-15 NOTE — Progress Notes (Signed)
Nutrition Brief Note  Chart reviewed. Pt now transitioning to comfort care.  No further nutrition interventions warranted at this time.   Samuel Mcpeek, MS, RD, LDN [facility] Pager: [phone] After Hours Pager: [phone]   

## 2018-04-15 NOTE — Progress Notes (Signed)
Pt placed on 35% aerosol set up with open stoma. Pts stoma is dry and family wanted something different other than the hme button.

## 2018-04-15 NOTE — Progress Notes (Signed)
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  Patient presented to the emergency department with progressive weakness, shortness of breath and increased secretions through his tracheal tube.  CT chest showed multilobar pneumonia and patient was admitted with working diagnosis of acute respiratory failure due to healthcare associated pneumonia.  During hospital stay, he had biopsy which confirmed the diagnosis of a squamous cell. Hospitalization was complicated with aspiration pneumonia, concern of leak through the TEP (tracheoesophageal voice prosthesis) placing patient on high risk for aspiration.  Patient was treated with antibiotics. TEP was changed on 7/25.  He underwent PEG placement for feeding and malnutrition.   New events last 24 hours / Subjective: He had another fever of 100.5, WBC increased to 18.5.  Blood cultures obtained this morning.  Patient without any new complaints today.  Per daughter at bedside, patient has been more alert and interactive today compared to yesterday.  No further vomiting noted since MBS yesterday. Patient has not had BM since Saturday.   After my initial morning evaluation, patient's daughter requested patient be transferred to another hospital facility such as [facility] or [place].  We discussed that patient has no medical needs for transfer to another facility as this is reserved for procedures or treatments that we cannot provide at [facility].  Daughter was very upset regarding the fact that patient has seen numerous hospitalist in the past 15 days while he has been in the hospital and does not feel comfortable with care here.   Assessment & Plan:     Principal Problem:   Acute respiratory failure with hypoxia (HCC) Active Problems:   Essential hypertension   Hypothyroidism   Metastatic cancer to bone (HCC)   Acute blood loss anemia   Acute respiratory failure (HCC)   HCAP (healthcare-associated pneumonia)   Hypernatremia   Malnutrition of moderate degree   Acute resp failure with hypoxia due to HCAP, worsened with aspiration during hosp stay  Patient was initially treated with Levaquin, subsequently started on Unasyn for aspiration, transitioned to Augmentin to complete 10 days of total antibiotic end date 7/30. Chest x-ray done on 7/22 showed worsening infiltrates. TEP was changed on 7/25 due presumed to be leaking. Repeat CXR on 7/26 and 7/30 were reviewed independently, without acute infiltrates and showed improvement in appearance overall compared to CXR from 7/22  Fever No source so far, he has completed treatment for HCAP as above.  Blood cultures ordered, pending  Moderate malnutrition due dysphagia and chronic diseases Laryngeal carcinoma status post tracheostomy, TEP was changed 7/25. S/p PEG placement. MBS shows some esophageal residual suspected mixed tube feed with barium  Essential hypertension BP stable today  Stage IV squamous cell carcinoma of the lung with metastasis to the bone, L3 transverse process Patient to receive last radiation therapy on 7/29.  Working on pain control now with oral medication.  Continue fentanyl to 100 mcg every 72 hours, for breakthrough pain, will decrease MSIR to 7.5 mg every 4 hours.  Patient report pain level 4-5 is tolerable, therefore that is the goal.  Appears to be well controlled  Dr. Alen Blew discussed with family regarding patient's treatment goals.  He recommends proceeding with hospice care, comfort care.  No plans for any anticancer treatments now or in the future.  Life expectancy less than 2 months.   DVT prophylaxis: SCD Code Status: DNR Family Communication:  Daughter and wife at bedside, again discussed patient's care over the phone with daughter in the afternoon    Consultants:   Oncology  Palliative care  IR   Procedures:   TEP changed 7/25  PEG tube 7/26  Antimicrobials:  Anti-infectives (From admission, onward)   Start     Dose/Rate Route Frequency Ordered Stop   04/11/18 1700  amoxicillin-clavulanate (AUGMENTIN) 875-125 MG per tablet 1 tablet     1 tablet Oral Every 12 hours 04/11/18 1533 04/14/18 2213   04/07/18 1000  Ampicillin-Sulbactam (UNASYN) 3 g in sodium chloride 0.9 % 100 mL IVPB  Status:  Discontinued     3 g 200 mL/hr over 30 Minutes Intravenous Every 6 hours 04/07/18 0821 04/11/18 1533   04/05/18 1200  levofloxacin (LEVAQUIN) IVPB 500 mg  Status:  Discontinued    Note to Pharmacy:  Last dose 7/24.changed to iv as patient having difficulty swallowing   500 mg 100 mL/hr over 60 Minutes Intravenous Daily 04/05/18 1046 04/07/18 0821   04/02/18 1200  levofloxacin (LEVAQUIN) 25 MG/ML solution 500 mg  Status:  Discontinued     500 mg Oral Daily 04/02/18 1017 04/02/18 1115   04/02/18 1200  levofloxacin (LEVAQUIN) tablet 500 mg  Status:  Discontinued     500 mg Oral Every 24 hours 04/02/18 1115 04/05/18 1046   03/31/18 2000  vancomycin (VANCOCIN) 1,250 mg in sodium chloride 0.9 % 250 mL IVPB  Status:  Discontinued     1,250 mg 166.7 mL/hr over 90 Minutes Intravenous Every 24 hours 03/31/18 1140 04/02/18 1013   03/31/18 1400  ceFEPIme (MAXIPIME) 1 g in sodium chloride 0.9 % 100 mL IVPB  Status:  Discontinued     1 g 200 mL/hr over 30 Minutes Intravenous Every 8 hours 03/31/18 1106 03/31/18 1257   03/31/18 1400  piperacillin-tazobactam (ZOSYN) IVPB 3.375 g  Status:  Discontinued     3.375 g 12.5 mL/hr over 240 Minutes Intravenous Every 8 hours 03/31/18 1257 04/02/18 1013   03/31/18 0200  vancomycin (VANCOCIN) IVPB 1000 mg/200 mL premix     1,000 mg 200 mL/hr over 60 Minutes Intravenous  Once 03/31/18 0157 03/31/18 0528    03/31/18 0200  piperacillin-tazobactam (ZOSYN) IVPB 3.375 g     3.375 g 12.5 mL/hr over 240 Minutes Intravenous  Once 03/31/18 0157 03/31/18 0501       Objective: Vitals:   04/15/18 0438 04/15/18 0753 04/15/18 1021 04/15/18 1323  BP: (!) 127/55   (!) 130/51  Pulse: 98 (!) 110  92  Resp: 20 20  18   Temp: 99 F (37.2 C)   99.9 F (37.7 C)  TempSrc: Oral   Oral  SpO2: 95% 90% 97% 96%  Weight: 80.8 kg (178 lb 2.1 oz)     Height:        Intake/Output Summary (Last 24 hours) at 04/15/2018 1358 Last data filed at 04/15/2018 1300 Gross per 24 hour  Intake 1885 ml  Output 950 ml  Net 935 ml   Filed Weights   04/12/18 0545 04/13/18 0511 04/15/18 0438  Weight: 82.4 kg (181 lb 10.5 oz) 84.1 kg (185 lb 6.5 oz) 80.8 kg (178 lb 2.1 oz)    Examination:  General exam: Appears calm and comfortable, frail appearing  Respiratory system: Clear to auscultation. Respiratory effort normal. Trach in place  Cardiovascular system: S1 & S2 heard, RRR. No JVD, murmurs, rubs, gallops or clicks. No pedal edema. Gastrointestinal system: Abdomen is nondistended, soft and nontender. No organomegaly or masses felt. Normal bowel sounds heard. +PEG  Central nervous system: Alert and oriented. No focal neurological deficits. Extremities: Symmetric  Skin: No rashes, lesions or ulcers on exposed skin   Data Reviewed: I have personally reviewed following labs and imaging studies  CBC: Recent Labs  Lab 04/09/18 0418 04/10/18 0415 04/11/18 0403 04/13/18 0428 04/14/18 0418 04/15/18 0423  WBC 18.2* 19.1* 17.0* 16.4* 16.1* 18.5*  NEUTROABS 16.8* 17.4* 16.0*  --   --  17.0*  HGB 8.7* 8.6* 8.5* 8.4* 8.0* 7.9*  HCT 27.3* 27.6* 26.7* 26.7* 25.5* 25.3*  MCV 82.5 84.1 83.2 82.7 84.4 84.1  PLT 185 211 237 245 228 338   Basic Metabolic Panel: Recent Labs  Lab 04/09/18 0418 04/11/18 0403 04/13/18 0428 04/15/18 0423  NA 136 136 133* 135  K 3.6 3.6 4.4 4.5  CL 98 97* 97* 98  CO2 29 29 28 30   GLUCOSE  133* 127* 152* 176*  BUN 14 16 19 23   CREATININE 0.58* 0.62 0.64 0.59*  CALCIUM 8.6* 8.7* 8.7* 9.0  MG 2.0 2.1 2.0 2.0  PHOS  --   --  2.9 2.8   GFR: Estimated Creatinine Clearance: 67.5 mL/min (A) (by C-G formula based on SCr of 0.59 mg/dL (L)). Liver Function Tests: Recent Labs  Lab 04/13/18 0428  ALBUMIN 1.7*   No results for input(s): LIPASE, AMYLASE in the last 168 hours. No results for input(s): AMMONIA in the last 168 hours. Coagulation Profile: Recent Labs  Lab 04/09/18 1359  INR 1.33   Cardiac Enzymes: No results for input(s): CKTOTAL, CKMB, CKMBINDEX, TROPONINI in the last 168 hours. BNP (last 3 results) No results for input(s): PROBNP in the last 8760 hours. HbA1C: No results for input(s): HGBA1C in the last 72 hours. CBG: Recent Labs  Lab 04/14/18 2010 04/15/18 0020 04/15/18 0433 04/15/18 0736 04/15/18 1207  GLUCAP 136* 145* 171* 156* 193*   Lipid Profile: No results for input(s): CHOL, HDL, LDLCALC, TRIG, CHOLHDL, LDLDIRECT in the last 72 hours. Thyroid Function Tests: No results for input(s): TSH, T4TOTAL, FREET4, T3FREE, THYROIDAB in the last 72 hours. Anemia Panel: No results for input(s): VITAMINB12, FOLATE, FERRITIN, TIBC, IRON, RETICCTPCT in the last 72 hours. Sepsis Labs: No results for input(s): PROCALCITON, LATICACIDVEN in the last 168 hours.  No results found for this or any previous visit (from the past 240 hour(s)).     Radiology Studies: Dg Chest Port 1 View  Result Date: 04/14/2018 CLINICAL DATA:  Fever. EXAM: PORTABLE CHEST 1 VIEW COMPARISON:  Radiograph of April 10, 2018. FINDINGS: Stable cardiomegaly. No pneumothorax is noted. Mild bibasilar atelectasis and pleural effusions are noted. Bony thorax is unremarkable. IMPRESSION: Mild bibasilar subsegmental atelectasis and pleural effusions. Electronically Signed   By: Marijo Conception, M.D.   On: 04/14/2018 14:06      Scheduled Meds: . dexamethasone  4 mg Intravenous Q12H  .  ergocalciferol  5,000 Units Per Tube Daily  . feeding supplement (PRO-STAT SUGAR FREE 64)  30 mL Per Tube BID  . fentaNYL  100 mcg Transdermal Q72H  . free water  100 mL Per Tube Q4H  . guaiFENesin  30 mL Oral BID  . pantoprazole sodium  40 mg Per Tube Daily  . polyethylene glycol  17 g Per Tube Daily  . potassium chloride  20 mEq Oral BID  .  ranitidine  150 mg Per Tube QHS  . senna-docusate  1 tablet Oral BID  . tamsulosin  0.4 mg Oral Daily   Continuous Infusions: . feeding supplement (OSMOLITE 1.5 CAL) 1,000 mL (04/15/18 0840)     LOS: 15 days    Time spent: 45 minutes   Dessa Phi, DO [facility] [email or web address] Password [email or web address] 04/15/2018, 1:58 PM

## 2018-04-15 NOTE — Progress Notes (Signed)
LCSW following for disposition.   Patient not medically stable for dc.   LCSW will continue to follow.   [name] [place] [facility] [place]

## 2018-04-15 NOTE — Progress Notes (Signed)
  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 At the beginning of his treatments, his daughter noted that his pain improved. Daughter reported that the patient would go to low intensity rehab to regain strength prior to possible immunotherapy. On PE, heart and lungs normal and patient had good strength to BLE. Towards the end of his treatment, he reported nausea (patient with feeding tube) and a cough. The patient denied pain, fatigue, diarrhea, skin issues, SOB. On PE, it was noted that the patient had a Duragesic patch in place.    Plan: The patient has completed radiation treatment. The patient will return to radiation oncology clinic for routine followup in one month. I advised them to call or return sooner if they have any questions or concerns related to their recovery or treatment.  -----------------------------------  Blair Promise, PhD, MD  This document serves as a record of services personally performed by Gery Pray, MD. It was created on his behalf by [facility], a trained medical scribe. The creation of this record is based on the scribe's personal observations and the provider's statements to them. This document has been checked and approved by the attending provider.

## 2018-04-15 NOTE — Plan of Care (Signed)
  Problem: Nutrition: Goal: Adequate nutrition will be maintained 04/15/2018 0850 by Dorene Sorrow, RN Outcome: Progressing 04/14/2018 2022 by Dorene Sorrow, RN Outcome: Progressing   Problem: Pain Managment: Goal: General experience of comfort will improve 04/15/2018 0850 by Dorene Sorrow, RN Outcome: Progressing 04/14/2018 2022 by Dorene Sorrow, RN Outcome: Progressing   Problem: Safety: Goal: Ability to remain free from injury will improve 04/15/2018 0850 by Dorene Sorrow, RN Outcome: Progressing 04/14/2018 2022 by Dorene Sorrow, RN Outcome: Progressing

## 2018-04-15 NOTE — Progress Notes (Signed)
Physical Therapy Discharge Patient Details Name: [name] MRN: [id number] DOB: 29-Jul-1931 Today's Date: 04/15/2018 Time:  -     Patient discharged from PT services secondary to medical decline - per  Family and  MD notes, patient to move toward comfort care measres.   Please see latest therapy progress note for current level of functioning and progress toward goals.     GP    Tresa Endo PT [place] 04/15/2018, 1:45 PM

## 2018-04-15 NOTE — Progress Notes (Signed)
Events in the last few days noted.  Patient's condition and clinical status appears to be declining.  His overall prognosis is poor with a limited life expectancy.  The natural course of his lung cancer was reviewed today with the patient and his family.  He has stage IV disease with poor performance status that can only palliated by systemic chemotherapy which he is not a candidate for.  I have recommended proceeding with hospice care with consideration of comfort care moving forward.  There will be no plans for any anticancer treatments at this time or in the future.  Anticipate he has limited life expectancy of less than 2 months.  I shared this information with the patient and his family and answered all their questions.

## 2018-04-15 NOTE — Progress Notes (Signed)
Daily Progress Note   Patient Name: [name]       Date: 04/15/2018 DOB: 05/19/1931  Age: 84 y.o. MRN#: [id number] Attending Physician: Dessa Phi, DO Primary Care Physician: Biagio Borg, MD Admit Date: 03/30/2018  Reason for Consultation/Follow-up: Establishing goals of care and Psychosocial/spiritual support  Subjective: Patient is in bed resting. Easily aroused when name called. Denies pain at this time but reports pain throughout the day and night. Feels it is somewhat better managed. Reports he now hurt all over at times. Family at bedside (wife and daughter). Daughter requested a follow-up meeting today and wanted to have updates from Dr. Alen Blew (Oncologist). I was able to speak to his RN today and daughter verbalized that he came in this afternoon and spoke with them. Patient and family verbalized that he was not a candidate for immunotherapy per Dr. Alen Blew and his recommendations were for comfort measures and hospice. Patient is accepting of this news and states he is prepared to die. He reports "I have been talking to God for the past week and he has told me that my time on this earth is up and to say my goodbyes!" Wife and daughter tearful during conversation. They verbalized that he had been saying that to them over the past few days and even directed them as far as his burial wishes. Family states they were just waiting to ultimately here that he was not a candidate for further therapy by Oncology before coming to terms with his wishes.   Family states they are familiar with [facility] facility. We discussed briefly the philosophy of [facility] and hospice and the goal of keeping patient comfortable and allowing a peaceful end-of-life transition. We discussed what comfort care  would look like. Family is aware that aggressive measures such as continuous IV fluids, lab work, radiology exams, rehospitalization, and nonsymptomatic medications may be discontinued. They are aware the goal is to keep patient comfortable and not allow him to suffer which is what he has expressed. Wife and daughter verbalized wanting patient to be allowed tube feed boluses the first few days in hospice care for comfort and peace of mind. We discussed this would be at the discretion of the facility if accepted and if they agreed that it would definitely only be allowed for a brief time period.  Family verbalized understanding and their agreement. He asked about being able to eat or drink by mouth if he wanted. He is aware that he is allowed comfort feeds just as he was here with thicken liquids, with awareness that he is at high risk of aspiration. The verbalized awareness that patient would receive symptom management such as for nausea, vomiting, anxiety, pain, or shortness of breath. Patient agreed and states this is what he would like. He does not want to go home under hospice care unless it is absolutely required because he knows his family would not be able to deal with it emotionally and physically, despite having hospice support. Family and patient aware that life expectancy at [facility] is generally 2 weeks or less. They verbalized understanding.   Chart Reviewed and report received from bedside RN.   Length of Stay: 15  Current Medications: Scheduled Meds:  . dexamethasone  4 mg Intravenous Q12H  . ergocalciferol  5,000 Units Per Tube Daily  . feeding supplement (PRO-STAT SUGAR FREE 64)  30 mL Per Tube BID  . fentaNYL  100 mcg Transdermal Q72H  . free water  100 mL Per Tube Q4H  . guaiFENesin  30 mL Oral BID  . pantoprazole sodium  40 mg Per Tube Daily  . polyethylene glycol  17 g Per Tube Daily  . potassium chloride  20 mEq Oral BID  . ranitidine  150 mg Per Tube QHS  . senna-docusate  1  tablet Oral BID  . tamsulosin  0.4 mg Oral Daily    Continuous Infusions: . feeding supplement (OSMOLITE 1.5 CAL) 1,000 mL (04/15/18 0840)    PRN Meds: acetaminophen, acetaminophen, acetylcysteine, albuterol, bisacodyl, butamben-tetracaine-benzocaine, HYDROmorphone (DILAUDID) injection, iopamidol, LORazepam, morphine, ondansetron **OR** ondansetron (ZOFRAN) IV, RESOURCE THICKENUP CLEAR  Physical Exam  Constitutional: He is oriented to person, place, and time. He appears cachectic. He is cooperative. He appears ill.  Thin and frail in appearance   HENT:  TEP in place   Cardiovascular: Normal rate, regular rhythm and normal heart sounds. Exam reveals decreased pulses.  Pulmonary/Chest: Effort normal. He has decreased breath sounds.  Trach collar   Abdominal: Bowel sounds are normal.  PEG tube   Neurological: He is alert and oriented to person, place, and time. He displays atrophy.  Skin: Skin is warm, dry and intact.  Psychiatric: He has a normal mood and affect. His speech is normal and behavior is normal. Judgment and thought content normal. Cognition and memory are normal.  TEP in place, able to communicate   Nursing note and vitals reviewed.           Vital Signs: BP (!) 130/51 (BP Location: Left Arm)   Pulse 92   Temp 99.9 F (37.7 C) (Oral)   Resp 18   Ht 5' 9.02" (1.753 m)   Wt 80.8 kg (178 lb 2.1 oz)   SpO2 96%   BMI 26.29 kg/m  SpO2: SpO2: 96 % O2 Device: O2 Device: Tracheostomy Collar O2 Flow Rate: O2 Flow Rate (L/min): 10 L/min  Intake/output summary:   Intake/Output Summary (Last 24 hours) at 04/15/2018 1556 Last data filed at 04/15/2018 1518 Gross per 24 hour  Intake 1885 ml  Output 900 ml  Net 985 ml   LBM: Last BM Date: 04/10/18 Baseline Weight: Weight: 84.1 kg (185 lb 6.5 oz) Most recent weight: Weight: 80.8 kg (178 lb 2.1 oz)       Palliative Assessment/Data: PPS 20 % (Jevity feeds)    Flowsheet Rows  Most Recent Value  Intake Tab    Referral Department  Hospitalist  Unit at Time of Referral  Oncology Unit  Palliative Care Primary Diagnosis  Cancer  Date Notified  04/02/18  Palliative Care Type  New Palliative care  Reason for referral  End of Life Care Assistance  Date of Admission  03/30/18  Date first seen by Palliative Care  04/03/18  # of days Palliative referral response time  1 Day(s)  # of days IP prior to Palliative referral  3  Clinical Assessment  Psychosocial & Spiritual Assessment  Palliative Care Outcomes      Patient Active Problem List   Diagnosis Date Noted  . Malnutrition of moderate degree 04/06/2018  . Hypernatremia 04/01/2018  . Acute respiratory failure with hypoxia ([place]) 03/31/2018  . Acute GI bleeding 03/31/2018  . Acute blood loss anemia 03/31/2018  . Acute respiratory failure ([place]) 03/31/2018  . HCAP (healthcare-associated pneumonia) 03/31/2018  . Metastatic cancer to bone ([place]) 03/10/2018  . Left lumbar radiculopathy 02/12/2018  . Hypothyroidism 08/05/2017  . Abnormal TSH 01/29/2017  . Dizziness 04/12/2016  . Back pain 01/20/2015  . Insomnia 01/20/2015  . Malignant neoplasm of prostate ([place]) 02/22/2014  . Hematochezia 12/10/2011  . Abdominal pain, other specified site 12/10/2011  . Laryngeal cancer ([place]) 11/10/2011  . Diarrhea 11/10/2011  . Impaired glucose tolerance   . Preventative health care 11/03/2011  . Hyperlipidemia 08/29/2008  . ANXIETY 08/29/2008  . Essential hypertension 08/29/2008  . ALLERGIC RHINITIS 08/29/2008  . DIVERTICULOSIS, COLON 08/29/2008  . GERD 08/20/2007  . BENIGN PROSTATIC HYPERTROPHY 08/20/2007    Palliative Care Assessment & Plan   Patient Profile: 84 y.o. male admitted on 03/30/2018 from home with complaints of shortness of breath with increased tracheal tube secretions and weakness with inability to walk. Patient has a significant medical history for laryngeal carcinoma status post tracheostomy (2009), hypertension, prostate cancer (2015)  s/p radiation, GERD, back pain, and most recently diagnosed with left lower lobe lung cancer with metastatic lymphadenopathy and L3 vertebral body. He was brought to ER by his family (wife and daughter). Patient was recently placed on steroids but reports his appetite has been poor over the past few days prior to admission. He denied chest pain or abdominal pain. During ER course he was debrile, tachycardic, and hypoxic and placed on 100% non-rebreather.  CT angiogram of the chest shows bilateral consolidation. Following the CT scan patient had increased secretions coming out of the tracheal stoma and NG tube placed shows frank blood aspirated. Patient became more hypoxic desaturating on 100% nonrebreather. Daughter did not want patient intubated. Since admission patient is maintaining saturations with trach collar, he has been seen by Dr. 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 Patient and family has opted to proceed with immunotherapy if his medical status improves and to continue wth radiation. Palliative Medicine consulted for goals of care discussion.   Assessment: Patient is Alert and oriented x3. Lethargic at times. Able to communicate with TEP in place. Cachetic with very min. Input by mouth. (Thicken liquids) Patient is aware of no further oncology interventions and would like to transition to comfort care at discharge with hospice home placement. Patient verbalized acceptance for end-of-life care. He has shows of decline over the past week. Pain is more well controlled to date. Patient continues to eat very minimum by mouth. He is severely deconditioned and unable to get out  of bed independently or ambulate due to weakness and fatigue.    Recommendations/Plan:  DNR/DNI-as confirmed by patient and family   Continue treating the treatable while hospitalized. Family has received requested updates  today by Dr. Alen Blew (Oncology). They are aware that his recommendations are for hospice care and comfort measures given his continuous decline and no further treatment options available. Family has accepted the prognosis and prepared to proceed with end-of-life measures and comfort care at discharge. Patient is requesting [place] facility for this care if available. He feels that once he leaves the hospital and feedings are discontinued he would not survive past 2 weeks and states continuously that he is prepared and ready to leave this world.   CSW consult has been placed for [facility]. Bernette is aware of this request.   Continue with Fentanyl 100 mcg patch and Dilaudid 1mg  PRN for pain.   Continue with Ativan PRN for anxiety/agitation  Palliative Medicine team will continue to support patient, family, and medical team during hospitalization.   Goals of Care and Additional Recommendations:  Limitations on Scope of Treatment: Initiate Comfort Feeding, No Diagnostics and No Lab Draws. Continue with current care while hospitalized with expectations of discharging with comfort measures in place only.   Code Status:    Code Status Orders  (From admission, onward)        Start     Ordered   04/03/18 1646  Do not attempt resuscitation (DNR)  Continuous    Question Answer Comment  In the event of cardiac or respiratory ARREST Do not call a "code blue"   In the event of cardiac or respiratory ARREST Do not perform Intubation, CPR, defibrillation or ACLS   In the event of cardiac or respiratory ARREST Use medication by any route, position, wound care, and other measures to relive pain and suffering. May use oxygen, suction and manual treatment of airway obstruction as needed for comfort.      04/03/18 1645    Code Status History    Date Active Date Inactive Code Status Order ID Comments User Context   03/31/2018 1113 04/03/2018 1645 Full Code [id number]  Charlynne Cousins, MD  Inpatient   03/31/2018 0550 03/31/2018 1113 DNR [id number]  Rise Patience, MD ED      Prognosis:   Oncology has given prognosis of aprox 6 weeks, however given patient wanting to forego nutrition via PEG and has very minimum to no po intake his prognosis would be most likely 2 weeks or less-in the setting of little to no po intake, discontinuation of tube feedings, deconditioning, immobility, cachetic, moderate protein calorie malnutrition, hypotension, acute respiratory failure, HCAP, fevers of unknown origin, dysphagia, stage IV squamous cell carcinoma of the lung with metastasis to the bone, L3 transverse, no oncology medical intervention, hypernatremia, hx of prostate and larynx cancer s/p laryngectomy (TEP in place), and aspiration.    Discharge Planning:  Hospice facility-family is requesting [facility].   Care plan was discussed with patient, patient's family (wife and daughter), bedside RN, Quincy Sheehan, CSW, and Dr. Maylene Roes.   Thank you for allowing the Palliative Medicine Team to assist in the care of this patient.   Time In: 1430 Time Out: 1535 Total Time 65 min.  Prolonged Time Billed YES       Greater than 50%  of this time was spent counseling and coordinating care related to the above assessment and plan.  Alda Lea, NP-BC Palliative Medicine Team  Phone: [phone] Fax: [phone]  Pager: [phone] Amion: Bjorn Pippin   Please contact Palliative Medicine Team phone at [phone] for questions and concerns.

## 2018-04-16 LAB — URINE CULTURE: Culture: >=100000 — AB

## 2018-04-16 LAB — GLUCOSE, CAPILLARY
Glucose-Capillary: 103 mg/dL — ABNORMAL HIGH (ref 70–99)
Glucose-Capillary: 121 mg/dL — ABNORMAL HIGH (ref 70–99)
Glucose-Capillary: 84 mg/dL (ref 70–99)
Glucose-Capillary: 92 mg/dL (ref 70–99)

## 2018-04-16 NOTE — Progress Notes (Signed)
Speech Language Pathology Discharge Patient Details Name: [name] MRN: [id number] DOB: Aug 10, 1931 Today's Date: 04/16/2018 Time:  -     Patient discharged from SLP services secondary to orders discontinued.  Please see latest therapy progress note for current level of functioning and progress toward goals.  Please reconsult if needs arise.  Celia B. Quentin Ore [facility], CCC-SLP Speech Language Pathologist [phone]  [name] 04/16/2018, 9:53 AM

## 2018-04-16 NOTE — Progress Notes (Signed)
Received request from RN regarding gastrostomy tube. Patient had gastrostomy tube placed 04/10/2018 by Dr. Annamaria Boots.  RN reports that multiple RNs have been unable to administer tube feedings due to clogging of tube. Multiple flushes given to gastrostomy tube which flushed and aspirated without resistance. Reviewed with RN.  Plan to continue with Qshift flushes. Please call IR with questions/concers.  Bea Graff Jalyah Weinheimer, PA-C 04/16/2018, 2:04 PM

## 2018-04-16 NOTE — Progress Notes (Signed)
Informed by off going RN the PEG tube was clogged after admin of viokace.  Pt's daughter demanded that someone on call from IR come intervene on PEG tube tonight.  MD was notified.  MD stated that the case was non-emergent so ir will intervene in the am. MD stated that any meds that could be switched to iv could be switched per pharmacy. Pharm MD was contacted and was unable to switch meds due to the inability of alternate routes.

## 2018-04-16 NOTE — Discharge Summary (Addendum)
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 Patient presented to the emergency department with progressive weakness, shortness of breath and increased secretions through his tracheal tube. CT chest showed multilobar pneumonia and patient was admitted with working diagnosis of acute respiratory failure due to healthcare associated pneumonia. During hospital stay, he hadbiopsy which confirmed the diagnosis of a squamous cell. Hospitalization was complicated with aspiration pneumonia, concern of leak through the TEP (tracheoesophageal voice prosthesis) placing patient on high risk for aspiration. Patient was treated with antibiotics. TEP was changed on 7/25.  He underwent PEG placement for feeding and malnutrition. He has had a prolonged hospitalization and after discussion with oncology, who felt that there would be no further treatment options for patient's cancer, family and patient decided to enroll with residential hospice.   Discharge Diagnoses:  Principal Problem:   Acute respiratory failure with hypoxia (HCC) Active Problems:   Essential hypertension   Hypothyroidism   Metastatic cancer to bone (HCC)   Acute blood loss anemia   Acute respiratory failure (HCC)   HCAP (healthcare-associated pneumonia)   Hypernatremia   Malnutrition of moderate degree  Acute resp failure with hypoxia due to HCAP, worsened with aspiration during hosp stay  UTI   Moderate malnutrition due dysphagia and chronic diseases Stage IV squamous cell carcinoma of the lung with metastasis to the bone, L3 transverse process Essential hypertension   Discharge Instructions   Allergies as of 04/16/2018      Reactions   Terazosin Other (See Comments)   DIZZINESS   Levothyroxine Other (See Comments)      Medication List    STOP taking these medications   amLODipine 5 MG tablet Commonly known as:  NORVASC   aspirin 81 MG tablet   dexamethasone 4 MG tablet Commonly known as:  DECADRON   HYDROcodone-acetaminophen 7.5-325 MG tablet Commonly known as:  NORCO   lisinopril 40 MG tablet Commonly known as:  PRINIVIL,ZESTRIL   morphine 30 MG 12 hr tablet Commonly known as:  MS CONTIN   MULTIVITAMIN ADULT PO   oxyCODONE 5 MG immediate release tablet Commonly known as:  Oxy IR/ROXICODONE   pantoprazole 40 MG tablet Commonly known as:  PROTONIX   predniSONE 10 MG tablet Commonly known as:  DELTASONE   ranitidine 150 MG capsule Commonly known as:  ZANTAC   senna-docusate 8.6-50 MG tablet Commonly known as:  SENNA S   tamsulosin 0.4 MG Caps capsule Commonly known as:  FLOMAX   tiZANidine 2 MG tablet Commonly known as:  ZANAFLEX   Vitamin D (Ergocalciferol) 50000 units Caps capsule Commonly known as:  DRISDOL       Allergies  Allergen Reactions  . Terazosin Other (See Comments)    DIZZINESS  . Levothyroxine Other (See Comments)    Consultations:  Oncology  Palliative care  IR    Procedures/Studies: Dg Chest 1 View  Result Date: 04/10/2018 CLINICAL DATA:  Hypoxia. EXAM: CHEST  1 VIEW COMPARISON:  04/06/2018. FINDINGS: Tracheostomy cuff for oxygenation. Cardiomegaly. BILATERAL pulmonary opacities are redemonstrated  may be slightly improved. BILATERAL pleural effusions appear increased. No pneumothorax. IMPRESSION: BILATERAL pulmonary opacities are redemonstrated, slightly improved. BILATERAL pleural effusions appear increased.  Electronically Signed   By: Staci Righter M.D.   On: 04/10/2018 06:55   Dg Chest 1 View  Result Date: 04/06/2018 CLINICAL DATA:  Acute respiratory failure with hypoxia EXAM: CHEST  1 VIEW COMPARISON:  Chest x-ray of March 31, 2018 and chest CT scan of the same date. FINDINGS: The lungs are well-expanded. There is alveolar infiltrate inferiorly in the right upper lobe and in the left lower lobe. There is a small left pleural effusion. There is pleural based soft tissue density overlying the lateral aspect of the left seventh and eighth ribs which is not new. The heart is mildly enlarged. The pulmonary vascularity is not engorged. There is tortuosity of the descending thoracic aorta. IMPRESSION: Bilateral pneumonia. The findings in the right lung have worsened since the previous study. The findings on the left are stable to slightly less conspicuous. Electronically Signed   By: David  Martinique M.D.   On: 04/06/2018 13:25   Dg Chest 1 View  Result Date: 03/31/2018 CLINICAL DATA:  84 year old male with weakness.  Tracheostomy. EXAM: CHEST  1 VIEW COMPARISON:  PET-CT dated 03/17/2018 FINDINGS: A tracheostomy is noted. Bilateral mid to lower lung field densities likely combination of pleural effusions and associated atelectatic changes. Pneumonia is not excluded. Clinical correlation is recommended. The left lower lobe mass seen on the prior PET-CT is not well visualized on this radiograph. There is no pneumothorax. The cardiac silhouette is within normal limits. No acute osseous pathology. IMPRESSION: Bilateral pleural effusions with associated atelectatic changes of the lower lobes. Pneumonia is not excluded. Clinical correlation is recommended. Electronically Signed   By: Anner Crete M.D.   On: 03/31/2018 01:26   Dg Abdomen 1 View  Result Date: 03/31/2018 CLINICAL DATA:  84 year old male status post NG tube placement. EXAM: ABDOMEN - 1 VIEW COMPARISON:  Chest CTA 0400 hours today. FINDINGS: Portable AP  supine view at 0433 hours. Enteric tube courses from the mediastinum to the epigastrium. Side hole is at the level of the gastroesophageal junction. Stable visible lung bases. Excreted IV contrast in nondilated bilateral renal collecting systems. Visualized bowel gas pattern is non obstructed. No acute osseous abnormality identified. IMPRESSION: 1. Enteric tube courses to the stomach. Side hole is at the GEJ level, advance 5 cm to ensure side hole placement within the stomach. 2. Nonobstructed visible bowel gas pattern. Nonobstructed kidneys with IV contrast excretion. Electronically Signed   By: Genevie Ann M.D.   On: 03/31/2018 05:09   Ct Angio Chest Pe W Or Wo Contrast  Result Date: 03/31/2018 CLINICAL DATA:  84 year old male with dyspnea. EXAM: CT ANGIOGRAPHY CHEST WITH CONTRAST TECHNIQUE: Multidetector CT imaging of the chest was performed using the standard protocol during bolus administration of intravenous contrast. Multiplanar CT image reconstructions and MIPs were obtained to evaluate the vascular anatomy. CONTRAST:  140mL ISOVUE-370 IOPAMIDOL (ISOVUE-370) INJECTION 76% COMPARISON:  Chest radiograph dated 03/31/2018 and PET-CT dated 03/17/2018. FINDINGS: Cardiovascular: There is no cardiomegaly or pericardial effusion. The thoracic aorta is unremarkable. Probable diminutive left vertebral artery. The remainder of the visualized origins of the great vessels of the aortic arch appear patent. Evaluation of the pulmonary arteries is very limited due to respiratory motion artifact as well as streak artifact caused by patient's arms. No large or central pulmonary artery embolus identified. Mediastinum/Nodes: Bilateral hilar adenopathy measure 15 mm on the right  approximately 15 mm on the left. A 13 mm rounded prevascular lymph node noted. Esophagus is grossly unremarkable. No mediastinal fluid collection. Lungs/Pleura: There is a small left pleural effusion. There is near complete consolidative changes of the  left lower lobe as well as the large area of consolidative change with air bronchogram in the right lower lobe. Additional airspace density in the right upper lobe. These consolidative areas are new compared to the prior CT most consistent with multifocal pneumonia. The previously seen left lower lobe mass is not well visualized due to consolidative changes of the left lower lobe. There is a background of emphysema. There is a 4 mm left upper lobe subpleural nodule. There is no pneumothorax. A tracheostomy is noted. There is high-grade narrowing of the left lower lobe bronchus as well as narrowing of the right lower lobe bronchus. This is likely related to mass effect caused by hilar adenopathy. Upper Abdomen: Right renal upper pole cyst. The visualized upper abdomen is otherwise unremarkable. Musculoskeletal: T8 sclerotic lesion seen on the PET-CT. There is osteopenia. Scattered lytic lesions involving T6 with associated mild compression of the superior endplate consistent with a pathologic fracture. There is lytic metastatic disease involving T11 and T12 as well as sternum. These lesions are better seen on the prior PET-CT. Review of the MIP images confirms the above findings. IMPRESSION: 1. Very limited evaluation of the pulmonary arteries due to respiratory motion artifact. No large or central pulmonary artery embolus. 2. Large areas of consolidation involving the lower lobes as well as right upper lobe most consistent with pneumonia. There is high-grade narrowing of the left lower lobe bronchus as well as narrowing of the right lower lobe bronchus likely related to mass effect caused by hilar adenopathy. 3. Small left pleural effusion increased in size compared to the PET-CT of 03/17/2018. 4. Multiple osseous lytic lesions with minimally depressed pathology fracture of the superior endplate of T6. 5.  Emphysema (ICD10-J43.9). Electronically Signed   By: Anner Crete M.D.   On: 03/31/2018 04:47   Ir  Gastrostomy Tube Mod Sed  Result Date: 04/10/2018 INDICATION: Metastatic squamous cell carcinoma, dysphagia, malnutrition EXAM: FLUOROSCOPIC 20 FRENCH PULL-THROUGH GASTROSTOMY Date:  04/10/2018 04/10/2018 4:19 pm Radiologist:  Jerilynn Mages. Daryll Brod, MD Guidance:  Fluoroscopic MEDICATIONS: Ancef 2 gAntibiotics were administered within 1 hour of the procedure. Glucagon 0.5 mg IV ANESTHESIA/SEDATION: Versed 1.5 mg IV; Fentanyl 75 mcg IV Moderate Sedation Time:  20 minutes The patient was continuously monitored during the procedure by the interventional radiology nurse under my direct supervision. CONTRAST:  5 cc Isovue-300-administered into the gastric lumen. FLUOROSCOPY TIME:  Fluoroscopy Time: 8 minutes 0 seconds (198 mGy). COMPLICATIONS: None immediate. PROCEDURE: Informed consent was obtained from the patient following explanation of the procedure, risks, benefits and alternatives. The patient understands, agrees and consents for the procedure. All questions were addressed. A time out was performed. Maximal barrier sterile technique utilized including caps, mask, sterile gowns, sterile gloves, large sterile drape, hand hygiene, and betadine prep. The left upper quadrant was sterilely prepped and draped. An oral gastric catheter was inserted into the stomach under fluoroscopy. The existing nasogastric feeding tube was removed. Air was injected into the stomach for insufflation and visualization under fluoroscopy. The air distended stomach was confirmed beneath the anterior abdominal wall in the frontal and lateral projections. Under sterile conditions and local anesthesia, a 28 gauge trocar needle was utilized to access the stomach percutaneously beneath the left subcostal margin. Needle position was confirmed within the stomach  under biplane fluoroscopy. Contrast injection confirmed position also. A single T tack was deployed for gastropexy. Over an Amplatz guide wire, a 9-French sheath was inserted into the stomach. A  snare device was utilized to capture the oral gastric catheter. The snare device was pulled retrograde from the stomach up the esophagus and out the oropharynx. The 20-French pull-through gastrostomy was connected to the snare device and pulled antegrade through the oropharynx down the esophagus into the stomach and then through the percutaneous tract external to the patient. The gastrostomy was assembled externally. Contrast injection confirms position in the stomach. Images were obtained for documentation. The patient tolerated procedure well. No immediate complication. IMPRESSION: Fluoroscopic insertion of a 20-French "pull-through" gastrostomy. Electronically Signed   By: Jerilynn Mages.  Shick M.D.   On: 04/10/2018 16:34   Ct Biopsy  Result Date: 04/01/2018 INDICATION: 84 year old with history of prostate and laryngeal cancer. The patient now has lung lesions and evidence for metastatic disease. Tissue diagnosis is needed. Lytic lesion involving the L3 left transverse process was targeted for biopsy. EXAM: CT-GUIDED CORE BIOPSY OF THE L3 TRANSVERSE PROCESS LESION MEDICATIONS: None. ANESTHESIA/SEDATION: Moderate (conscious) sedation was employed during this procedure. A total of Versed 1 mg and Fentanyl 25 mcg was administered intravenously. Moderate Sedation Time: 10 minutes. The patient's level of consciousness and vital signs were monitored continuously by radiology nursing throughout the procedure under my direct supervision. FLUOROSCOPY TIME:  None COMPLICATIONS: None immediate. PROCEDURE: Informed written consent was obtained from the patient after a thorough discussion of the procedural risks, benefits and alternatives. All questions were addressed. A timeout was performed prior to the initiation of the procedure. Patient was placed on his left side. CT images through the lumbar spine were obtained. The L3 left transverse process lesion was identified and targeted for biopsy. The back was prepped with  chlorhexidine and sterile field was created. Skin and soft tissues were anesthetized with 1% lidocaine. 17 gauge coaxial needle was directed into the expansile lytic lesion with CT guidance. Core biopsies were obtained with an 18 gauge device. Specimens placed in formalin. Coaxial needle was removed without complication. Bandage placed over the puncture site. FINDINGS: Expansile lytic lesion involving the L3 left transverse process. Needle position confirmed within the lesion. Adequate core biopsies were placed in formalin. IMPRESSION: CT-guided core biopsies of the L3 left transverse process lytic lesion. Electronically Signed   By: [name] M.D.   On: 04/01/2018 20:17   Dg Chest Port 1 View  Result Date: 04/14/2018 CLINICAL DATA:  Fever. EXAM: PORTABLE CHEST 1 VIEW COMPARISON:  Radiograph of April 10, 2018. FINDINGS: Stable cardiomegaly. No pneumothorax is noted. Mild bibasilar atelectasis and pleural effusions are noted. Bony thorax is unremarkable. IMPRESSION: Mild bibasilar subsegmental atelectasis and pleural effusions. Electronically Signed   By: Marijo Conception, M.D.   On: 04/14/2018 14:06   Dg Abd Portable 1v  Result Date: 04/07/2018 CLINICAL DATA:  Feeding tube placement. EXAM: PORTABLE ABDOMEN - 1 VIEW COMPARISON:  03/31/2018.  CT 03/17/2018. FINDINGS: Feeding tube noted with its tip over the distal stomach. Distended loops of small and large bowel noted suggesting adynamic ileus. No free air. Bibasilar atelectasis/infiltrates and left-sided pleural effusion. IMPRESSION: 1.  Feeding tube noted with its tip over the distal stomach. 2. Distended loops of small and large bowel most consistent with adynamic ileus. Follow-up exam to demonstrate resolution suggested. 3. Bibasilar atelectasis/infiltrates and left-sided pleural effusion. Electronically Signed   By: Marcello Moores  Register   On: 04/07/2018 16:31  Nm Pet (axumin) Skull Base To Mid Thigh  Result Date: 03/18/2018 CLINICAL DATA:  Prostate  carcinoma with biochemical recurrence. Personal history of laryngeal carcinoma. EXAM: NUCLEAR MEDICINE PET SKULL BASE TO THIGH TECHNIQUE: 10.2 mCi F-18 Fluciclovine was injected intravenously. Full-ring PET imaging was performed from the skull base to thigh after the radiotracer. CT data was obtained and used for attenuation correction and anatomic localization. COMPARISON:  Lumbar MRI on 02/26/2018 FINDINGS: NECK No radiotracer activity in neck lymph nodes. Incidental CT finding: None CHEST A 5.4 cm mass is seen in the posterior left lower lobe which shows radiotracer uptake. This is highly suspicious for primary bronchogenic carcinoma. There are other scattered sub-cm pulmonary nodule seen in both lungs which show no radiotracer uptake, but are suspicious for small pulmonary metastases. Moderate emphysema is also noted. A focus of radiotracer uptake is seen within the left lateral pleural space, consistent with pleural metastasis. Radiotracer uptake is seen within mild left hilar lymphadenopathy. Mild mediastinal lymphadenopathy is also seen subcarinal, AP window, lateral aortic, and right paratracheal regions which shows radiotracer uptake. Incidental CT finding: None ABDOMEN/PELVIS Prostate: Several fiducial markers are seen within the prostate which is mildly enlarged. Mild to moderate diffuse radiotracer uptake is seen prostate gland. Lymph nodes: No abnormal radiotracer accumulation within pelvic or abdominal nodes. Liver: No evidence of liver metastasis Incidental CT finding: None SKELETON Foci of increased radiotracer uptake are seen throughout the spine, sternum, and pelvis, which correspond to lytic bone lesions on CT. These are not typical for prostate carcinoma, and are suspicious for metastatic lung carcinoma or possibly laryngeal carcinoma. IMPRESSION: Diffuse lytic bone metastases, not typical for prostate carcinoma, and instead suspicious for metastatic lung or possibly laryngeal carcinoma. 5.4 cm  lung mass in posterior left lower lobe, highly suspicious for primary bronchogenic carcinoma. Metastatic lymphadenopathy in left hilum and bilateral mediastinum. Left lateral pleural based metastasis and tiny pleural effusion. These results will be called to the ordering clinician or representative by the Radiologist Assistant, and communication documented in the PACS or zVision Dashboard. Electronically Signed   By: Earle Gell M.D.   On: 03/18/2018 08:39      Discharge Exam: Vitals:   04/16/18 0813 04/16/18 1158  BP:    Pulse: 85 85  Resp: 18 18  Temp:    SpO2: (!) 88% 90%    General: [name] alert, awake, not in acute distress, weak and frail appearing  Cardiovascular: RRR, S1/S2 +, no rubs, no gallops Respiratory: CTA bilaterally, no wheezing, no rhonchi, +Trach collar  Abdominal: Soft, NT, ND, bowel sounds present, +PEG  Extremities: no edema, no cyanosis    The results of significant diagnostics from this hospitalization (including imaging, microbiology, ancillary and laboratory) are listed below for reference.     Microbiology: Recent Results (from the past 240 hour(s))  Culture, Urine     Status: Abnormal   Collection Time: 04/14/18 12:12 PM  Result Value Ref Range Status   Specimen Description   Final    URINE, CLEAN CATCH Performed at [facility], [place] [address], [place], [place] [address]    Special Requests   Final    NONE Performed at [facility], [place] [address] [place], [place] [address]    Culture >=100,000 COLONIES/mL YEAST (A)  Final   Report Status 04/16/2018 FINAL  Final     Labs: BNP (last 3 results) Recent Labs    03/31/18 0010  BNP 70.3   Basic Metabolic Panel: Recent Labs  Lab 04/11/18 0403 04/13/18 0428 04/15/18 0423  NA 136 133* 135  K 3.6 4.4 4.5  CL 97* 97* 98  CO2 29 28 30   GLUCOSE 127* 152* 176*  BUN 16 19 23   CREATININE 0.62 0.64 0.59*  CALCIUM 8.7* 8.7* 9.0  MG 2.1 2.0 2.0  PHOS  --   2.9 2.8   Liver Function Tests: Recent Labs  Lab 04/13/18 0428  ALBUMIN 1.7*   No results for input(s): LIPASE, AMYLASE in the last 168 hours. No results for input(s): AMMONIA in the last 168 hours. CBC: Recent Labs  Lab 04/10/18 0415 04/11/18 0403 04/13/18 0428 04/14/18 0418 04/15/18 0423  WBC 19.1* 17.0* 16.4* 16.1* 18.5*  NEUTROABS 17.4* 16.0*  --   --  17.0*  HGB 8.6* 8.5* 8.4* 8.0* 7.9*  HCT 27.6* 26.7* 26.7* 25.5* 25.3*  MCV 84.1 83.2 82.7 84.4 84.1  PLT 211 237 245 228 236   Cardiac Enzymes: No results for input(s): CKTOTAL, CKMB, CKMBINDEX, TROPONINI in the last 168 hours. BNP: Invalid input(s): POCBNP CBG: Recent Labs  Lab 04/15/18 2024 04/16/18 0016 04/16/18 0435 04/16/18 0748 04/16/18 1145  GLUCAP 97 103* 121* 84 92   D-Dimer No results for input(s): DDIMER in the last 72 hours. Hgb A1c No results for input(s): HGBA1C in the last 72 hours. Lipid Profile No results for input(s): CHOL, HDL, LDLCALC, TRIG, CHOLHDL, LDLDIRECT in the last 72 hours. Thyroid function studies No results for input(s): TSH, T4TOTAL, T3FREE, THYROIDAB in the last 72 hours.  Invalid input(s): FREET3 Anemia work up No results for input(s): VITAMINB12, FOLATE, FERRITIN, TIBC, IRON, RETICCTPCT in the last 72 hours. Urinalysis    Component Value Date/Time   COLORURINE YELLOW 03/31/2018 0141   APPEARANCEUR CLEAR 03/31/2018 0141   LABSPEC 1.024 03/31/2018 0141   PHURINE 5.0 03/31/2018 0141   GLUCOSEU NEGATIVE 03/31/2018 0141   GLUCOSEU NEGATIVE 03/10/2018 1140   HGBUR SMALL (A) 03/31/2018 0141   BILIRUBINUR NEGATIVE 03/31/2018 0141   KETONESUR 20 (A) 03/31/2018 0141   PROTEINUR 30 (A) 03/31/2018 0141   UROBILINOGEN 1.0 03/10/2018 1140   NITRITE NEGATIVE 03/31/2018 0141   LEUKOCYTESUR NEGATIVE 03/31/2018 0141   Sepsis Labs Invalid input(s): PROCALCITONIN,  WBC,  LACTICIDVEN Microbiology Recent Results (from the past 240 hour(s))  Culture, Urine     Status: Abnormal    Collection Time: 04/14/18 12:12 PM  Result Value Ref Range Status   Specimen Description   Final    URINE, CLEAN CATCH Performed at [facility], [place] [address], [place], [place] [address]    Special Requests   Final    NONE Performed at [facility], [place] [address], [place], [place] [address]    Culture >=100,000 COLONIES/mL YEAST (A)  Final   Report Status 04/16/2018 FINAL  Final     Patient was seen and examined on the day of discharge and was found to be in stable condition. Time coordinating discharge: 35 minutes including assessment and coordination of care, as well as examination of the patient.   SIGNED:  Dessa Phi, DO [facility] Pager [phone]  If 7PM-7AM, please contact night-coverage [email or web address] Password [email or web address] 04/16/2018, 1:09 PM

## 2018-04-16 NOTE — Progress Notes (Signed)
RN called [facility] and reported given to [employer]. No questions or concerns voiced. Pt to leave by PTAR. Will continue to monitor pt until transportation arrives.

## 2018-04-16 NOTE — Progress Notes (Signed)
Patient to dc to [facility].   LCSW faxed dc summary to facility.   Patient will transport by PTAR.   RN report number: [phone]  Servando Snare, [place] [phone]

## 2018-04-16 NOTE — Progress Notes (Signed)
LCSW made referral to [facility] for residential hospice.   LCSW will continue to follow for dc planning.   Carolin Coy [place]

## 2018-04-16 NOTE — Progress Notes (Signed)
[facility] RN note.  Received request from Princella Ion for family interest in [facility] with request for transfer today. Chart reviewed and eligibility approved.  Met with patient and family to confirm interest and explain services. Family agreeable to transfer today. CSW aware.  Registration paper work completed.. Dr. Cathlean Cower to assume care per family request. Please fax discharge summary to [phone]. RN please call report to [phone]. Please arrange transport for patient to arrive before noon if possible.  Thank you,   Farrel Gordon, RN, [facility] Liaison  [place] are on AMION

## 2018-04-17 ENCOUNTER — Telehealth: Payer: Self-pay

## 2018-04-17 NOTE — Telephone Encounter (Signed)
Pt on TCM list and admitted for acute resp failure with hypoxia due to HCAP, worsened with aspiration during hosp stay, UTI, Malnutrition due to dysphagia and chronic diseases. Pt d/c'ed on 04/16/2018 to Hospice.

## 2018-04-20 LAB — CULTURE, BLOOD (ROUTINE X 2)
Culture: NO GROWTH
Culture: NO GROWTH
SPECIAL REQUESTS: ADEQUATE
SPECIAL REQUESTS: ADEQUATE

## 2018-04-22 ENCOUNTER — Encounter (HOSPITAL_COMMUNITY): Payer: Self-pay | Admitting: Oncology

## 2018-05-04 ENCOUNTER — Telehealth: Payer: Self-pay

## 2018-05-04 NOTE — Telephone Encounter (Signed)
On 04/22/18, a D/C was received from [facility]. The D/C is for a bural. The Patient is a patient of Dr. Jenny Reichmann. The D/C will be taken to Primary [facility] at [facility] for [facility].   On 05/05/18 I received the D/C back from Dr. Jenny Reichmann. I got the signed D/C ready and called the funeral home to let them know the D/C is ready for pickup.

## 2018-05-11 ENCOUNTER — Ambulatory Visit: Payer: Medicare Other | Admitting: Internal Medicine

## 2018-05-12 ENCOUNTER — Telehealth: Payer: Self-pay | Admitting: Internal Medicine

## 2018-05-12 NOTE — Telephone Encounter (Signed)
Called, LVM with side b fax numer.

## 2018-05-12 NOTE — Telephone Encounter (Signed)
Copied from [place] [id number]. Topic: Inquiry >> Apr 28, 2018  3:51 PM Vernona Rieger wrote: Reason for CRM: Lockie Mola from [facility] called and said she faxed over an order to Dr Jenny Reichmann on 6/5, 7/24 and today. She is not getting a response back. Please advise.

## 2018-05-14 ENCOUNTER — Ambulatory Visit: Payer: Medicare Other | Admitting: Radiation Oncology

## 2018-05-17 DEATH — deceased

## 2018-08-06 ENCOUNTER — Ambulatory Visit: Payer: Medicare Other | Admitting: Internal Medicine

## 2019-08-26 IMAGING — CT CT ANGIO CHEST
2 of 6 series · 16 of 46 positions shown · IV contrast (ISOVUE 370)
Comparison: Chest radiograph dated 03/31/2018 and PET-CT dated
03/17/2018.

CLINICAL DATA: 87-year-old male with dyspnea.

EXAM:
CT ANGIOGRAPHY CHEST WITH CONTRAST
TECHNIQUE: Multidetector CT imaging of the chest was performed using the
standard protocol during bolus administration of intravenous
contrast. Multiplanar CT image reconstructions and MIPs were
obtained to evaluate the vascular anatomy.
CONTRAST:  100mL [id number] IOPAMIDOL (CW1JAW-JH5) INJECTION 76%

[Series 5: thins · axial · 0.80mm/px · z∈[-274,-1]mm · 13 of 300 slices shown]
[im 14/300  lung]
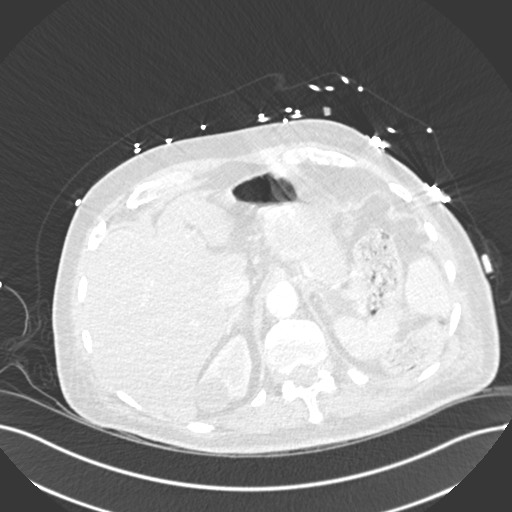
[im 40/300  soft-tissue]
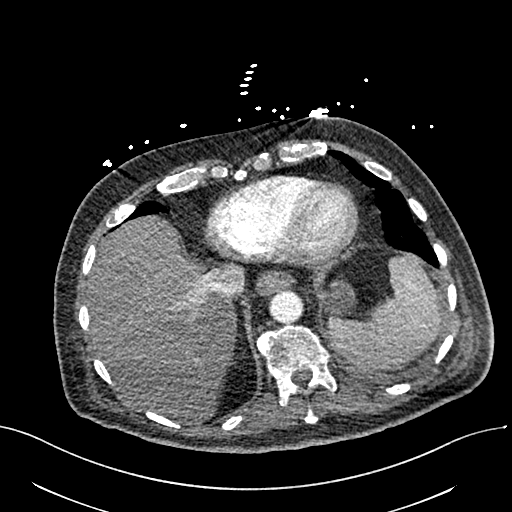
[im 66/300  lung]
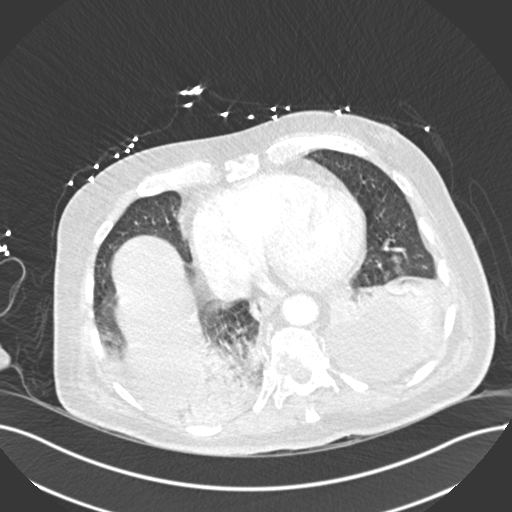
[im 79/300  soft-tissue]
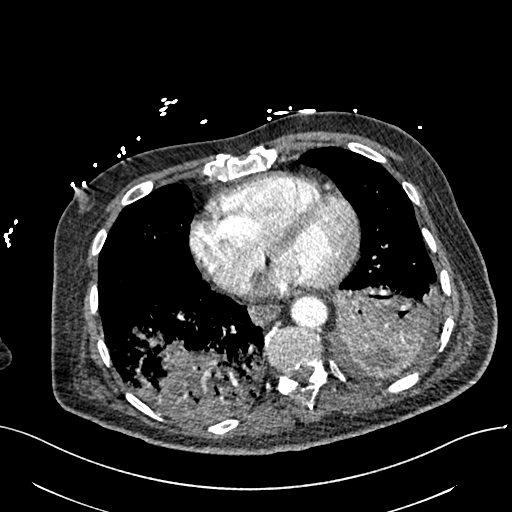
[im 105/300  lung]
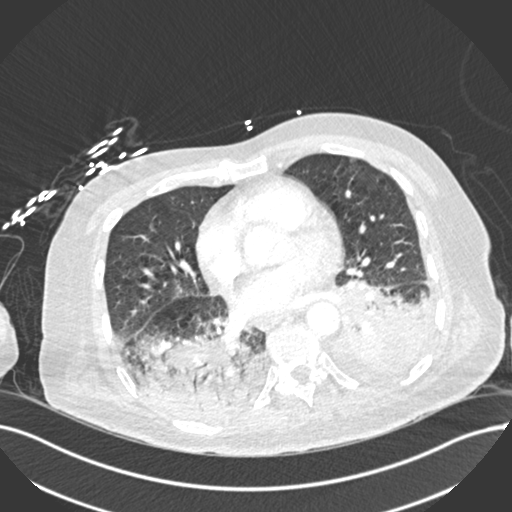
[im 131/300  soft-tissue]
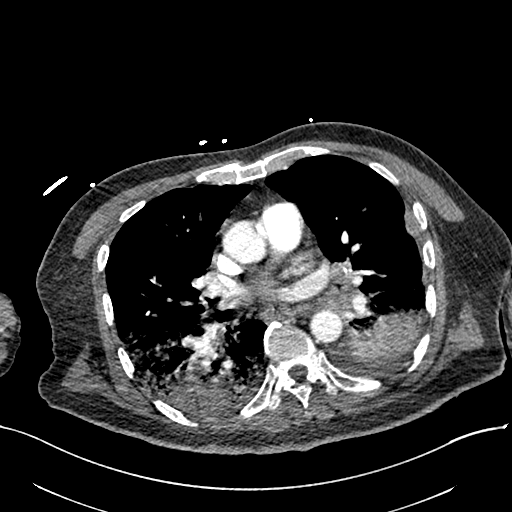
[im 157/300  lung]
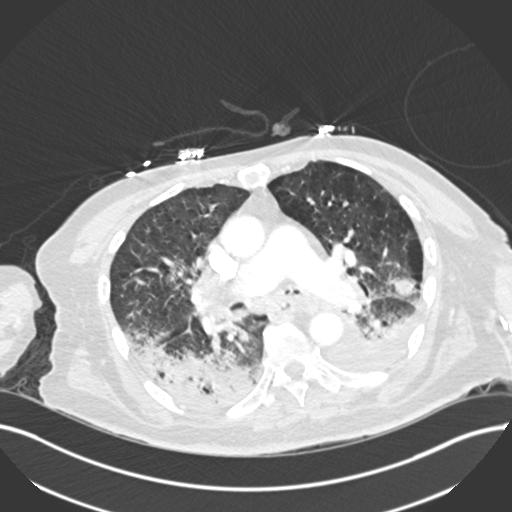
[im 170/300  soft-tissue]
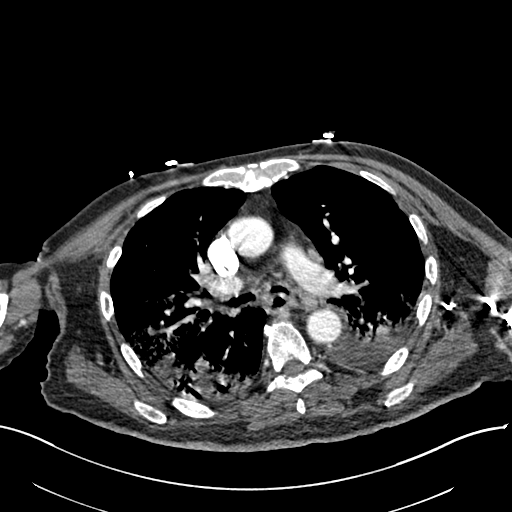
[im 196/300  lung]
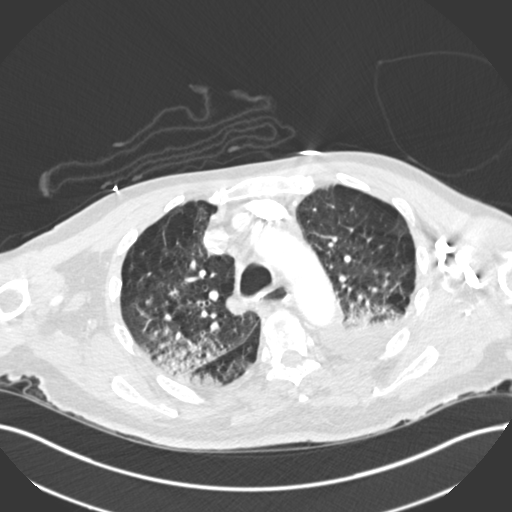
[im 222/300  soft-tissue]
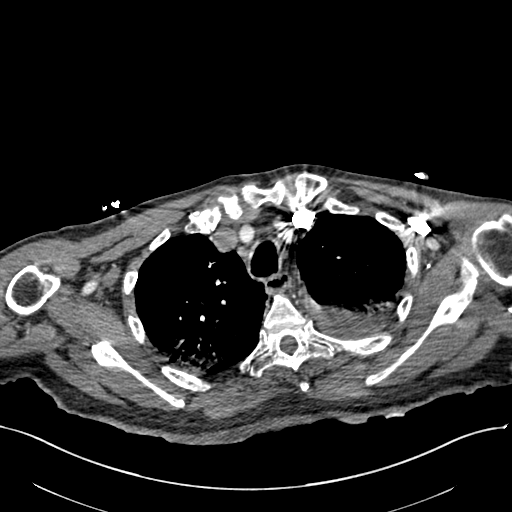
[im 235/300  lung]
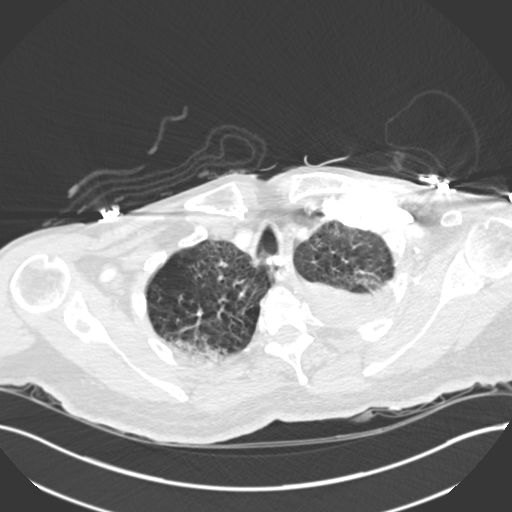
[im 261/300  soft-tissue]
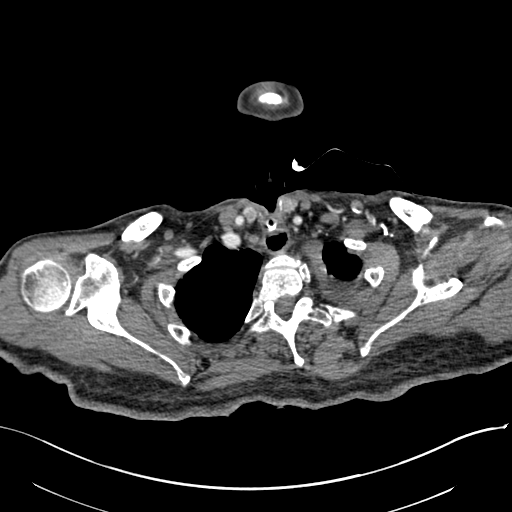
[im 287/300  lung]
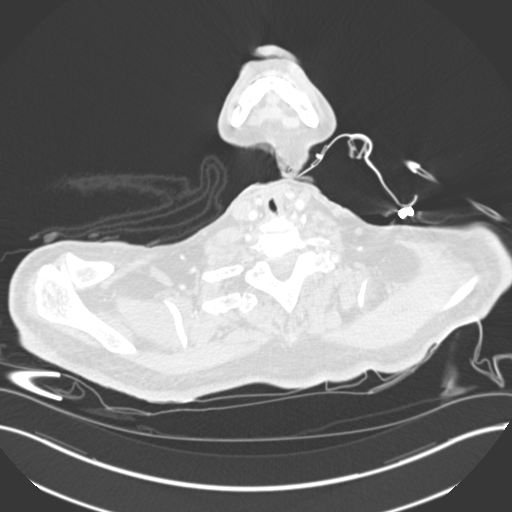

[Series 6: coronal mpr · coronal · 0.60mm/px · 3 of 150 slices shown]
[im 38/150  soft-tissue]
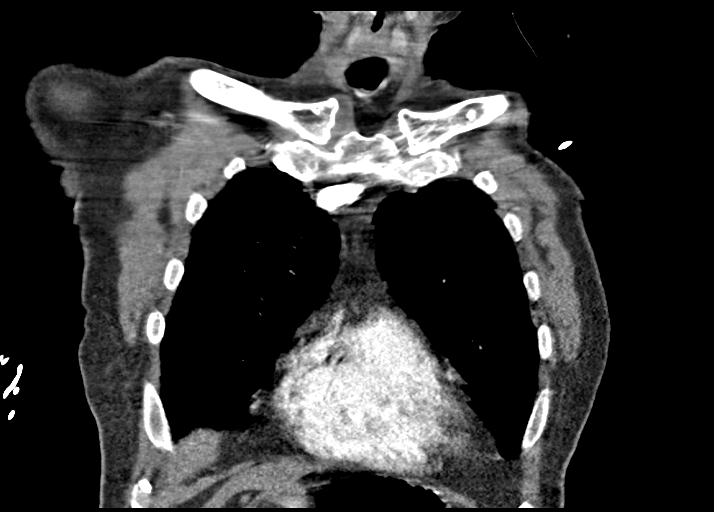
[im 75/150  soft-tissue]
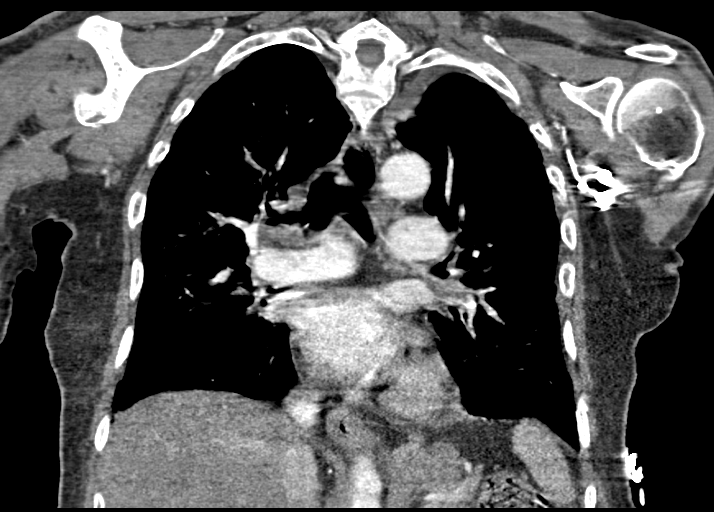
[im 112/150  soft-tissue]
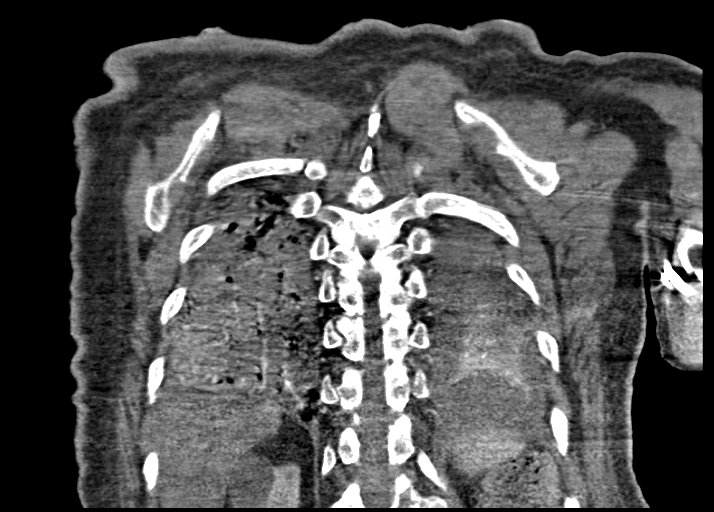

[16 of 46 positions shown; findings below may reference images not displayed]

FINDINGS: Cardiovascular: There is no cardiomegaly or pericardial effusion.
The thoracic aorta is unremarkable. Probable diminutive left
vertebral artery. The remainder of the visualized origins of the
great vessels of the aortic arch appear patent. Evaluation of the
pulmonary arteries is very limited due to respiratory motion
artifact as well as streak artifact caused by patient's arms. No
large or central pulmonary artery embolus identified.

Mediastinum/Nodes: Bilateral hilar adenopathy measure 15 mm on the
right approximately 15 mm on the left. A 13 mm rounded prevascular
lymph node noted. Esophagus is grossly unremarkable. No mediastinal
fluid collection.

Lungs/Pleura: There is a small left pleural effusion. There is near
complete consolidative changes of the left lower lobe as well as the
large area of consolidative change with air bronchogram in the right
lower lobe. Additional airspace density in the right upper lobe.
These consolidative areas are new compared to the prior CT most
consistent with multifocal pneumonia. The previously seen left lower
lobe mass is not well visualized due to consolidative changes of the
left lower lobe. There is a background of emphysema. There is a 4 mm
left upper lobe subpleural nodule. There is no pneumothorax. A
tracheostomy is noted. There is high-grade narrowing of the left
lower lobe bronchus as well as narrowing of the right lower lobe
bronchus. This is likely related to mass effect caused by hilar
adenopathy.

Upper Abdomen: Right renal upper pole cyst. The visualized upper
abdomen is otherwise unremarkable.

Musculoskeletal: T8 sclerotic lesion seen on the PET-CT. There is
osteopenia. Scattered lytic lesions involving T6 with associated
mild compression of the superior endplate consistent with a
pathologic fracture. There is lytic metastatic disease involving T11
and T12 as well as sternum. These lesions are better seen on the
prior PET-CT.

Review of the MIP images confirms the above findings.
IMPRESSION: 1. Very limited evaluation of the pulmonary arteries due to
respiratory motion artifact. No large or central pulmonary artery
embolus.
2. Large areas of consolidation involving the lower lobes as well as
right upper lobe most consistent with pneumonia. There is high-grade
narrowing of the left lower lobe bronchus as well as narrowing of
the right lower lobe bronchus likely related to mass effect caused
by hilar adenopathy.
3. Small left pleural effusion increased in size compared to the
PET-CT of 03/17/2018.
4. Multiple osseous lytic lesions with minimally depressed pathology
fracture of the superior endplate of T6.
5.  Emphysema ([id number]).
